# Patient Record
Sex: Male | Born: 1935
Health system: Southern US, Community
[De-identification: ages and names within clinical notes are randomized; demographics above are authoritative.]

## PROBLEM LIST (undated history)

## (undated) DIAGNOSIS — H348392 Tributary (branch) retinal vein occlusion, unspecified eye, stable: Secondary | ICD-10-CM

## (undated) DIAGNOSIS — I6930 Unspecified sequelae of cerebral infarction: Secondary | ICD-10-CM

## (undated) DIAGNOSIS — C61 Malignant neoplasm of prostate: Secondary | ICD-10-CM

## (undated) DIAGNOSIS — M51369 Other intervertebral disc degeneration, lumbar region without mention of lumbar back pain or lower extremity pain: Secondary | ICD-10-CM

## (undated) DIAGNOSIS — E041 Nontoxic single thyroid nodule: Secondary | ICD-10-CM

## (undated) DIAGNOSIS — E538 Deficiency of other specified B group vitamins: Secondary | ICD-10-CM

## (undated) DIAGNOSIS — G4733 Obstructive sleep apnea (adult) (pediatric): Secondary | ICD-10-CM

## (undated) DIAGNOSIS — E785 Hyperlipidemia, unspecified: Secondary | ICD-10-CM

## (undated) DIAGNOSIS — M19011 Primary osteoarthritis, right shoulder: Secondary | ICD-10-CM

## (undated) DIAGNOSIS — I6990 Unspecified sequelae of unspecified cerebrovascular disease: Secondary | ICD-10-CM

## (undated) DIAGNOSIS — R600 Localized edema: Secondary | ICD-10-CM

## (undated) DIAGNOSIS — H409 Unspecified glaucoma: Secondary | ICD-10-CM

## (undated) DIAGNOSIS — Z8546 Personal history of malignant neoplasm of prostate: Secondary | ICD-10-CM

## (undated) DIAGNOSIS — M5136 Other intervertebral disc degeneration, lumbar region: Secondary | ICD-10-CM

## (undated) HISTORY — DX: Primary osteoarthritis, right shoulder: M19.011

## (undated) HISTORY — DX: Malignant neoplasm of prostate: C61

## (undated) HISTORY — DX: Localized edema: R60.0

## (undated) HISTORY — DX: Other intervertebral disc degeneration, lumbar region without mention of lumbar back pain or lower extremity pain: M51.369

## (undated) HISTORY — DX: Unspecified sequelae of unspecified cerebrovascular disease: I69.90

## (undated) HISTORY — DX: Other intervertebral disc degeneration, lumbar region: M51.36

## (undated) HISTORY — DX: Nontoxic single thyroid nodule: E04.1

## (undated) HISTORY — DX: Deficiency of other specified B group vitamins: E53.8

## (undated) HISTORY — DX: Unspecified sequelae of cerebral infarction: I69.30

## (undated) HISTORY — PX: CATARACT EXTRACTION: SUR2

---
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
## 1999-01-11 HISTORY — PX: PROSTATECTOMY: SHX69

## 2005-08-12 DIAGNOSIS — I471 Supraventricular tachycardia, unspecified: Secondary | ICD-10-CM

## 2005-08-12 HISTORY — DX: Supraventricular tachycardia, unspecified: I47.10

## 2005-08-12 HISTORY — DX: Supraventricular tachycardia: I47.1

## 2010-04-01 HISTORY — PX: VIDEO ASSISTED THORACOSCOPY (VATS)/DECORTICATION: SHX6171

## 2010-07-17 ENCOUNTER — Inpatient Hospital Stay (HOSPITAL_COMMUNITY)
Admission: AD | Admit: 2010-07-17 | Payer: Medicare Other | Source: Other Acute Inpatient Hospital | Admitting: Cardiology

## 2012-07-16 DIAGNOSIS — H3581 Retinal edema: Secondary | ICD-10-CM

## 2012-07-16 DIAGNOSIS — Z961 Presence of intraocular lens: Secondary | ICD-10-CM

## 2012-07-16 DIAGNOSIS — H401132 Primary open-angle glaucoma, bilateral, moderate stage: Secondary | ICD-10-CM

## 2012-07-16 HISTORY — DX: Presence of intraocular lens: Z96.1

## 2012-07-16 HISTORY — DX: Retinal edema: H35.81

## 2012-07-16 HISTORY — DX: Primary open-angle glaucoma, bilateral, moderate stage: H40.1132

## 2013-12-09 DIAGNOSIS — H20023 Recurrent acute iridocyclitis, bilateral: Secondary | ICD-10-CM

## 2013-12-09 HISTORY — DX: Recurrent acute iridocyclitis, bilateral: H20.023

## 2014-04-05 DIAGNOSIS — Z6825 Body mass index (BMI) 25.0-25.9, adult: Secondary | ICD-10-CM | POA: Diagnosis not present

## 2014-04-05 DIAGNOSIS — I10 Essential (primary) hypertension: Secondary | ICD-10-CM | POA: Diagnosis not present

## 2014-04-07 DIAGNOSIS — H20023 Recurrent acute iridocyclitis, bilateral: Secondary | ICD-10-CM | POA: Diagnosis not present

## 2014-04-07 DIAGNOSIS — H3581 Retinal edema: Secondary | ICD-10-CM | POA: Diagnosis not present

## 2014-04-07 DIAGNOSIS — D3131 Benign neoplasm of right choroid: Secondary | ICD-10-CM | POA: Diagnosis not present

## 2014-04-07 DIAGNOSIS — Z961 Presence of intraocular lens: Secondary | ICD-10-CM | POA: Diagnosis not present

## 2014-04-07 DIAGNOSIS — H34832 Tributary (branch) retinal vein occlusion, left eye: Secondary | ICD-10-CM | POA: Diagnosis not present

## 2014-05-09 DIAGNOSIS — I10 Essential (primary) hypertension: Secondary | ICD-10-CM | POA: Diagnosis not present

## 2014-05-09 DIAGNOSIS — I471 Supraventricular tachycardia: Secondary | ICD-10-CM | POA: Diagnosis not present

## 2014-05-09 DIAGNOSIS — Z79899 Other long term (current) drug therapy: Secondary | ICD-10-CM | POA: Diagnosis not present

## 2014-05-09 DIAGNOSIS — I639 Cerebral infarction, unspecified: Secondary | ICD-10-CM | POA: Diagnosis not present

## 2014-05-09 DIAGNOSIS — E785 Hyperlipidemia, unspecified: Secondary | ICD-10-CM | POA: Diagnosis not present

## 2014-06-09 DIAGNOSIS — Z961 Presence of intraocular lens: Secondary | ICD-10-CM | POA: Diagnosis not present

## 2014-06-09 DIAGNOSIS — D3131 Benign neoplasm of right choroid: Secondary | ICD-10-CM | POA: Diagnosis not present

## 2014-06-09 DIAGNOSIS — H3581 Retinal edema: Secondary | ICD-10-CM | POA: Diagnosis not present

## 2014-06-09 DIAGNOSIS — H34832 Tributary (branch) retinal vein occlusion, left eye: Secondary | ICD-10-CM | POA: Diagnosis not present

## 2014-06-09 DIAGNOSIS — H20023 Recurrent acute iridocyclitis, bilateral: Secondary | ICD-10-CM | POA: Diagnosis not present

## 2014-06-22 DIAGNOSIS — Z961 Presence of intraocular lens: Secondary | ICD-10-CM | POA: Diagnosis not present

## 2014-06-22 DIAGNOSIS — H4011X0 Primary open-angle glaucoma, stage unspecified: Secondary | ICD-10-CM | POA: Diagnosis not present

## 2014-06-22 DIAGNOSIS — D3131 Benign neoplasm of right choroid: Secondary | ICD-10-CM | POA: Diagnosis not present

## 2014-08-10 DIAGNOSIS — I471 Supraventricular tachycardia: Secondary | ICD-10-CM | POA: Diagnosis not present

## 2014-08-10 DIAGNOSIS — E785 Hyperlipidemia, unspecified: Secondary | ICD-10-CM | POA: Diagnosis not present

## 2014-08-10 DIAGNOSIS — I639 Cerebral infarction, unspecified: Secondary | ICD-10-CM | POA: Diagnosis not present

## 2014-08-10 DIAGNOSIS — Z1389 Encounter for screening for other disorder: Secondary | ICD-10-CM | POA: Diagnosis not present

## 2014-08-10 DIAGNOSIS — I10 Essential (primary) hypertension: Secondary | ICD-10-CM | POA: Diagnosis not present

## 2014-08-10 DIAGNOSIS — Z9181 History of falling: Secondary | ICD-10-CM | POA: Diagnosis not present

## 2014-08-10 DIAGNOSIS — Z79899 Other long term (current) drug therapy: Secondary | ICD-10-CM | POA: Diagnosis not present

## 2014-08-29 DIAGNOSIS — N401 Enlarged prostate with lower urinary tract symptoms: Secondary | ICD-10-CM | POA: Diagnosis not present

## 2014-08-29 DIAGNOSIS — R972 Elevated prostate specific antigen [PSA]: Secondary | ICD-10-CM | POA: Diagnosis not present

## 2014-08-29 DIAGNOSIS — N3090 Cystitis, unspecified without hematuria: Secondary | ICD-10-CM | POA: Diagnosis not present

## 2014-09-09 DIAGNOSIS — R972 Elevated prostate specific antigen [PSA]: Secondary | ICD-10-CM | POA: Diagnosis not present

## 2014-09-19 DIAGNOSIS — Z6824 Body mass index (BMI) 24.0-24.9, adult: Secondary | ICD-10-CM | POA: Diagnosis not present

## 2014-09-19 DIAGNOSIS — J208 Acute bronchitis due to other specified organisms: Secondary | ICD-10-CM | POA: Diagnosis not present

## 2014-09-19 DIAGNOSIS — R0981 Nasal congestion: Secondary | ICD-10-CM | POA: Diagnosis not present

## 2014-09-29 DIAGNOSIS — Z961 Presence of intraocular lens: Secondary | ICD-10-CM | POA: Diagnosis not present

## 2014-09-29 DIAGNOSIS — H20023 Recurrent acute iridocyclitis, bilateral: Secondary | ICD-10-CM | POA: Diagnosis not present

## 2014-09-29 DIAGNOSIS — H3581 Retinal edema: Secondary | ICD-10-CM | POA: Diagnosis not present

## 2014-09-29 DIAGNOSIS — H34832 Tributary (branch) retinal vein occlusion, left eye: Secondary | ICD-10-CM | POA: Diagnosis not present

## 2014-09-29 DIAGNOSIS — D3131 Benign neoplasm of right choroid: Secondary | ICD-10-CM | POA: Diagnosis not present

## 2014-10-13 DIAGNOSIS — Z6823 Body mass index (BMI) 23.0-23.9, adult: Secondary | ICD-10-CM | POA: Diagnosis not present

## 2014-10-13 DIAGNOSIS — K645 Perianal venous thrombosis: Secondary | ICD-10-CM | POA: Diagnosis not present

## 2014-10-19 DIAGNOSIS — K625 Hemorrhage of anus and rectum: Secondary | ICD-10-CM | POA: Diagnosis not present

## 2014-10-19 DIAGNOSIS — K645 Perianal venous thrombosis: Secondary | ICD-10-CM | POA: Diagnosis not present

## 2014-10-19 DIAGNOSIS — K644 Residual hemorrhoidal skin tags: Secondary | ICD-10-CM | POA: Diagnosis not present

## 2014-10-19 DIAGNOSIS — K6289 Other specified diseases of anus and rectum: Secondary | ICD-10-CM | POA: Diagnosis not present

## 2014-10-26 DIAGNOSIS — D3131 Benign neoplasm of right choroid: Secondary | ICD-10-CM | POA: Diagnosis not present

## 2014-10-26 DIAGNOSIS — H4011X3 Primary open-angle glaucoma, severe stage: Secondary | ICD-10-CM | POA: Diagnosis not present

## 2014-10-26 DIAGNOSIS — Z961 Presence of intraocular lens: Secondary | ICD-10-CM | POA: Diagnosis not present

## 2014-10-26 DIAGNOSIS — H35372 Puckering of macula, left eye: Secondary | ICD-10-CM | POA: Diagnosis not present

## 2014-11-10 DIAGNOSIS — E785 Hyperlipidemia, unspecified: Secondary | ICD-10-CM | POA: Diagnosis not present

## 2014-11-10 DIAGNOSIS — I639 Cerebral infarction, unspecified: Secondary | ICD-10-CM | POA: Diagnosis not present

## 2014-11-10 DIAGNOSIS — I10 Essential (primary) hypertension: Secondary | ICD-10-CM | POA: Diagnosis not present

## 2014-11-10 DIAGNOSIS — I471 Supraventricular tachycardia: Secondary | ICD-10-CM | POA: Diagnosis not present

## 2014-11-10 DIAGNOSIS — Z1389 Encounter for screening for other disorder: Secondary | ICD-10-CM | POA: Diagnosis not present

## 2014-11-10 DIAGNOSIS — Z79899 Other long term (current) drug therapy: Secondary | ICD-10-CM | POA: Diagnosis not present

## 2014-11-16 DIAGNOSIS — K625 Hemorrhage of anus and rectum: Secondary | ICD-10-CM | POA: Diagnosis not present

## 2014-11-16 DIAGNOSIS — I10 Essential (primary) hypertension: Secondary | ICD-10-CM | POA: Diagnosis not present

## 2014-11-16 DIAGNOSIS — K644 Residual hemorrhoidal skin tags: Secondary | ICD-10-CM | POA: Diagnosis not present

## 2014-11-16 DIAGNOSIS — K6289 Other specified diseases of anus and rectum: Secondary | ICD-10-CM | POA: Diagnosis not present

## 2014-11-21 DIAGNOSIS — K649 Unspecified hemorrhoids: Secondary | ICD-10-CM | POA: Diagnosis not present

## 2014-11-21 DIAGNOSIS — Z79899 Other long term (current) drug therapy: Secondary | ICD-10-CM | POA: Diagnosis not present

## 2014-11-21 DIAGNOSIS — I517 Cardiomegaly: Secondary | ICD-10-CM | POA: Diagnosis not present

## 2014-11-21 DIAGNOSIS — K642 Third degree hemorrhoids: Secondary | ICD-10-CM | POA: Diagnosis not present

## 2014-11-21 DIAGNOSIS — I10 Essential (primary) hypertension: Secondary | ICD-10-CM | POA: Diagnosis not present

## 2014-11-21 DIAGNOSIS — Z7982 Long term (current) use of aspirin: Secondary | ICD-10-CM | POA: Diagnosis not present

## 2014-11-21 HISTORY — PX: HEMORROIDECTOMY: SUR656

## 2015-01-02 DIAGNOSIS — Z23 Encounter for immunization: Secondary | ICD-10-CM | POA: Diagnosis not present

## 2015-02-09 DIAGNOSIS — I471 Supraventricular tachycardia: Secondary | ICD-10-CM | POA: Diagnosis not present

## 2015-02-09 DIAGNOSIS — Z79899 Other long term (current) drug therapy: Secondary | ICD-10-CM | POA: Diagnosis not present

## 2015-02-09 DIAGNOSIS — E785 Hyperlipidemia, unspecified: Secondary | ICD-10-CM | POA: Diagnosis not present

## 2015-02-09 DIAGNOSIS — I639 Cerebral infarction, unspecified: Secondary | ICD-10-CM | POA: Diagnosis not present

## 2015-02-09 DIAGNOSIS — I10 Essential (primary) hypertension: Secondary | ICD-10-CM | POA: Diagnosis not present

## 2015-03-03 DIAGNOSIS — N3020 Other chronic cystitis without hematuria: Secondary | ICD-10-CM | POA: Diagnosis not present

## 2015-03-03 DIAGNOSIS — N402 Nodular prostate without lower urinary tract symptoms: Secondary | ICD-10-CM | POA: Diagnosis not present

## 2015-03-03 DIAGNOSIS — N401 Enlarged prostate with lower urinary tract symptoms: Secondary | ICD-10-CM | POA: Diagnosis not present

## 2015-03-03 DIAGNOSIS — R972 Elevated prostate specific antigen [PSA]: Secondary | ICD-10-CM | POA: Diagnosis not present

## 2015-04-06 DIAGNOSIS — H20023 Recurrent acute iridocyclitis, bilateral: Secondary | ICD-10-CM | POA: Diagnosis not present

## 2015-04-06 DIAGNOSIS — H3581 Retinal edema: Secondary | ICD-10-CM | POA: Diagnosis not present

## 2015-04-06 DIAGNOSIS — D3131 Benign neoplasm of right choroid: Secondary | ICD-10-CM | POA: Diagnosis not present

## 2015-04-06 DIAGNOSIS — H348392 Tributary (branch) retinal vein occlusion, unspecified eye, stable: Secondary | ICD-10-CM | POA: Diagnosis not present

## 2015-04-06 DIAGNOSIS — Z961 Presence of intraocular lens: Secondary | ICD-10-CM | POA: Diagnosis not present

## 2015-05-12 DIAGNOSIS — Z961 Presence of intraocular lens: Secondary | ICD-10-CM | POA: Diagnosis not present

## 2015-05-12 DIAGNOSIS — D3131 Benign neoplasm of right choroid: Secondary | ICD-10-CM | POA: Diagnosis not present

## 2015-05-12 DIAGNOSIS — H35372 Puckering of macula, left eye: Secondary | ICD-10-CM | POA: Diagnosis not present

## 2015-05-12 DIAGNOSIS — H401133 Primary open-angle glaucoma, bilateral, severe stage: Secondary | ICD-10-CM | POA: Diagnosis not present

## 2015-05-18 DIAGNOSIS — I10 Essential (primary) hypertension: Secondary | ICD-10-CM | POA: Diagnosis not present

## 2015-05-18 DIAGNOSIS — I471 Supraventricular tachycardia: Secondary | ICD-10-CM | POA: Diagnosis not present

## 2015-05-18 DIAGNOSIS — E785 Hyperlipidemia, unspecified: Secondary | ICD-10-CM | POA: Diagnosis not present

## 2015-05-18 DIAGNOSIS — M25511 Pain in right shoulder: Secondary | ICD-10-CM | POA: Diagnosis not present

## 2015-05-18 DIAGNOSIS — I639 Cerebral infarction, unspecified: Secondary | ICD-10-CM | POA: Diagnosis not present

## 2015-05-18 DIAGNOSIS — Z79899 Other long term (current) drug therapy: Secondary | ICD-10-CM | POA: Diagnosis not present

## 2015-05-24 DIAGNOSIS — M19011 Primary osteoarthritis, right shoulder: Secondary | ICD-10-CM | POA: Diagnosis not present

## 2015-06-15 DIAGNOSIS — Z6824 Body mass index (BMI) 24.0-24.9, adult: Secondary | ICD-10-CM | POA: Diagnosis not present

## 2015-06-15 DIAGNOSIS — I471 Supraventricular tachycardia: Secondary | ICD-10-CM | POA: Diagnosis not present

## 2015-06-15 DIAGNOSIS — Z0000 Encounter for general adult medical examination without abnormal findings: Secondary | ICD-10-CM | POA: Diagnosis not present

## 2015-06-15 DIAGNOSIS — E785 Hyperlipidemia, unspecified: Secondary | ICD-10-CM | POA: Diagnosis not present

## 2015-06-15 DIAGNOSIS — I10 Essential (primary) hypertension: Secondary | ICD-10-CM | POA: Diagnosis not present

## 2015-07-26 DIAGNOSIS — G4733 Obstructive sleep apnea (adult) (pediatric): Secondary | ICD-10-CM | POA: Diagnosis not present

## 2015-07-26 DIAGNOSIS — I471 Supraventricular tachycardia: Secondary | ICD-10-CM | POA: Diagnosis not present

## 2015-07-26 DIAGNOSIS — Z6824 Body mass index (BMI) 24.0-24.9, adult: Secondary | ICD-10-CM | POA: Diagnosis not present

## 2015-07-26 DIAGNOSIS — N401 Enlarged prostate with lower urinary tract symptoms: Secondary | ICD-10-CM | POA: Diagnosis not present

## 2015-08-07 DIAGNOSIS — G4733 Obstructive sleep apnea (adult) (pediatric): Secondary | ICD-10-CM | POA: Diagnosis not present

## 2015-08-15 DIAGNOSIS — J324 Chronic pansinusitis: Secondary | ICD-10-CM | POA: Diagnosis not present

## 2015-08-15 DIAGNOSIS — I639 Cerebral infarction, unspecified: Secondary | ICD-10-CM | POA: Diagnosis not present

## 2015-08-15 DIAGNOSIS — I10 Essential (primary) hypertension: Secondary | ICD-10-CM | POA: Diagnosis not present

## 2015-08-15 DIAGNOSIS — E785 Hyperlipidemia, unspecified: Secondary | ICD-10-CM | POA: Diagnosis not present

## 2015-08-15 DIAGNOSIS — Z79899 Other long term (current) drug therapy: Secondary | ICD-10-CM | POA: Diagnosis not present

## 2015-08-15 DIAGNOSIS — I471 Supraventricular tachycardia: Secondary | ICD-10-CM | POA: Diagnosis not present

## 2015-08-29 DIAGNOSIS — R972 Elevated prostate specific antigen [PSA]: Secondary | ICD-10-CM | POA: Diagnosis not present

## 2015-08-29 DIAGNOSIS — N3020 Other chronic cystitis without hematuria: Secondary | ICD-10-CM | POA: Diagnosis not present

## 2015-08-29 DIAGNOSIS — N402 Nodular prostate without lower urinary tract symptoms: Secondary | ICD-10-CM | POA: Diagnosis not present

## 2015-08-29 DIAGNOSIS — N318 Other neuromuscular dysfunction of bladder: Secondary | ICD-10-CM | POA: Diagnosis not present

## 2015-08-29 DIAGNOSIS — N401 Enlarged prostate with lower urinary tract symptoms: Secondary | ICD-10-CM | POA: Diagnosis not present

## 2015-08-31 DIAGNOSIS — G4733 Obstructive sleep apnea (adult) (pediatric): Secondary | ICD-10-CM | POA: Diagnosis not present

## 2015-09-04 DIAGNOSIS — G4733 Obstructive sleep apnea (adult) (pediatric): Secondary | ICD-10-CM | POA: Diagnosis not present

## 2015-09-04 DIAGNOSIS — R0683 Snoring: Secondary | ICD-10-CM | POA: Diagnosis not present

## 2015-09-18 DIAGNOSIS — G4733 Obstructive sleep apnea (adult) (pediatric): Secondary | ICD-10-CM | POA: Diagnosis not present

## 2015-10-02 DIAGNOSIS — G4733 Obstructive sleep apnea (adult) (pediatric): Secondary | ICD-10-CM | POA: Diagnosis not present

## 2015-10-12 DIAGNOSIS — H348392 Tributary (branch) retinal vein occlusion, unspecified eye, stable: Secondary | ICD-10-CM | POA: Diagnosis not present

## 2015-10-12 DIAGNOSIS — H3581 Retinal edema: Secondary | ICD-10-CM | POA: Diagnosis not present

## 2015-10-12 DIAGNOSIS — Z961 Presence of intraocular lens: Secondary | ICD-10-CM | POA: Diagnosis not present

## 2015-10-12 DIAGNOSIS — D3131 Benign neoplasm of right choroid: Secondary | ICD-10-CM | POA: Diagnosis not present

## 2015-10-30 DIAGNOSIS — G4733 Obstructive sleep apnea (adult) (pediatric): Secondary | ICD-10-CM | POA: Diagnosis not present

## 2015-10-30 DIAGNOSIS — R0683 Snoring: Secondary | ICD-10-CM | POA: Diagnosis not present

## 2015-11-02 DIAGNOSIS — G4733 Obstructive sleep apnea (adult) (pediatric): Secondary | ICD-10-CM | POA: Diagnosis not present

## 2015-11-10 DIAGNOSIS — H401133 Primary open-angle glaucoma, bilateral, severe stage: Secondary | ICD-10-CM | POA: Diagnosis not present

## 2015-11-21 DIAGNOSIS — I639 Cerebral infarction, unspecified: Secondary | ICD-10-CM | POA: Diagnosis not present

## 2015-11-21 DIAGNOSIS — I471 Supraventricular tachycardia: Secondary | ICD-10-CM | POA: Diagnosis not present

## 2015-11-21 DIAGNOSIS — I10 Essential (primary) hypertension: Secondary | ICD-10-CM | POA: Diagnosis not present

## 2015-11-21 DIAGNOSIS — Z1389 Encounter for screening for other disorder: Secondary | ICD-10-CM | POA: Diagnosis not present

## 2015-11-21 DIAGNOSIS — Z79899 Other long term (current) drug therapy: Secondary | ICD-10-CM | POA: Diagnosis not present

## 2015-11-21 DIAGNOSIS — E785 Hyperlipidemia, unspecified: Secondary | ICD-10-CM | POA: Diagnosis not present

## 2015-11-21 DIAGNOSIS — Z9181 History of falling: Secondary | ICD-10-CM | POA: Diagnosis not present

## 2015-11-27 DIAGNOSIS — N3020 Other chronic cystitis without hematuria: Secondary | ICD-10-CM | POA: Diagnosis not present

## 2015-11-27 DIAGNOSIS — N402 Nodular prostate without lower urinary tract symptoms: Secondary | ICD-10-CM | POA: Diagnosis not present

## 2015-11-27 DIAGNOSIS — N318 Other neuromuscular dysfunction of bladder: Secondary | ICD-10-CM | POA: Diagnosis not present

## 2015-11-27 DIAGNOSIS — R972 Elevated prostate specific antigen [PSA]: Secondary | ICD-10-CM | POA: Diagnosis not present

## 2015-12-03 DIAGNOSIS — G4733 Obstructive sleep apnea (adult) (pediatric): Secondary | ICD-10-CM | POA: Diagnosis not present

## 2015-12-05 DIAGNOSIS — N402 Nodular prostate without lower urinary tract symptoms: Secondary | ICD-10-CM | POA: Diagnosis not present

## 2015-12-05 DIAGNOSIS — N401 Enlarged prostate with lower urinary tract symptoms: Secondary | ICD-10-CM | POA: Diagnosis not present

## 2015-12-05 DIAGNOSIS — R972 Elevated prostate specific antigen [PSA]: Secondary | ICD-10-CM | POA: Diagnosis not present

## 2015-12-05 DIAGNOSIS — C61 Malignant neoplasm of prostate: Secondary | ICD-10-CM | POA: Diagnosis not present

## 2015-12-06 DIAGNOSIS — G4733 Obstructive sleep apnea (adult) (pediatric): Secondary | ICD-10-CM | POA: Diagnosis not present

## 2015-12-12 DIAGNOSIS — C61 Malignant neoplasm of prostate: Secondary | ICD-10-CM | POA: Diagnosis not present

## 2015-12-15 DIAGNOSIS — C61 Malignant neoplasm of prostate: Secondary | ICD-10-CM | POA: Diagnosis not present

## 2015-12-15 DIAGNOSIS — I700 Atherosclerosis of aorta: Secondary | ICD-10-CM | POA: Diagnosis not present

## 2015-12-15 DIAGNOSIS — N281 Cyst of kidney, acquired: Secondary | ICD-10-CM | POA: Diagnosis not present

## 2015-12-15 DIAGNOSIS — K7689 Other specified diseases of liver: Secondary | ICD-10-CM | POA: Diagnosis not present

## 2015-12-18 DIAGNOSIS — C61 Malignant neoplasm of prostate: Secondary | ICD-10-CM | POA: Diagnosis not present

## 2015-12-25 DIAGNOSIS — G4733 Obstructive sleep apnea (adult) (pediatric): Secondary | ICD-10-CM | POA: Diagnosis not present

## 2015-12-25 DIAGNOSIS — R5383 Other fatigue: Secondary | ICD-10-CM | POA: Diagnosis not present

## 2015-12-26 DIAGNOSIS — G4733 Obstructive sleep apnea (adult) (pediatric): Secondary | ICD-10-CM | POA: Diagnosis not present

## 2015-12-27 DIAGNOSIS — C61 Malignant neoplasm of prostate: Secondary | ICD-10-CM | POA: Diagnosis not present

## 2016-01-01 DIAGNOSIS — C61 Malignant neoplasm of prostate: Secondary | ICD-10-CM | POA: Diagnosis not present

## 2016-01-02 DIAGNOSIS — G4733 Obstructive sleep apnea (adult) (pediatric): Secondary | ICD-10-CM | POA: Diagnosis not present

## 2016-02-02 DIAGNOSIS — G4733 Obstructive sleep apnea (adult) (pediatric): Secondary | ICD-10-CM | POA: Diagnosis not present

## 2016-02-12 DIAGNOSIS — C61 Malignant neoplasm of prostate: Secondary | ICD-10-CM | POA: Diagnosis not present

## 2016-02-12 DIAGNOSIS — Z510 Encounter for antineoplastic radiation therapy: Secondary | ICD-10-CM | POA: Diagnosis not present

## 2016-02-14 DIAGNOSIS — C61 Malignant neoplasm of prostate: Secondary | ICD-10-CM | POA: Diagnosis not present

## 2016-02-14 DIAGNOSIS — Z510 Encounter for antineoplastic radiation therapy: Secondary | ICD-10-CM | POA: Diagnosis not present

## 2016-02-15 DIAGNOSIS — C61 Malignant neoplasm of prostate: Secondary | ICD-10-CM | POA: Diagnosis not present

## 2016-02-19 DIAGNOSIS — C61 Malignant neoplasm of prostate: Secondary | ICD-10-CM | POA: Diagnosis not present

## 2016-02-19 DIAGNOSIS — Z510 Encounter for antineoplastic radiation therapy: Secondary | ICD-10-CM | POA: Diagnosis not present

## 2016-02-26 DIAGNOSIS — Z510 Encounter for antineoplastic radiation therapy: Secondary | ICD-10-CM | POA: Diagnosis not present

## 2016-02-26 DIAGNOSIS — C61 Malignant neoplasm of prostate: Secondary | ICD-10-CM | POA: Diagnosis not present

## 2016-02-27 DIAGNOSIS — Z510 Encounter for antineoplastic radiation therapy: Secondary | ICD-10-CM | POA: Diagnosis not present

## 2016-02-27 DIAGNOSIS — C61 Malignant neoplasm of prostate: Secondary | ICD-10-CM | POA: Diagnosis not present

## 2016-02-28 DIAGNOSIS — Z23 Encounter for immunization: Secondary | ICD-10-CM | POA: Diagnosis not present

## 2016-02-28 DIAGNOSIS — I10 Essential (primary) hypertension: Secondary | ICD-10-CM | POA: Diagnosis not present

## 2016-02-28 DIAGNOSIS — C61 Malignant neoplasm of prostate: Secondary | ICD-10-CM | POA: Diagnosis not present

## 2016-02-28 DIAGNOSIS — Z510 Encounter for antineoplastic radiation therapy: Secondary | ICD-10-CM | POA: Diagnosis not present

## 2016-02-28 DIAGNOSIS — E785 Hyperlipidemia, unspecified: Secondary | ICD-10-CM | POA: Diagnosis not present

## 2016-02-28 DIAGNOSIS — I471 Supraventricular tachycardia: Secondary | ICD-10-CM | POA: Diagnosis not present

## 2016-02-28 DIAGNOSIS — I639 Cerebral infarction, unspecified: Secondary | ICD-10-CM | POA: Diagnosis not present

## 2016-02-28 DIAGNOSIS — Z79899 Other long term (current) drug therapy: Secondary | ICD-10-CM | POA: Diagnosis not present

## 2016-02-29 DIAGNOSIS — Z510 Encounter for antineoplastic radiation therapy: Secondary | ICD-10-CM | POA: Diagnosis not present

## 2016-02-29 DIAGNOSIS — C61 Malignant neoplasm of prostate: Secondary | ICD-10-CM | POA: Diagnosis not present

## 2016-03-01 DIAGNOSIS — Z510 Encounter for antineoplastic radiation therapy: Secondary | ICD-10-CM | POA: Diagnosis not present

## 2016-03-01 DIAGNOSIS — C61 Malignant neoplasm of prostate: Secondary | ICD-10-CM | POA: Diagnosis not present

## 2016-03-03 DIAGNOSIS — G4733 Obstructive sleep apnea (adult) (pediatric): Secondary | ICD-10-CM | POA: Diagnosis not present

## 2016-03-04 DIAGNOSIS — C61 Malignant neoplasm of prostate: Secondary | ICD-10-CM | POA: Diagnosis not present

## 2016-03-04 DIAGNOSIS — Z510 Encounter for antineoplastic radiation therapy: Secondary | ICD-10-CM | POA: Diagnosis not present

## 2016-03-05 DIAGNOSIS — C61 Malignant neoplasm of prostate: Secondary | ICD-10-CM | POA: Diagnosis not present

## 2016-03-05 DIAGNOSIS — Z510 Encounter for antineoplastic radiation therapy: Secondary | ICD-10-CM | POA: Diagnosis not present

## 2016-03-06 DIAGNOSIS — Z510 Encounter for antineoplastic radiation therapy: Secondary | ICD-10-CM | POA: Diagnosis not present

## 2016-03-06 DIAGNOSIS — C61 Malignant neoplasm of prostate: Secondary | ICD-10-CM | POA: Diagnosis not present

## 2016-03-07 DIAGNOSIS — C61 Malignant neoplasm of prostate: Secondary | ICD-10-CM | POA: Diagnosis not present

## 2016-03-07 DIAGNOSIS — Z510 Encounter for antineoplastic radiation therapy: Secondary | ICD-10-CM | POA: Diagnosis not present

## 2016-03-08 DIAGNOSIS — Z510 Encounter for antineoplastic radiation therapy: Secondary | ICD-10-CM | POA: Diagnosis not present

## 2016-03-08 DIAGNOSIS — C61 Malignant neoplasm of prostate: Secondary | ICD-10-CM | POA: Diagnosis not present

## 2016-03-11 DIAGNOSIS — Z510 Encounter for antineoplastic radiation therapy: Secondary | ICD-10-CM | POA: Diagnosis not present

## 2016-03-11 DIAGNOSIS — C61 Malignant neoplasm of prostate: Secondary | ICD-10-CM | POA: Diagnosis not present

## 2016-03-12 DIAGNOSIS — C61 Malignant neoplasm of prostate: Secondary | ICD-10-CM | POA: Diagnosis not present

## 2016-03-12 DIAGNOSIS — Z510 Encounter for antineoplastic radiation therapy: Secondary | ICD-10-CM | POA: Diagnosis not present

## 2016-03-13 DIAGNOSIS — Z510 Encounter for antineoplastic radiation therapy: Secondary | ICD-10-CM | POA: Diagnosis not present

## 2016-03-13 DIAGNOSIS — C61 Malignant neoplasm of prostate: Secondary | ICD-10-CM | POA: Diagnosis not present

## 2016-03-13 DIAGNOSIS — I10 Essential (primary) hypertension: Secondary | ICD-10-CM | POA: Diagnosis not present

## 2016-03-14 DIAGNOSIS — C61 Malignant neoplasm of prostate: Secondary | ICD-10-CM | POA: Diagnosis not present

## 2016-03-14 DIAGNOSIS — Z510 Encounter for antineoplastic radiation therapy: Secondary | ICD-10-CM | POA: Diagnosis not present

## 2016-03-15 DIAGNOSIS — Z510 Encounter for antineoplastic radiation therapy: Secondary | ICD-10-CM | POA: Diagnosis not present

## 2016-03-15 DIAGNOSIS — C61 Malignant neoplasm of prostate: Secondary | ICD-10-CM | POA: Diagnosis not present

## 2016-03-16 DIAGNOSIS — G4733 Obstructive sleep apnea (adult) (pediatric): Secondary | ICD-10-CM | POA: Diagnosis not present

## 2016-03-18 DIAGNOSIS — C61 Malignant neoplasm of prostate: Secondary | ICD-10-CM | POA: Diagnosis not present

## 2016-03-18 DIAGNOSIS — Z510 Encounter for antineoplastic radiation therapy: Secondary | ICD-10-CM | POA: Diagnosis not present

## 2016-03-19 DIAGNOSIS — C61 Malignant neoplasm of prostate: Secondary | ICD-10-CM | POA: Diagnosis not present

## 2016-03-19 DIAGNOSIS — Z510 Encounter for antineoplastic radiation therapy: Secondary | ICD-10-CM | POA: Diagnosis not present

## 2016-03-20 DIAGNOSIS — C61 Malignant neoplasm of prostate: Secondary | ICD-10-CM | POA: Diagnosis not present

## 2016-03-20 DIAGNOSIS — Z510 Encounter for antineoplastic radiation therapy: Secondary | ICD-10-CM | POA: Diagnosis not present

## 2016-03-21 DIAGNOSIS — C61 Malignant neoplasm of prostate: Secondary | ICD-10-CM | POA: Diagnosis not present

## 2016-03-21 DIAGNOSIS — Z510 Encounter for antineoplastic radiation therapy: Secondary | ICD-10-CM | POA: Diagnosis not present

## 2016-03-22 DIAGNOSIS — C61 Malignant neoplasm of prostate: Secondary | ICD-10-CM | POA: Diagnosis not present

## 2016-03-22 DIAGNOSIS — Z510 Encounter for antineoplastic radiation therapy: Secondary | ICD-10-CM | POA: Diagnosis not present

## 2016-03-26 DIAGNOSIS — C61 Malignant neoplasm of prostate: Secondary | ICD-10-CM | POA: Diagnosis not present

## 2016-03-26 DIAGNOSIS — Z510 Encounter for antineoplastic radiation therapy: Secondary | ICD-10-CM | POA: Diagnosis not present

## 2016-03-27 DIAGNOSIS — C61 Malignant neoplasm of prostate: Secondary | ICD-10-CM | POA: Diagnosis not present

## 2016-03-27 DIAGNOSIS — Z510 Encounter for antineoplastic radiation therapy: Secondary | ICD-10-CM | POA: Diagnosis not present

## 2016-03-28 DIAGNOSIS — Z510 Encounter for antineoplastic radiation therapy: Secondary | ICD-10-CM | POA: Diagnosis not present

## 2016-03-28 DIAGNOSIS — C61 Malignant neoplasm of prostate: Secondary | ICD-10-CM | POA: Diagnosis not present

## 2016-03-29 DIAGNOSIS — C61 Malignant neoplasm of prostate: Secondary | ICD-10-CM | POA: Diagnosis not present

## 2016-03-29 DIAGNOSIS — Z510 Encounter for antineoplastic radiation therapy: Secondary | ICD-10-CM | POA: Diagnosis not present

## 2016-04-02 DIAGNOSIS — C61 Malignant neoplasm of prostate: Secondary | ICD-10-CM | POA: Diagnosis not present

## 2016-04-02 DIAGNOSIS — Z510 Encounter for antineoplastic radiation therapy: Secondary | ICD-10-CM | POA: Diagnosis not present

## 2016-04-03 DIAGNOSIS — G4733 Obstructive sleep apnea (adult) (pediatric): Secondary | ICD-10-CM | POA: Diagnosis not present

## 2016-04-03 DIAGNOSIS — C61 Malignant neoplasm of prostate: Secondary | ICD-10-CM | POA: Diagnosis not present

## 2016-04-03 DIAGNOSIS — Z510 Encounter for antineoplastic radiation therapy: Secondary | ICD-10-CM | POA: Diagnosis not present

## 2016-04-04 DIAGNOSIS — Z510 Encounter for antineoplastic radiation therapy: Secondary | ICD-10-CM | POA: Diagnosis not present

## 2016-04-04 DIAGNOSIS — C61 Malignant neoplasm of prostate: Secondary | ICD-10-CM | POA: Diagnosis not present

## 2016-04-05 DIAGNOSIS — Z510 Encounter for antineoplastic radiation therapy: Secondary | ICD-10-CM | POA: Diagnosis not present

## 2016-04-05 DIAGNOSIS — C61 Malignant neoplasm of prostate: Secondary | ICD-10-CM | POA: Diagnosis not present

## 2016-04-08 DIAGNOSIS — Z510 Encounter for antineoplastic radiation therapy: Secondary | ICD-10-CM | POA: Diagnosis not present

## 2016-04-08 DIAGNOSIS — C61 Malignant neoplasm of prostate: Secondary | ICD-10-CM | POA: Diagnosis not present

## 2016-04-09 DIAGNOSIS — C61 Malignant neoplasm of prostate: Secondary | ICD-10-CM | POA: Diagnosis not present

## 2016-04-09 DIAGNOSIS — Z510 Encounter for antineoplastic radiation therapy: Secondary | ICD-10-CM | POA: Diagnosis not present

## 2016-04-10 DIAGNOSIS — C61 Malignant neoplasm of prostate: Secondary | ICD-10-CM | POA: Diagnosis not present

## 2016-04-10 DIAGNOSIS — Z510 Encounter for antineoplastic radiation therapy: Secondary | ICD-10-CM | POA: Diagnosis not present

## 2016-04-11 DIAGNOSIS — C61 Malignant neoplasm of prostate: Secondary | ICD-10-CM | POA: Diagnosis not present

## 2016-04-11 DIAGNOSIS — Z510 Encounter for antineoplastic radiation therapy: Secondary | ICD-10-CM | POA: Diagnosis not present

## 2016-04-12 DIAGNOSIS — Z510 Encounter for antineoplastic radiation therapy: Secondary | ICD-10-CM | POA: Diagnosis not present

## 2016-04-12 DIAGNOSIS — C61 Malignant neoplasm of prostate: Secondary | ICD-10-CM | POA: Diagnosis not present

## 2016-04-15 DIAGNOSIS — Z510 Encounter for antineoplastic radiation therapy: Secondary | ICD-10-CM | POA: Diagnosis not present

## 2016-04-15 DIAGNOSIS — C61 Malignant neoplasm of prostate: Secondary | ICD-10-CM | POA: Diagnosis not present

## 2016-04-16 DIAGNOSIS — Z510 Encounter for antineoplastic radiation therapy: Secondary | ICD-10-CM | POA: Diagnosis not present

## 2016-04-16 DIAGNOSIS — C61 Malignant neoplasm of prostate: Secondary | ICD-10-CM | POA: Diagnosis not present

## 2016-04-18 DIAGNOSIS — Z510 Encounter for antineoplastic radiation therapy: Secondary | ICD-10-CM | POA: Diagnosis not present

## 2016-04-18 DIAGNOSIS — C61 Malignant neoplasm of prostate: Secondary | ICD-10-CM | POA: Diagnosis not present

## 2016-04-19 DIAGNOSIS — Z510 Encounter for antineoplastic radiation therapy: Secondary | ICD-10-CM | POA: Diagnosis not present

## 2016-04-19 DIAGNOSIS — C61 Malignant neoplasm of prostate: Secondary | ICD-10-CM | POA: Diagnosis not present

## 2016-04-22 DIAGNOSIS — C61 Malignant neoplasm of prostate: Secondary | ICD-10-CM | POA: Diagnosis not present

## 2016-04-22 DIAGNOSIS — Z510 Encounter for antineoplastic radiation therapy: Secondary | ICD-10-CM | POA: Diagnosis not present

## 2016-04-24 DIAGNOSIS — Z510 Encounter for antineoplastic radiation therapy: Secondary | ICD-10-CM | POA: Diagnosis not present

## 2016-04-24 DIAGNOSIS — C61 Malignant neoplasm of prostate: Secondary | ICD-10-CM | POA: Diagnosis not present

## 2016-04-25 DIAGNOSIS — Z510 Encounter for antineoplastic radiation therapy: Secondary | ICD-10-CM | POA: Diagnosis not present

## 2016-04-25 DIAGNOSIS — C61 Malignant neoplasm of prostate: Secondary | ICD-10-CM | POA: Diagnosis not present

## 2016-04-25 DIAGNOSIS — G4733 Obstructive sleep apnea (adult) (pediatric): Secondary | ICD-10-CM | POA: Diagnosis not present

## 2016-04-27 DIAGNOSIS — Z23 Encounter for immunization: Secondary | ICD-10-CM | POA: Diagnosis not present

## 2016-05-04 DIAGNOSIS — G4733 Obstructive sleep apnea (adult) (pediatric): Secondary | ICD-10-CM | POA: Diagnosis not present

## 2016-05-06 DIAGNOSIS — N3020 Other chronic cystitis without hematuria: Secondary | ICD-10-CM | POA: Diagnosis not present

## 2016-05-06 DIAGNOSIS — C61 Malignant neoplasm of prostate: Secondary | ICD-10-CM | POA: Diagnosis not present

## 2016-05-06 DIAGNOSIS — R600 Localized edema: Secondary | ICD-10-CM | POA: Diagnosis not present

## 2016-05-06 DIAGNOSIS — I10 Essential (primary) hypertension: Secondary | ICD-10-CM | POA: Diagnosis not present

## 2016-05-17 DIAGNOSIS — H401133 Primary open-angle glaucoma, bilateral, severe stage: Secondary | ICD-10-CM | POA: Diagnosis not present

## 2016-05-17 DIAGNOSIS — D3131 Benign neoplasm of right choroid: Secondary | ICD-10-CM | POA: Diagnosis not present

## 2016-05-17 DIAGNOSIS — Z961 Presence of intraocular lens: Secondary | ICD-10-CM | POA: Diagnosis not present

## 2016-06-01 DIAGNOSIS — G4733 Obstructive sleep apnea (adult) (pediatric): Secondary | ICD-10-CM | POA: Diagnosis not present

## 2016-06-06 DIAGNOSIS — C61 Malignant neoplasm of prostate: Secondary | ICD-10-CM | POA: Diagnosis not present

## 2016-06-17 DIAGNOSIS — Z79899 Other long term (current) drug therapy: Secondary | ICD-10-CM | POA: Diagnosis not present

## 2016-06-17 DIAGNOSIS — I10 Essential (primary) hypertension: Secondary | ICD-10-CM | POA: Diagnosis not present

## 2016-06-17 DIAGNOSIS — I639 Cerebral infarction, unspecified: Secondary | ICD-10-CM | POA: Diagnosis not present

## 2016-06-17 DIAGNOSIS — E785 Hyperlipidemia, unspecified: Secondary | ICD-10-CM | POA: Diagnosis not present

## 2016-06-17 DIAGNOSIS — R600 Localized edema: Secondary | ICD-10-CM | POA: Diagnosis not present

## 2016-06-17 DIAGNOSIS — I471 Supraventricular tachycardia: Secondary | ICD-10-CM | POA: Diagnosis not present

## 2016-07-02 DIAGNOSIS — G4733 Obstructive sleep apnea (adult) (pediatric): Secondary | ICD-10-CM | POA: Diagnosis not present

## 2016-07-22 DIAGNOSIS — D638 Anemia in other chronic diseases classified elsewhere: Secondary | ICD-10-CM | POA: Diagnosis not present

## 2016-07-23 DIAGNOSIS — I10 Essential (primary) hypertension: Secondary | ICD-10-CM

## 2016-07-23 DIAGNOSIS — G4733 Obstructive sleep apnea (adult) (pediatric): Secondary | ICD-10-CM | POA: Diagnosis not present

## 2016-07-23 DIAGNOSIS — I499 Cardiac arrhythmia, unspecified: Secondary | ICD-10-CM

## 2016-07-23 HISTORY — DX: Cardiac arrhythmia, unspecified: I49.9

## 2016-07-23 HISTORY — DX: Essential (primary) hypertension: I10

## 2016-07-30 DIAGNOSIS — G4733 Obstructive sleep apnea (adult) (pediatric): Secondary | ICD-10-CM | POA: Diagnosis not present

## 2016-08-20 DIAGNOSIS — I10 Essential (primary) hypertension: Secondary | ICD-10-CM | POA: Diagnosis not present

## 2016-08-20 DIAGNOSIS — G4733 Obstructive sleep apnea (adult) (pediatric): Secondary | ICD-10-CM | POA: Diagnosis not present

## 2016-08-20 DIAGNOSIS — I499 Cardiac arrhythmia, unspecified: Secondary | ICD-10-CM | POA: Diagnosis not present

## 2016-09-06 DIAGNOSIS — N3020 Other chronic cystitis without hematuria: Secondary | ICD-10-CM | POA: Diagnosis not present

## 2016-09-06 DIAGNOSIS — C61 Malignant neoplasm of prostate: Secondary | ICD-10-CM | POA: Diagnosis not present

## 2016-09-23 DIAGNOSIS — I471 Supraventricular tachycardia: Secondary | ICD-10-CM | POA: Diagnosis not present

## 2016-09-23 DIAGNOSIS — Z23 Encounter for immunization: Secondary | ICD-10-CM | POA: Diagnosis not present

## 2016-09-23 DIAGNOSIS — I10 Essential (primary) hypertension: Secondary | ICD-10-CM | POA: Diagnosis not present

## 2016-09-23 DIAGNOSIS — D638 Anemia in other chronic diseases classified elsewhere: Secondary | ICD-10-CM | POA: Diagnosis not present

## 2016-09-23 DIAGNOSIS — E785 Hyperlipidemia, unspecified: Secondary | ICD-10-CM | POA: Diagnosis not present

## 2016-09-23 DIAGNOSIS — I639 Cerebral infarction, unspecified: Secondary | ICD-10-CM | POA: Diagnosis not present

## 2016-10-17 DIAGNOSIS — D3131 Benign neoplasm of right choroid: Secondary | ICD-10-CM | POA: Diagnosis not present

## 2016-10-17 DIAGNOSIS — H34832 Tributary (branch) retinal vein occlusion, left eye, with macular edema: Secondary | ICD-10-CM | POA: Diagnosis not present

## 2016-10-17 DIAGNOSIS — H3581 Retinal edema: Secondary | ICD-10-CM | POA: Diagnosis not present

## 2016-10-17 DIAGNOSIS — H35361 Drusen (degenerative) of macula, right eye: Secondary | ICD-10-CM | POA: Diagnosis not present

## 2016-10-17 DIAGNOSIS — Z961 Presence of intraocular lens: Secondary | ICD-10-CM | POA: Diagnosis not present

## 2016-10-28 DIAGNOSIS — G4733 Obstructive sleep apnea (adult) (pediatric): Secondary | ICD-10-CM | POA: Diagnosis not present

## 2016-11-15 DIAGNOSIS — Z961 Presence of intraocular lens: Secondary | ICD-10-CM | POA: Diagnosis not present

## 2016-11-15 DIAGNOSIS — H401133 Primary open-angle glaucoma, bilateral, severe stage: Secondary | ICD-10-CM | POA: Diagnosis not present

## 2016-11-25 DIAGNOSIS — I739 Peripheral vascular disease, unspecified: Secondary | ICD-10-CM | POA: Diagnosis not present

## 2016-11-25 DIAGNOSIS — M48062 Spinal stenosis, lumbar region with neurogenic claudication: Secondary | ICD-10-CM | POA: Diagnosis not present

## 2016-12-05 DIAGNOSIS — M48062 Spinal stenosis, lumbar region with neurogenic claudication: Secondary | ICD-10-CM | POA: Diagnosis not present

## 2016-12-05 DIAGNOSIS — M48061 Spinal stenosis, lumbar region without neurogenic claudication: Secondary | ICD-10-CM | POA: Diagnosis not present

## 2016-12-10 DIAGNOSIS — M48061 Spinal stenosis, lumbar region without neurogenic claudication: Secondary | ICD-10-CM | POA: Diagnosis not present

## 2016-12-10 DIAGNOSIS — M5440 Lumbago with sciatica, unspecified side: Secondary | ICD-10-CM | POA: Diagnosis not present

## 2016-12-12 ENCOUNTER — Other Ambulatory Visit: Payer: Self-pay | Admitting: Neurosurgery

## 2016-12-12 DIAGNOSIS — M48061 Spinal stenosis, lumbar region without neurogenic claudication: Secondary | ICD-10-CM

## 2016-12-23 ENCOUNTER — Ambulatory Visit
Admission: RE | Admit: 2016-12-23 | Discharge: 2016-12-23 | Disposition: A | Discharge status: Home / Self Care | Payer: Medicare Other | Source: Ambulatory Visit | Attending: Neurosurgery | Admitting: Neurosurgery

## 2016-12-23 DIAGNOSIS — M545 Low back pain: Secondary | ICD-10-CM | POA: Diagnosis not present

## 2016-12-23 DIAGNOSIS — M48061 Spinal stenosis, lumbar region without neurogenic claudication: Secondary | ICD-10-CM

## 2016-12-23 MED ORDER — METHYLPREDNISOLONE ACETATE 40 MG/ML INJ SUSP (RADIOLOG
120.0000 mg | Freq: Once | INTRAMUSCULAR | Status: AC
  Administered 2016-12-23: 120 mg via EPIDURAL

## 2016-12-23 MED ORDER — IOPAMIDOL (ISOVUE-M 200) INJECTION 41%
1.0000 mL | Freq: Once | INTRAMUSCULAR | Status: AC
  Administered 2016-12-23: 1 mL via EPIDURAL

## 2016-12-23 NOTE — Discharge Instructions (Signed)

## 2016-12-25 DIAGNOSIS — E785 Hyperlipidemia, unspecified: Secondary | ICD-10-CM | POA: Diagnosis not present

## 2016-12-25 DIAGNOSIS — I639 Cerebral infarction, unspecified: Secondary | ICD-10-CM | POA: Diagnosis not present

## 2016-12-25 DIAGNOSIS — Z9181 History of falling: Secondary | ICD-10-CM | POA: Diagnosis not present

## 2016-12-25 DIAGNOSIS — Z1389 Encounter for screening for other disorder: Secondary | ICD-10-CM | POA: Diagnosis not present

## 2016-12-25 DIAGNOSIS — I10 Essential (primary) hypertension: Secondary | ICD-10-CM | POA: Diagnosis not present

## 2016-12-25 DIAGNOSIS — I471 Supraventricular tachycardia: Secondary | ICD-10-CM | POA: Diagnosis not present

## 2016-12-25 DIAGNOSIS — D638 Anemia in other chronic diseases classified elsewhere: Secondary | ICD-10-CM | POA: Diagnosis not present

## 2017-01-07 DIAGNOSIS — C61 Malignant neoplasm of prostate: Secondary | ICD-10-CM | POA: Diagnosis not present

## 2017-01-07 DIAGNOSIS — N3020 Other chronic cystitis without hematuria: Secondary | ICD-10-CM | POA: Diagnosis not present

## 2017-01-13 DIAGNOSIS — M5440 Lumbago with sciatica, unspecified side: Secondary | ICD-10-CM | POA: Diagnosis not present

## 2017-01-25 DIAGNOSIS — Z23 Encounter for immunization: Secondary | ICD-10-CM | POA: Diagnosis not present

## 2017-02-03 DIAGNOSIS — Z0000 Encounter for general adult medical examination without abnormal findings: Secondary | ICD-10-CM | POA: Diagnosis not present

## 2017-02-03 DIAGNOSIS — Z125 Encounter for screening for malignant neoplasm of prostate: Secondary | ICD-10-CM | POA: Diagnosis not present

## 2017-02-03 DIAGNOSIS — E785 Hyperlipidemia, unspecified: Secondary | ICD-10-CM | POA: Diagnosis not present

## 2017-02-03 DIAGNOSIS — Z1331 Encounter for screening for depression: Secondary | ICD-10-CM | POA: Diagnosis not present

## 2017-02-03 DIAGNOSIS — Z9181 History of falling: Secondary | ICD-10-CM | POA: Diagnosis not present

## 2017-02-03 DIAGNOSIS — Z136 Encounter for screening for cardiovascular disorders: Secondary | ICD-10-CM | POA: Diagnosis not present

## 2017-02-03 DIAGNOSIS — Z139 Encounter for screening, unspecified: Secondary | ICD-10-CM | POA: Diagnosis not present

## 2017-02-25 DIAGNOSIS — G4733 Obstructive sleep apnea (adult) (pediatric): Secondary | ICD-10-CM | POA: Diagnosis not present

## 2017-03-27 DIAGNOSIS — Z139 Encounter for screening, unspecified: Secondary | ICD-10-CM | POA: Diagnosis not present

## 2017-03-27 DIAGNOSIS — I639 Cerebral infarction, unspecified: Secondary | ICD-10-CM | POA: Diagnosis not present

## 2017-03-27 DIAGNOSIS — D638 Anemia in other chronic diseases classified elsewhere: Secondary | ICD-10-CM | POA: Diagnosis not present

## 2017-03-27 DIAGNOSIS — E785 Hyperlipidemia, unspecified: Secondary | ICD-10-CM | POA: Diagnosis not present

## 2017-03-27 DIAGNOSIS — I471 Supraventricular tachycardia: Secondary | ICD-10-CM | POA: Diagnosis not present

## 2017-03-27 DIAGNOSIS — I10 Essential (primary) hypertension: Secondary | ICD-10-CM | POA: Diagnosis not present

## 2017-05-12 DIAGNOSIS — C61 Malignant neoplasm of prostate: Secondary | ICD-10-CM | POA: Diagnosis not present

## 2017-05-12 DIAGNOSIS — N3020 Other chronic cystitis without hematuria: Secondary | ICD-10-CM | POA: Diagnosis not present

## 2017-05-22 DIAGNOSIS — D3131 Benign neoplasm of right choroid: Secondary | ICD-10-CM | POA: Diagnosis not present

## 2017-05-22 DIAGNOSIS — H401133 Primary open-angle glaucoma, bilateral, severe stage: Secondary | ICD-10-CM | POA: Diagnosis not present

## 2017-05-22 DIAGNOSIS — Z961 Presence of intraocular lens: Secondary | ICD-10-CM | POA: Diagnosis not present

## 2017-05-22 DIAGNOSIS — H04411 Chronic dacryocystitis of right lacrimal passage: Secondary | ICD-10-CM | POA: Diagnosis not present

## 2017-05-29 DIAGNOSIS — G4733 Obstructive sleep apnea (adult) (pediatric): Secondary | ICD-10-CM | POA: Diagnosis not present

## 2017-06-26 DIAGNOSIS — L03211 Cellulitis of face: Secondary | ICD-10-CM | POA: Diagnosis not present

## 2017-06-26 DIAGNOSIS — H00012 Hordeolum externum right lower eyelid: Secondary | ICD-10-CM | POA: Diagnosis not present

## 2017-07-01 DIAGNOSIS — E785 Hyperlipidemia, unspecified: Secondary | ICD-10-CM | POA: Diagnosis not present

## 2017-07-01 DIAGNOSIS — I10 Essential (primary) hypertension: Secondary | ICD-10-CM | POA: Diagnosis not present

## 2017-07-01 DIAGNOSIS — C61 Malignant neoplasm of prostate: Secondary | ICD-10-CM | POA: Diagnosis not present

## 2017-07-01 DIAGNOSIS — I639 Cerebral infarction, unspecified: Secondary | ICD-10-CM | POA: Diagnosis not present

## 2017-07-01 DIAGNOSIS — I481 Persistent atrial fibrillation: Secondary | ICD-10-CM | POA: Diagnosis not present

## 2017-07-01 DIAGNOSIS — R600 Localized edema: Secondary | ICD-10-CM | POA: Diagnosis not present

## 2017-07-01 DIAGNOSIS — D638 Anemia in other chronic diseases classified elsewhere: Secondary | ICD-10-CM | POA: Diagnosis not present

## 2017-07-22 DIAGNOSIS — L03211 Cellulitis of face: Secondary | ICD-10-CM | POA: Diagnosis not present

## 2017-07-22 DIAGNOSIS — H00012 Hordeolum externum right lower eyelid: Secondary | ICD-10-CM | POA: Diagnosis not present

## 2017-09-12 DIAGNOSIS — C61 Malignant neoplasm of prostate: Secondary | ICD-10-CM | POA: Diagnosis not present

## 2017-09-12 DIAGNOSIS — N3020 Other chronic cystitis without hematuria: Secondary | ICD-10-CM | POA: Diagnosis not present

## 2017-10-09 DIAGNOSIS — I10 Essential (primary) hypertension: Secondary | ICD-10-CM | POA: Diagnosis not present

## 2017-10-09 DIAGNOSIS — E785 Hyperlipidemia, unspecified: Secondary | ICD-10-CM | POA: Diagnosis not present

## 2017-10-09 DIAGNOSIS — I639 Cerebral infarction, unspecified: Secondary | ICD-10-CM | POA: Diagnosis not present

## 2017-10-09 DIAGNOSIS — D638 Anemia in other chronic diseases classified elsewhere: Secondary | ICD-10-CM | POA: Diagnosis not present

## 2017-10-09 DIAGNOSIS — Z1339 Encounter for screening examination for other mental health and behavioral disorders: Secondary | ICD-10-CM | POA: Diagnosis not present

## 2017-10-09 DIAGNOSIS — I471 Supraventricular tachycardia: Secondary | ICD-10-CM | POA: Diagnosis not present

## 2017-10-17 DIAGNOSIS — D72819 Decreased white blood cell count, unspecified: Secondary | ICD-10-CM | POA: Diagnosis not present

## 2017-10-17 DIAGNOSIS — E291 Testicular hypofunction: Secondary | ICD-10-CM

## 2017-10-17 DIAGNOSIS — Z923 Personal history of irradiation: Secondary | ICD-10-CM

## 2017-10-17 DIAGNOSIS — Z79899 Other long term (current) drug therapy: Secondary | ICD-10-CM | POA: Diagnosis not present

## 2017-10-17 DIAGNOSIS — D649 Anemia, unspecified: Secondary | ICD-10-CM | POA: Insufficient documentation

## 2017-10-17 DIAGNOSIS — C61 Malignant neoplasm of prostate: Secondary | ICD-10-CM | POA: Diagnosis not present

## 2017-10-17 HISTORY — DX: Anemia, unspecified: D64.9

## 2017-10-17 HISTORY — DX: Decreased white blood cell count, unspecified: D72.819

## 2017-11-13 DIAGNOSIS — Z961 Presence of intraocular lens: Secondary | ICD-10-CM | POA: Diagnosis not present

## 2017-11-13 DIAGNOSIS — H34832 Tributary (branch) retinal vein occlusion, left eye, with macular edema: Secondary | ICD-10-CM | POA: Diagnosis not present

## 2017-11-13 DIAGNOSIS — H3581 Retinal edema: Secondary | ICD-10-CM | POA: Diagnosis not present

## 2017-11-13 DIAGNOSIS — D3131 Benign neoplasm of right choroid: Secondary | ICD-10-CM

## 2017-11-13 HISTORY — DX: Benign neoplasm of right choroid: D31.31

## 2017-11-18 DIAGNOSIS — D72819 Decreased white blood cell count, unspecified: Secondary | ICD-10-CM | POA: Diagnosis not present

## 2017-11-18 DIAGNOSIS — D649 Anemia, unspecified: Secondary | ICD-10-CM | POA: Diagnosis not present

## 2017-11-20 DIAGNOSIS — Z961 Presence of intraocular lens: Secondary | ICD-10-CM | POA: Diagnosis not present

## 2017-11-20 DIAGNOSIS — D3131 Benign neoplasm of right choroid: Secondary | ICD-10-CM | POA: Diagnosis not present

## 2017-11-20 DIAGNOSIS — H401133 Primary open-angle glaucoma, bilateral, severe stage: Secondary | ICD-10-CM | POA: Diagnosis not present

## 2017-12-17 ENCOUNTER — Other Ambulatory Visit: Payer: Self-pay

## 2017-12-17 NOTE — Patient Outreach (Signed)
[facility]) Care Management  12/17/2017  Jimmy Grant. 30-Dec-1924 [id number]   Medication Adherence call to Mr. [name] left a message for patient to call back patient is due on Diovan 160 mg and Simvastatin 80 mg. [name] is showing past due under [place].   [facility] Management Direct Dial [phone]  Fax [phone] [email or web address]

## 2018-01-13 DIAGNOSIS — N3020 Other chronic cystitis without hematuria: Secondary | ICD-10-CM | POA: Diagnosis not present

## 2018-01-21 ENCOUNTER — Other Ambulatory Visit: Payer: Self-pay

## 2018-01-21 NOTE — Patient Outreach (Signed)
[facility]) Care Management  01/21/2018  Eugenia Pancoast. 11-Nov-1924 [id number]    Medication Adherence call to Mr. Jimmy Grant spoke with patient he is cutting the pill and taking it thru out the day but does not take the hole pill on a regular basis patient said he prefer to do it this way  because it works for him, patient is showing past  due on Simvastatin 80 mg under [place].   [facility] Management Direct Dial [phone]  Fax [phone] [email or web address]

## 2018-01-29 DIAGNOSIS — Z23 Encounter for immunization: Secondary | ICD-10-CM | POA: Diagnosis not present

## 2018-01-29 DIAGNOSIS — E785 Hyperlipidemia, unspecified: Secondary | ICD-10-CM | POA: Diagnosis not present

## 2018-01-29 DIAGNOSIS — I10 Essential (primary) hypertension: Secondary | ICD-10-CM | POA: Diagnosis not present

## 2018-01-29 DIAGNOSIS — I471 Supraventricular tachycardia: Secondary | ICD-10-CM | POA: Diagnosis not present

## 2018-01-29 DIAGNOSIS — I639 Cerebral infarction, unspecified: Secondary | ICD-10-CM | POA: Diagnosis not present

## 2018-01-29 DIAGNOSIS — D638 Anemia in other chronic diseases classified elsewhere: Secondary | ICD-10-CM | POA: Diagnosis not present

## 2018-02-12 DIAGNOSIS — Z9181 History of falling: Secondary | ICD-10-CM | POA: Diagnosis not present

## 2018-02-12 DIAGNOSIS — J029 Acute pharyngitis, unspecified: Secondary | ICD-10-CM | POA: Diagnosis not present

## 2018-02-12 DIAGNOSIS — J019 Acute sinusitis, unspecified: Secondary | ICD-10-CM | POA: Diagnosis not present

## 2018-02-12 DIAGNOSIS — Z0000 Encounter for general adult medical examination without abnormal findings: Secondary | ICD-10-CM | POA: Diagnosis not present

## 2018-02-12 DIAGNOSIS — E785 Hyperlipidemia, unspecified: Secondary | ICD-10-CM | POA: Diagnosis not present

## 2018-02-12 DIAGNOSIS — J208 Acute bronchitis due to other specified organisms: Secondary | ICD-10-CM | POA: Diagnosis not present

## 2018-02-12 DIAGNOSIS — Z139 Encounter for screening, unspecified: Secondary | ICD-10-CM | POA: Diagnosis not present

## 2018-03-06 DIAGNOSIS — G4733 Obstructive sleep apnea (adult) (pediatric): Secondary | ICD-10-CM | POA: Diagnosis not present

## 2018-03-17 DIAGNOSIS — D72819 Decreased white blood cell count, unspecified: Secondary | ICD-10-CM | POA: Diagnosis not present

## 2018-03-17 DIAGNOSIS — D649 Anemia, unspecified: Secondary | ICD-10-CM | POA: Diagnosis not present

## 2018-03-17 DIAGNOSIS — Z862 Personal history of diseases of the blood and blood-forming organs and certain disorders involving the immune mechanism: Secondary | ICD-10-CM | POA: Diagnosis not present

## 2018-05-04 DIAGNOSIS — I471 Supraventricular tachycardia: Secondary | ICD-10-CM | POA: Diagnosis not present

## 2018-05-04 DIAGNOSIS — R600 Localized edema: Secondary | ICD-10-CM | POA: Diagnosis not present

## 2018-05-04 DIAGNOSIS — I10 Essential (primary) hypertension: Secondary | ICD-10-CM | POA: Diagnosis not present

## 2018-05-04 DIAGNOSIS — D638 Anemia in other chronic diseases classified elsewhere: Secondary | ICD-10-CM | POA: Diagnosis not present

## 2018-05-04 DIAGNOSIS — I639 Cerebral infarction, unspecified: Secondary | ICD-10-CM | POA: Diagnosis not present

## 2018-05-04 DIAGNOSIS — E785 Hyperlipidemia, unspecified: Secondary | ICD-10-CM | POA: Diagnosis not present

## 2018-05-28 DIAGNOSIS — H401133 Primary open-angle glaucoma, bilateral, severe stage: Secondary | ICD-10-CM | POA: Diagnosis not present

## 2018-05-28 DIAGNOSIS — H04411 Chronic dacryocystitis of right lacrimal passage: Secondary | ICD-10-CM | POA: Diagnosis not present

## 2018-06-02 DIAGNOSIS — G4733 Obstructive sleep apnea (adult) (pediatric): Secondary | ICD-10-CM | POA: Diagnosis not present

## 2018-06-08 DIAGNOSIS — H0234 Blepharochalasis left upper eyelid: Secondary | ICD-10-CM | POA: Diagnosis not present

## 2018-06-08 DIAGNOSIS — E7800 Pure hypercholesterolemia, unspecified: Secondary | ICD-10-CM

## 2018-06-08 DIAGNOSIS — H0489 Other disorders of lacrimal system: Secondary | ICD-10-CM | POA: Diagnosis not present

## 2018-06-08 DIAGNOSIS — H04411 Chronic dacryocystitis of right lacrimal passage: Secondary | ICD-10-CM | POA: Diagnosis not present

## 2018-06-08 DIAGNOSIS — H04551 Acquired stenosis of right nasolacrimal duct: Secondary | ICD-10-CM | POA: Diagnosis not present

## 2018-06-08 DIAGNOSIS — H0231 Blepharochalasis right upper eyelid: Secondary | ICD-10-CM | POA: Diagnosis not present

## 2018-06-08 DIAGNOSIS — H0232 Blepharochalasis right lower eyelid: Secondary | ICD-10-CM | POA: Diagnosis not present

## 2018-06-08 DIAGNOSIS — H0235 Blepharochalasis left lower eyelid: Secondary | ICD-10-CM | POA: Diagnosis not present

## 2018-06-08 HISTORY — DX: Pure hypercholesterolemia, unspecified: E78.00

## 2018-07-01 HISTORY — PX: TEAR DUCT PROBING: SHX793

## 2018-07-20 DIAGNOSIS — N3020 Other chronic cystitis without hematuria: Secondary | ICD-10-CM | POA: Diagnosis not present

## 2018-08-07 DIAGNOSIS — E785 Hyperlipidemia, unspecified: Secondary | ICD-10-CM | POA: Diagnosis not present

## 2018-08-07 DIAGNOSIS — I471 Supraventricular tachycardia: Secondary | ICD-10-CM | POA: Diagnosis not present

## 2018-08-07 DIAGNOSIS — D638 Anemia in other chronic diseases classified elsewhere: Secondary | ICD-10-CM | POA: Diagnosis not present

## 2018-08-07 DIAGNOSIS — I6990 Unspecified sequelae of unspecified cerebrovascular disease: Secondary | ICD-10-CM | POA: Diagnosis not present

## 2018-08-07 DIAGNOSIS — I10 Essential (primary) hypertension: Secondary | ICD-10-CM | POA: Diagnosis not present

## 2018-08-11 DIAGNOSIS — H0231 Blepharochalasis right upper eyelid: Secondary | ICD-10-CM | POA: Diagnosis not present

## 2018-08-11 DIAGNOSIS — H04551 Acquired stenosis of right nasolacrimal duct: Secondary | ICD-10-CM | POA: Diagnosis not present

## 2018-08-11 DIAGNOSIS — H04411 Chronic dacryocystitis of right lacrimal passage: Secondary | ICD-10-CM | POA: Diagnosis not present

## 2018-08-11 DIAGNOSIS — H04221 Epiphora due to insufficient drainage, right lacrimal gland: Secondary | ICD-10-CM | POA: Diagnosis not present

## 2018-08-11 DIAGNOSIS — H04561 Stenosis of right lacrimal punctum: Secondary | ICD-10-CM | POA: Diagnosis not present

## 2018-08-25 DIAGNOSIS — H04551 Acquired stenosis of right nasolacrimal duct: Secondary | ICD-10-CM | POA: Diagnosis not present

## 2018-08-25 DIAGNOSIS — H04411 Chronic dacryocystitis of right lacrimal passage: Secondary | ICD-10-CM | POA: Diagnosis not present

## 2018-09-01 DIAGNOSIS — H04551 Acquired stenosis of right nasolacrimal duct: Secondary | ICD-10-CM | POA: Diagnosis not present

## 2018-09-01 DIAGNOSIS — H04411 Chronic dacryocystitis of right lacrimal passage: Secondary | ICD-10-CM | POA: Diagnosis not present

## 2018-09-01 DIAGNOSIS — H0489 Other disorders of lacrimal system: Secondary | ICD-10-CM | POA: Diagnosis not present

## 2018-09-16 DIAGNOSIS — D649 Anemia, unspecified: Secondary | ICD-10-CM | POA: Diagnosis not present

## 2018-09-16 DIAGNOSIS — D72819 Decreased white blood cell count, unspecified: Secondary | ICD-10-CM | POA: Diagnosis not present

## 2018-10-06 DIAGNOSIS — H04551 Acquired stenosis of right nasolacrimal duct: Secondary | ICD-10-CM | POA: Diagnosis not present

## 2018-10-19 DIAGNOSIS — H04551 Acquired stenosis of right nasolacrimal duct: Secondary | ICD-10-CM | POA: Diagnosis not present

## 2018-10-19 DIAGNOSIS — H04411 Chronic dacryocystitis of right lacrimal passage: Secondary | ICD-10-CM | POA: Diagnosis not present

## 2018-10-19 DIAGNOSIS — H0489 Other disorders of lacrimal system: Secondary | ICD-10-CM | POA: Diagnosis not present

## 2018-11-18 DIAGNOSIS — I471 Supraventricular tachycardia: Secondary | ICD-10-CM | POA: Diagnosis not present

## 2018-11-18 DIAGNOSIS — E785 Hyperlipidemia, unspecified: Secondary | ICD-10-CM | POA: Diagnosis not present

## 2018-11-18 DIAGNOSIS — D638 Anemia in other chronic diseases classified elsewhere: Secondary | ICD-10-CM | POA: Diagnosis not present

## 2018-11-18 DIAGNOSIS — I6990 Unspecified sequelae of unspecified cerebrovascular disease: Secondary | ICD-10-CM | POA: Diagnosis not present

## 2018-11-18 DIAGNOSIS — I10 Essential (primary) hypertension: Secondary | ICD-10-CM | POA: Diagnosis not present

## 2018-11-18 DIAGNOSIS — R531 Weakness: Secondary | ICD-10-CM | POA: Diagnosis not present

## 2018-11-18 DIAGNOSIS — R600 Localized edema: Secondary | ICD-10-CM | POA: Diagnosis not present

## 2018-11-25 DIAGNOSIS — D3131 Benign neoplasm of right choroid: Secondary | ICD-10-CM | POA: Diagnosis not present

## 2018-11-25 DIAGNOSIS — H401133 Primary open-angle glaucoma, bilateral, severe stage: Secondary | ICD-10-CM | POA: Diagnosis not present

## 2018-11-25 DIAGNOSIS — Z961 Presence of intraocular lens: Secondary | ICD-10-CM | POA: Diagnosis not present

## 2018-11-30 DIAGNOSIS — G4733 Obstructive sleep apnea (adult) (pediatric): Secondary | ICD-10-CM | POA: Diagnosis not present

## 2018-12-16 DIAGNOSIS — Z23 Encounter for immunization: Secondary | ICD-10-CM | POA: Diagnosis not present

## 2018-12-31 DIAGNOSIS — D3131 Benign neoplasm of right choroid: Secondary | ICD-10-CM | POA: Diagnosis not present

## 2018-12-31 DIAGNOSIS — Z961 Presence of intraocular lens: Secondary | ICD-10-CM | POA: Diagnosis not present

## 2018-12-31 DIAGNOSIS — H401132 Primary open-angle glaucoma, bilateral, moderate stage: Secondary | ICD-10-CM | POA: Diagnosis not present

## 2018-12-31 DIAGNOSIS — H34832 Tributary (branch) retinal vein occlusion, left eye, with macular edema: Secondary | ICD-10-CM | POA: Diagnosis not present

## 2019-01-18 DIAGNOSIS — N3020 Other chronic cystitis without hematuria: Secondary | ICD-10-CM | POA: Diagnosis not present

## 2019-02-16 DIAGNOSIS — E785 Hyperlipidemia, unspecified: Secondary | ICD-10-CM | POA: Diagnosis not present

## 2019-02-16 DIAGNOSIS — Z0000 Encounter for general adult medical examination without abnormal findings: Secondary | ICD-10-CM | POA: Diagnosis not present

## 2019-02-16 DIAGNOSIS — Z139 Encounter for screening, unspecified: Secondary | ICD-10-CM | POA: Diagnosis not present

## 2019-02-16 DIAGNOSIS — Z9181 History of falling: Secondary | ICD-10-CM | POA: Diagnosis not present

## 2019-02-26 DIAGNOSIS — G4733 Obstructive sleep apnea (adult) (pediatric): Secondary | ICD-10-CM | POA: Diagnosis not present

## 2019-03-01 DIAGNOSIS — I6990 Unspecified sequelae of unspecified cerebrovascular disease: Secondary | ICD-10-CM | POA: Diagnosis not present

## 2019-03-01 DIAGNOSIS — D638 Anemia in other chronic diseases classified elsewhere: Secondary | ICD-10-CM | POA: Diagnosis not present

## 2019-03-01 DIAGNOSIS — I471 Supraventricular tachycardia: Secondary | ICD-10-CM | POA: Diagnosis not present

## 2019-03-01 DIAGNOSIS — E538 Deficiency of other specified B group vitamins: Secondary | ICD-10-CM | POA: Diagnosis not present

## 2019-03-01 DIAGNOSIS — R600 Localized edema: Secondary | ICD-10-CM | POA: Diagnosis not present

## 2019-03-01 DIAGNOSIS — I10 Essential (primary) hypertension: Secondary | ICD-10-CM | POA: Diagnosis not present

## 2019-03-01 DIAGNOSIS — E785 Hyperlipidemia, unspecified: Secondary | ICD-10-CM | POA: Diagnosis not present

## 2019-03-18 DIAGNOSIS — D649 Anemia, unspecified: Secondary | ICD-10-CM | POA: Diagnosis not present

## 2019-03-18 DIAGNOSIS — D72819 Decreased white blood cell count, unspecified: Secondary | ICD-10-CM | POA: Diagnosis not present

## 2019-03-29 DIAGNOSIS — B9689 Other specified bacterial agents as the cause of diseases classified elsewhere: Secondary | ICD-10-CM | POA: Diagnosis not present

## 2019-03-29 DIAGNOSIS — J208 Acute bronchitis due to other specified organisms: Secondary | ICD-10-CM | POA: Diagnosis not present

## 2019-04-03 DIAGNOSIS — Z743 Need for continuous supervision: Secondary | ICD-10-CM | POA: Diagnosis not present

## 2019-04-03 DIAGNOSIS — U071 COVID-19: Secondary | ICD-10-CM | POA: Diagnosis not present

## 2019-04-03 DIAGNOSIS — R9431 Abnormal electrocardiogram [ECG] [EKG]: Secondary | ICD-10-CM | POA: Diagnosis not present

## 2019-04-03 DIAGNOSIS — I959 Hypotension, unspecified: Secondary | ICD-10-CM | POA: Diagnosis not present

## 2019-04-03 DIAGNOSIS — N289 Disorder of kidney and ureter, unspecified: Secondary | ICD-10-CM | POA: Diagnosis not present

## 2019-04-03 DIAGNOSIS — R55 Syncope and collapse: Secondary | ICD-10-CM | POA: Diagnosis not present

## 2019-04-03 DIAGNOSIS — R0902 Hypoxemia: Secondary | ICD-10-CM | POA: Diagnosis not present

## 2019-04-03 DIAGNOSIS — R531 Weakness: Secondary | ICD-10-CM | POA: Diagnosis not present

## 2019-04-03 DIAGNOSIS — R0602 Shortness of breath: Secondary | ICD-10-CM | POA: Diagnosis not present

## 2019-04-06 DIAGNOSIS — I10 Essential (primary) hypertension: Secondary | ICD-10-CM | POA: Diagnosis not present

## 2019-04-06 DIAGNOSIS — Z03818 Encounter for observation for suspected exposure to other biological agents ruled out: Secondary | ICD-10-CM | POA: Diagnosis not present

## 2019-04-06 DIAGNOSIS — S162XXA Laceration of muscle, fascia and tendon at neck level, initial encounter: Secondary | ICD-10-CM | POA: Diagnosis not present

## 2019-04-07 ENCOUNTER — Inpatient Hospital Stay (HOSPITAL_COMMUNITY)
Admission: EM | Admit: 2019-04-07 | Discharge: 2019-04-11 | DRG: 177 | Disposition: A | Discharge status: Home Health | Payer: Medicare Other | Attending: Family Medicine | Admitting: Family Medicine

## 2019-04-07 ENCOUNTER — Emergency Department (HOSPITAL_COMMUNITY): Payer: Medicare Other

## 2019-04-07 ENCOUNTER — Encounter (HOSPITAL_COMMUNITY): Payer: Self-pay | Admitting: Internal Medicine

## 2019-04-07 ENCOUNTER — Other Ambulatory Visit: Payer: Self-pay

## 2019-04-07 DIAGNOSIS — H348392 Tributary (branch) retinal vein occlusion, unspecified eye, stable: Secondary | ICD-10-CM | POA: Diagnosis not present

## 2019-04-07 DIAGNOSIS — H4010X0 Unspecified open-angle glaucoma, stage unspecified: Secondary | ICD-10-CM | POA: Diagnosis not present

## 2019-04-07 DIAGNOSIS — G4733 Obstructive sleep apnea (adult) (pediatric): Secondary | ICD-10-CM | POA: Diagnosis not present

## 2019-04-07 DIAGNOSIS — Z66 Do not resuscitate: Secondary | ICD-10-CM | POA: Diagnosis not present

## 2019-04-07 DIAGNOSIS — R0902 Hypoxemia: Secondary | ICD-10-CM | POA: Diagnosis not present

## 2019-04-07 DIAGNOSIS — J9601 Acute respiratory failure with hypoxia: Secondary | ICD-10-CM | POA: Diagnosis present

## 2019-04-07 DIAGNOSIS — R069 Unspecified abnormalities of breathing: Secondary | ICD-10-CM | POA: Diagnosis not present

## 2019-04-07 DIAGNOSIS — E785 Hyperlipidemia, unspecified: Secondary | ICD-10-CM | POA: Diagnosis not present

## 2019-04-07 DIAGNOSIS — Z87891 Personal history of nicotine dependence: Secondary | ICD-10-CM | POA: Diagnosis not present

## 2019-04-07 DIAGNOSIS — Z03818 Encounter for observation for suspected exposure to other biological agents ruled out: Secondary | ICD-10-CM | POA: Diagnosis not present

## 2019-04-07 DIAGNOSIS — H34832 Tributary (branch) retinal vein occlusion, left eye, with macular edema: Secondary | ICD-10-CM | POA: Diagnosis not present

## 2019-04-07 DIAGNOSIS — Z9989 Dependence on other enabling machines and devices: Secondary | ICD-10-CM | POA: Diagnosis not present

## 2019-04-07 DIAGNOSIS — S162XXA Laceration of muscle, fascia and tendon at neck level, initial encounter: Secondary | ICD-10-CM | POA: Diagnosis not present

## 2019-04-07 DIAGNOSIS — J069 Acute upper respiratory infection, unspecified: Secondary | ICD-10-CM

## 2019-04-07 DIAGNOSIS — Z7982 Long term (current) use of aspirin: Secondary | ICD-10-CM

## 2019-04-07 DIAGNOSIS — R0602 Shortness of breath: Secondary | ICD-10-CM | POA: Diagnosis not present

## 2019-04-07 DIAGNOSIS — I447 Left bundle-branch block, unspecified: Secondary | ICD-10-CM | POA: Diagnosis not present

## 2019-04-07 DIAGNOSIS — Z923 Personal history of irradiation: Secondary | ICD-10-CM | POA: Diagnosis not present

## 2019-04-07 DIAGNOSIS — I10 Essential (primary) hypertension: Secondary | ICD-10-CM | POA: Diagnosis not present

## 2019-04-07 DIAGNOSIS — Z8546 Personal history of malignant neoplasm of prostate: Secondary | ICD-10-CM

## 2019-04-07 DIAGNOSIS — J1282 Pneumonia due to coronavirus disease 2019: Secondary | ICD-10-CM | POA: Diagnosis present

## 2019-04-07 DIAGNOSIS — Z743 Need for continuous supervision: Secondary | ICD-10-CM | POA: Diagnosis not present

## 2019-04-07 DIAGNOSIS — H409 Unspecified glaucoma: Secondary | ICD-10-CM | POA: Diagnosis present

## 2019-04-07 DIAGNOSIS — U071 COVID-19: Secondary | ICD-10-CM | POA: Diagnosis present

## 2019-04-07 DIAGNOSIS — I959 Hypotension, unspecified: Secondary | ICD-10-CM | POA: Diagnosis not present

## 2019-04-07 DIAGNOSIS — H401133 Primary open-angle glaucoma, bilateral, severe stage: Secondary | ICD-10-CM | POA: Diagnosis not present

## 2019-04-07 DIAGNOSIS — Z888 Allergy status to other drugs, medicaments and biological substances status: Secondary | ICD-10-CM | POA: Diagnosis not present

## 2019-04-07 DIAGNOSIS — Z79899 Other long term (current) drug therapy: Secondary | ICD-10-CM

## 2019-04-07 DIAGNOSIS — R279 Unspecified lack of coordination: Secondary | ICD-10-CM | POA: Diagnosis not present

## 2019-04-07 HISTORY — DX: COVID-19: U07.1

## 2019-04-07 HISTORY — DX: Unspecified glaucoma: H40.9

## 2019-04-07 HISTORY — DX: Hyperlipidemia, unspecified: E78.5

## 2019-04-07 HISTORY — DX: Acute upper respiratory infection, unspecified: J06.9

## 2019-04-07 HISTORY — DX: Tributary (branch) retinal vein occlusion, unspecified eye, stable: H34.8392

## 2019-04-07 HISTORY — DX: Personal history of malignant neoplasm of prostate: Z85.46

## 2019-04-07 HISTORY — DX: Obstructive sleep apnea (adult) (pediatric): G47.33

## 2019-04-07 LAB — TRIGLYCERIDES: Triglycerides: 124 mg/dL (ref <150)

## 2019-04-07 LAB — COMPREHENSIVE METABOLIC PANEL
ALT: 20 U/L (ref 0–44)
AST: 25 U/L (ref 15–41)
Albumin: 3.4 g/dL — ABNORMAL LOW (ref 3.5–5.0)
Alkaline Phosphatase: 53 U/L (ref 38–126)
Anion gap: 9 (ref 5–15)
BUN: 27 mg/dL — ABNORMAL HIGH (ref 8–23)
CO2: 22 mmol/L (ref 22–32)
Calcium: 8.7 mg/dL — ABNORMAL LOW (ref 8.9–10.3)
Chloride: 96 mmol/L — ABNORMAL LOW (ref 98–111)
Creatinine, Ser: 1.3 mg/dL — ABNORMAL HIGH (ref 0.61–1.24)
GFR calc Af Amer: 55 mL/min — ABNORMAL LOW (ref >60)
GFR calc non Af Amer: 47 mL/min — ABNORMAL LOW (ref >60)
Glucose, Bld: 102 mg/dL — ABNORMAL HIGH (ref 70–99)
Potassium: 4 mmol/L (ref 3.5–5.1)
Sodium: 127 mmol/L — ABNORMAL LOW (ref 135–145)
Total Bilirubin: 0.7 mg/dL (ref 0.3–1.2)
Total Protein: 6.3 g/dL — ABNORMAL LOW (ref 6.5–8.1)

## 2019-04-07 LAB — CBC WITH DIFFERENTIAL/PLATELET
Abs Immature Granulocytes: 0.24 10*3/uL — ABNORMAL HIGH (ref 0.00–0.07)
Basophils Absolute: 0 10*3/uL (ref 0.0–0.1)
Basophils Relative: 0 %
Eosinophils Absolute: 0 10*3/uL (ref 0.0–0.5)
Eosinophils Relative: 0 %
HCT: 34.3 % — ABNORMAL LOW (ref 39.0–52.0)
Hemoglobin: 11.7 g/dL — ABNORMAL LOW (ref 13.0–17.0)
Immature Granulocytes: 4 %
Lymphocytes Relative: 5 %
Lymphs Abs: 0.3 10*3/uL — ABNORMAL LOW (ref 0.7–4.0)
MCH: 30.9 pg (ref 26.0–34.0)
MCHC: 34.1 g/dL (ref 30.0–36.0)
MCV: 90.5 fL (ref 80.0–100.0)
Monocytes Absolute: 1.1 10*3/uL — ABNORMAL HIGH (ref 0.1–1.0)
Monocytes Relative: 18 %
Neutro Abs: 4.5 10*3/uL (ref 1.7–7.7)
Neutrophils Relative %: 73 %
Platelets: 80 10*3/uL — ABNORMAL LOW (ref 150–400)
RBC: 3.79 MIL/uL — ABNORMAL LOW (ref 4.22–5.81)
RDW: 12.7 % (ref 11.5–15.5)
WBC: 6.1 10*3/uL (ref 4.0–10.5)
nRBC: 0 % (ref 0.0–0.2)

## 2019-04-07 LAB — PROCALCITONIN: Procalcitonin: 0.62 ng/mL

## 2019-04-07 LAB — POC SARS CORONAVIRUS 2 AG -  ED: SARS Coronavirus 2 Ag: POSITIVE — AB

## 2019-04-07 LAB — FERRITIN: Ferritin: 455 ng/mL — ABNORMAL HIGH (ref 24–336)

## 2019-04-07 LAB — C-REACTIVE PROTEIN: CRP: 16.1 mg/dL — ABNORMAL HIGH (ref <1.0)

## 2019-04-07 LAB — FIBRINOGEN: Fibrinogen: 765 mg/dL — ABNORMAL HIGH (ref 210–475)

## 2019-04-07 LAB — LACTIC ACID, PLASMA: Lactic Acid, Venous: 1.9 mmol/L (ref 0.5–1.9)

## 2019-04-07 LAB — D-DIMER, QUANTITATIVE: D-Dimer, Quant: 0.98 ug/mL-FEU — ABNORMAL HIGH (ref 0.00–0.50)

## 2019-04-07 LAB — LACTATE DEHYDROGENASE: LDH: 204 U/L — ABNORMAL HIGH (ref 98–192)

## 2019-04-07 MED ORDER — GUAIFENESIN-DM 100-10 MG/5ML PO SYRP: 10.0000 mL | ORAL_SOLUTION | ORAL | Status: DC | PRN

## 2019-04-07 MED ORDER — PROSIGHT PO TABS
1.0000 | ORAL_TABLET | Freq: Every day | ORAL | Status: DC
  Administered 2019-04-08 – 2019-04-11 (×4): 1 via ORAL
  Filled 2019-04-07 (×6): qty 1

## 2019-04-07 MED ORDER — SODIUM CHLORIDE 0.9 % IV SOLN
2.0000 g | INTRAVENOUS | Status: DC
  Administered 2019-04-07 – 2019-04-09 (×3): 2 g via INTRAVENOUS
  Filled 2019-04-07 (×3): qty 20

## 2019-04-07 MED ORDER — ACETAMINOPHEN 325 MG PO TABS: 650.0000 mg | ORAL_TABLET | Freq: Four times a day (QID) | ORAL | Status: DC | PRN

## 2019-04-07 MED ORDER — IRBESARTAN 150 MG PO TABS
150.0000 mg | ORAL_TABLET | Freq: Every day | ORAL | Status: DC
  Filled 2019-04-07: qty 1

## 2019-04-07 MED ORDER — DORZOLAMIDE HCL-TIMOLOL MAL 2-0.5 % OP SOLN
1.0000 [drp] | Freq: Two times a day (BID) | OPHTHALMIC | Status: DC
  Administered 2019-04-08 – 2019-04-11 (×8): 1 [drp] via OPHTHALMIC
  Filled 2019-04-07 (×2): qty 10

## 2019-04-07 MED ORDER — AZELASTINE HCL 0.1 % NA SOLN
2.0000 | Freq: Every day | NASAL | Status: DC
  Administered 2019-04-08 – 2019-04-11 (×4): 2 via NASAL
  Filled 2019-04-07 (×2): qty 30

## 2019-04-07 MED ORDER — POLYETHYLENE GLYCOL 3350 17 G PO PACK: 17.0000 g | PACK | Freq: Every day | ORAL | Status: DC | PRN

## 2019-04-07 MED ORDER — SODIUM CHLORIDE 0.9% FLUSH
3.0000 mL | Freq: Two times a day (BID) | INTRAVENOUS | Status: DC
  Administered 2019-04-07 – 2019-04-11 (×8): 3 mL via INTRAVENOUS

## 2019-04-07 MED ORDER — ONDANSETRON HCL 4 MG/2ML IJ SOLN: 4.0000 mg | Freq: Four times a day (QID) | INTRAMUSCULAR | Status: DC | PRN

## 2019-04-07 MED ORDER — ALBUTEROL SULFATE HFA 108 (90 BASE) MCG/ACT IN AERS
2.0000 | INHALATION_SPRAY | RESPIRATORY_TRACT | Status: DC | PRN
  Administered 2019-04-11: 2 via RESPIRATORY_TRACT
  Filled 2019-04-07: qty 6.7

## 2019-04-07 MED ORDER — ACEBUTOLOL HCL 400 MG PO CAPS: 400.0000 mg | ORAL_CAPSULE | Freq: Every day | ORAL | Status: DC

## 2019-04-07 MED ORDER — PRESERVISION AREDS 2+MULTI VIT PO CAPS: 1.0000 | ORAL_CAPSULE | Freq: Two times a day (BID) | ORAL | Status: DC

## 2019-04-07 MED ORDER — VITAMIN D3 25 MCG (1000 UNIT) PO TABS
1000.0000 [IU] | ORAL_TABLET | Freq: Every day | ORAL | Status: DC
  Administered 2019-04-08 – 2019-04-11 (×4): 1000 [IU] via ORAL
  Filled 2019-04-07 (×8): qty 1

## 2019-04-07 MED ORDER — BISACODYL 5 MG PO TBEC: 5.0000 mg | DELAYED_RELEASE_TABLET | Freq: Every day | ORAL | Status: DC | PRN

## 2019-04-07 MED ORDER — SODIUM CHLORIDE 0.9 % IV SOLN: 250.0000 mL | INTRAVENOUS | Status: DC | PRN

## 2019-04-07 MED ORDER — ONDANSETRON HCL 4 MG PO TABS: 4.0000 mg | ORAL_TABLET | Freq: Four times a day (QID) | ORAL | Status: DC | PRN

## 2019-04-07 MED ORDER — SODIUM CHLORIDE 0.9 % IV BOLUS
500.0000 mL | Freq: Once | INTRAVENOUS | Status: AC
  Administered 2019-04-07: 500 mL via INTRAVENOUS

## 2019-04-07 MED ORDER — DEXAMETHASONE SODIUM PHOSPHATE 10 MG/ML IJ SOLN
6.0000 mg | Freq: Once | INTRAMUSCULAR | Status: AC
  Administered 2019-04-07: 6 mg via INTRAVENOUS
  Filled 2019-04-07: qty 1

## 2019-04-07 MED ORDER — SODIUM CHLORIDE 0.9 % IV SOLN
100.0000 mg | Freq: Every day | INTRAVENOUS | Status: AC
  Administered 2019-04-08 – 2019-04-11 (×4): 100 mg via INTRAVENOUS
  Filled 2019-04-07 (×4): qty 20

## 2019-04-07 MED ORDER — FLEET ENEMA 7-19 GM/118ML RE ENEM
1.0000 | ENEMA | Freq: Once | RECTAL | Status: DC | PRN
  Filled 2019-04-07: qty 1

## 2019-04-07 MED ORDER — HYDROCOD POLST-CPM POLST ER 10-8 MG/5ML PO SUER: 5.0000 mL | Freq: Two times a day (BID) | ORAL | Status: DC | PRN

## 2019-04-07 MED ORDER — ASPIRIN EC 81 MG PO TBEC
81.0000 mg | DELAYED_RELEASE_TABLET | Freq: Every day | ORAL | Status: DC
  Administered 2019-04-08 – 2019-04-11 (×4): 81 mg via ORAL
  Filled 2019-04-07 (×4): qty 1

## 2019-04-07 MED ORDER — SODIUM CHLORIDE 0.9 % IV SOLN
500.0000 mg | INTRAVENOUS | Status: DC
  Administered 2019-04-07 – 2019-04-09 (×3): 500 mg via INTRAVENOUS
  Filled 2019-04-07 (×4): qty 500

## 2019-04-07 MED ORDER — SODIUM CHLORIDE 0.9 % IV SOLN
200.0000 mg | Freq: Once | INTRAVENOUS | Status: AC
  Administered 2019-04-07: 200 mg via INTRAVENOUS
  Filled 2019-04-07: qty 40

## 2019-04-07 MED ORDER — SODIUM CHLORIDE 0.9% FLUSH: 3.0000 mL | INTRAVENOUS | Status: DC | PRN

## 2019-04-07 MED ORDER — OXYCODONE HCL 5 MG PO TABS: 5.0000 mg | ORAL_TABLET | ORAL | Status: DC | PRN

## 2019-04-07 MED ORDER — ACEBUTOLOL HCL 200 MG PO CAPS
400.0000 mg | ORAL_CAPSULE | Freq: Every day | ORAL | Status: DC
  Administered 2019-04-08 – 2019-04-11 (×5): 400 mg via ORAL
  Filled 2019-04-07 (×2): qty 2
  Filled 2019-04-07: qty 1
  Filled 2019-04-07 (×2): qty 2
  Filled 2019-04-07: qty 1

## 2019-04-07 MED ORDER — SIMVASTATIN 20 MG PO TABS
20.0000 mg | ORAL_TABLET | Freq: Every evening | ORAL | Status: DC
  Administered 2019-04-08 – 2019-04-10 (×4): 20 mg via ORAL
  Filled 2019-04-07 (×4): qty 1

## 2019-04-07 MED ORDER — ENOXAPARIN SODIUM 40 MG/0.4ML ~~LOC~~ SOLN
40.0000 mg | SUBCUTANEOUS | Status: DC
  Administered 2019-04-08 – 2019-04-10 (×4): 40 mg via SUBCUTANEOUS
  Filled 2019-04-07 (×4): qty 0.4

## 2019-04-07 MED ORDER — DEXAMETHASONE 6 MG PO TABS
6.0000 mg | ORAL_TABLET | ORAL | Status: DC
  Administered 2019-04-08: 6 mg via ORAL
  Filled 2019-04-07: qty 1

## 2019-04-07 NOTE — ED Notes (Signed)
Report given to RN at [facility]. All questions answered

## 2019-04-07 NOTE — ED Provider Notes (Signed)
[facility] EMERGENCY DEPARTMENT Provider Note   CSN: [id number] Arrival date & time: 04/07/19  1019     History Chief Complaint  Patient presents with  . COVID+    [facility] Jimmy Grant. is a 86 y.o. male.  Patient with history of sleep apnea uses CPAP, history of prostate cancer status post treatment with radiation and Lupron off treatment for 2 years -- presents from independent living at [facility] in [place] with hypoxia and positive coronavirus testing.  Patient states that he began feeling poorly on 12/26.  He had a cough and was treated for bronchitis.  He was given "a shot" and an antibiotic which he took without improvement.  He was tested for coronavirus on 04/03/2019 and tested positive.  He has been checking his pulse ox daily and first noted low readings yesterday.  This was associated with worsening shortness of breath especially with exertion.  Patient states that he had a "terrible" night last night and had difficulty sleeping with cough and shortness of breath.  He did not have an oxygen saturation above 87% at rest today.  EMS was called and patient was improved on oxygen.  He states that he felt very weak and is agreeable to admission to the hospital.  No reported fevers, URI symptoms.  No known sick contacts.  He lives at home with his wife who is reportedly doing well.  No nausea, vomiting, diarrhea.  No history of heart failure, COPD, asthma.        No past medical history on file.  There are no problems to display for this patient.   The histories are not reviewed yet. Please review them in the "History" navigator section and refresh this [place].     No family history on file.  Social History   Tobacco Use  . Smoking status: Not on file  Substance Use Topics  . Alcohol use: Not on file  . Drug use: Not on file    Home Medications Prior to Admission medications   Not on File    Allergies    Brimonidine  Review of Systems     Review of Systems  Constitutional: Negative for fever.  HENT: Negative for rhinorrhea and sore throat.   Eyes: Negative for redness.  Respiratory: Positive for cough and shortness of breath. Negative for wheezing.   Cardiovascular: Negative for chest pain.  Gastrointestinal: Negative for abdominal pain, diarrhea, nausea and vomiting.  Genitourinary: Negative for dysuria.  Musculoskeletal: Negative for myalgias.  Skin: Negative for rash.  Neurological: Positive for weakness. Negative for headaches.    Physical Exam Updated Vital Signs BP 119/72   Pulse 87   Temp 99.2 F (37.3 C) (Oral)   Resp 16   SpO2 97%   Physical Exam Vitals and nursing note reviewed.  Constitutional:      Appearance: He is well-developed.  HENT:     Head: Normocephalic and atraumatic.  Eyes:     General:        Right eye: No discharge.        Left eye: No discharge.     Conjunctiva/sclera: Conjunctivae normal.  Cardiovascular:     Rate and Rhythm: Normal rate and regular rhythm.     Heart sounds: Normal heart sounds.  Pulmonary:     Effort: Pulmonary effort is normal. No respiratory distress or retractions.     Breath sounds: No decreased air movement. Decreased breath sounds present.  Abdominal:     Palpations: Abdomen is  soft.     Tenderness: There is no abdominal tenderness. There is no guarding or rebound.  Musculoskeletal:     Cervical back: Normal range of motion and neck supple.     Right lower leg: No edema.     Left lower leg: No edema.  Skin:    General: Skin is warm and dry.  Neurological:     Mental Status: He is alert.     ED Results / Procedures / Treatments   Labs (all labs ordered are listed, but only abnormal results are displayed) Labs Reviewed  CBC WITH DIFFERENTIAL/PLATELET - Abnormal; Notable for the following components:      Result Value   RBC 3.79 (*)    Hemoglobin 11.7 (*)    HCT 34.3 (*)    Platelets 80 (*)    Lymphs Abs 0.3 (*)    Monocytes Absolute 1.1  (*)    Abs Immature Granulocytes 0.24 (*)    All other components within normal limits  COMPREHENSIVE METABOLIC PANEL - Abnormal; Notable for the following components:   Sodium 127 (*)    Chloride 96 (*)    Glucose, Bld 102 (*)    BUN 27 (*)    Creatinine, Ser 1.30 (*)    Calcium 8.7 (*)    Total Protein 6.3 (*)    Albumin 3.4 (*)    GFR calc non Af Amer 47 (*)    GFR calc Af Amer 55 (*)    All other components within normal limits  D-DIMER, QUANTITATIVE (NOT AT [facility]) - Abnormal; Notable for the following components:   D-Dimer, Quant 0.98 (*)    All other components within normal limits  LACTATE DEHYDROGENASE - Abnormal; Notable for the following components:   LDH 204 (*)    All other components within normal limits  FERRITIN - Abnormal; Notable for the following components:   Ferritin 455 (*)    All other components within normal limits  FIBRINOGEN - Abnormal; Notable for the following components:   Fibrinogen 765 (*)    All other components within normal limits  C-REACTIVE PROTEIN - Abnormal; Notable for the following components:   CRP 16.1 (*)    All other components within normal limits  POC SARS CORONAVIRUS 2 AG -  ED - Abnormal; Notable for the following components:   SARS Coronavirus 2 Ag POSITIVE (*)    All other components within normal limits  CULTURE, BLOOD (ROUTINE X 2)  CULTURE, BLOOD (ROUTINE X 2)  LACTIC ACID, PLASMA  TRIGLYCERIDES  LACTIC ACID, PLASMA  PROCALCITONIN    ED ECG REPORT   Date: 04/07/2019  Rate: 87  Rhythm: normal sinus rhythm  QRS Axis: left  Intervals: normal  ST/T Wave abnormalities: nonspecific ST/T changes  Conduction Disutrbances:left bundle branch block  Narrative Interpretation: LVH  Old EKG Reviewed: none available  I have personally reviewed the EKG tracing and agree with the computerized printout as noted.  Radiology DG Chest Port 1 View  Result Date: 04/07/2019 CLINICAL DATA:  Shortness of breath.  COVID positive EXAM:  PORTABLE CHEST 1 VIEW COMPARISON:  04/03/2019 FINDINGS: New patchy streaky opacities noted at the left lung base and in the right mid lung. Findings concerning for early infiltrate/pneumonia. Heart is normal size. No effusions or acute bony abnormality. IMPRESSION: Streaky opacities at the left lung base and the right mid lung concerning for early infiltrates/pneumonia. Electronically Signed   By: Rolm Baptise M.D.   On: 04/07/2019 11:15    Procedures Procedures (  including critical care time)  Medications Ordered in ED Medications  dexamethasone (DECADRON) injection 6 mg (6 mg Intravenous Given 04/07/19 1342)  sodium chloride 0.9 % bolus 500 mL (500 mLs Intravenous New Bag/Given 04/07/19 1342)    ED Course  I have reviewed the triage vital signs and the nursing notes.  Pertinent labs & imaging results that were available during my care of the patient were reviewed by me and considered in my medical decision making (see chart for details).  Patient seen and examined.  Work-up initiated.  Patient is not in any respiratory distress on oxygen upon arrival.  Report from EMS.  Will likely need admission given hypoxia in setting of coronavirus.  Vital signs reviewed and are as follows: BP 119/72   Pulse 87   Temp 99.2 F (37.3 C) (Oral)   Resp 16   SpO2 97%   11:37 AM Labs, CXR, history consistent with Covid-19 PNA. Pt denies CP.   1:53 PM delay in CMP.  Patient rechecked.  He looks comfortable on 4 L oxygen nasal cannula satting at 94%.  Fluid bolus ordered.  Discussed case with Dr. Lorin Mercy of [facility] who will see.  Erick Alley Omi Tavella. was evaluated in Emergency Department on 04/07/2019 for the symptoms described in the history of present illness. He was evaluated in the context of the global COVID-19 pandemic, which necessitated consideration that the patient might be at risk for infection with the SARS-CoV-2 virus that causes COVID-19. Institutional protocols and algorithms  that pertain to the evaluation of patients at risk for COVID-19 are in a state of rapid change based on information released by regulatory bodies including the CDC and federal and state organizations. These policies and algorithms were followed during the patient's care in the ED.    MDM Rules/Calculators/A&P                      Patient with COVID-19 pneumonia and new oxygen requirement.    Final Clinical Impression(s) / ED Diagnoses Final diagnoses:  Pneumonia due to COVID-19 virus  Hypoxia    Rx / DC Orders ED Discharge Orders    None       Carlisle Cater, PA-C 04/07/19 [place], MD 04/12/19 1757

## 2019-04-07 NOTE — ED Notes (Signed)
Got patient on the monitor into a gown did ekg shown to er doctor patient is resting with nurse at bedside and call bell in reach 

## 2019-04-07 NOTE — ED Notes (Signed)
Wife Dorothy's number, [phone].

## 2019-04-07 NOTE — Progress Notes (Signed)
Patient arrived via [place]. Patient oriented to room and assisted with ambulation to the restroom. Connected to tele,VSS, O2 saturation >93%. Telephone and call bell placed within reach and bed locked and in lowest position. Warrick Parisian, RN

## 2019-04-07 NOTE — Progress Notes (Signed)
Patient states he brought his home medications with him.  They are not in the room.  Spoke with Otila Kluver at [facility] and she states that [facility] left them on the table in the ED.  She will have them sent over as soon as she can make arrangements.  Patient made aware.  Earleen Reaper RN

## 2019-04-07 NOTE — ED Triage Notes (Signed)
Pt arrives to ED from [facility] living in [place] with complaints of being COVID+ 1/2. Patients states he is not short of breath but his home O2 saturation was 88% on room air.

## 2019-04-07 NOTE — H&P (Signed)
History and Physical    [name]. [id number] DOB: 1924-06-30 DOA: 04/07/2019  PCP: Nicoletta Dress, MD Consultants:  None Patient coming from: [place]; NOK: Etta Quill, [phone]; daughter, [phone]  Chief Complaint:  Worsening COVID symptoms  HPI: Reinhold Atkerson. is a 86 y.o. male with medical history significant of glaucoma; HLD; branch retinal vein occlusion; OSA on CPAP; and prostate CA presenting with SOB and hypoxia in the setting of COVID-19 infection.  He reports onset of symptoms - URI symptoms with cough and SOB - on 12/26.  He was sick for a few days and took "medications" (NOS) for bronchitis without improvement.  He finally went to [facility] ER about 4 days ago and tested positive for COVID-19.  Since then, he has had persistent cough and progressive SOB.  He is having night sweats, and had to change his clothes twice last night.  He lives in a small apartment with his wife, who has not yet been tested but is asymptomatic.      ED Course:  COVID, low-to-mid 90s on 4L.  Lives independently normally.  This is day 11 of his illness, initially treated for bronchitis and worsened so tested for COVID, +4 days ago.  CXR c/w PNA.  Review of Systems: As per HPI; otherwise review of systems reviewed and negative.    Past Medical History:  Diagnosis Date  . Branch retinal vein occlusion   . Dyslipidemia   . Glaucoma   . History of prostate cancer   . OSA on CPAP     Past Surgical History:  Procedure Laterality Date  . VIDEO ASSISTED THORACOSCOPY (VATS)/DECORTICATION  2012    Social History   Socioeconomic History  . Marital status: Married    Spouse name: Not on file  . Number of children: Not on file  . Years of education: Not on file  . Highest education level: Not on file  Occupational History  . Occupation: retired  Tobacco Use  . Smoking status: Former Smoker    Years: 40.00  . Smokeless tobacco: Never Used   Substance and Sexual Activity  . Alcohol use: Yes    Alcohol/week: 7.0 standard drinks    Types: 7 Glasses of wine per week  . Drug use: Never  . Sexual activity: Not on file  Other Topics Concern  . Not on file  Social History Narrative  . Not on file   Social Determinants of Health   Financial Resource Strain:   . Difficulty of Paying Living Expenses: Not on file  Food Insecurity:   . Worried About Charity fundraiser in the Last Year: Not on file  . Ran Out of Food in the Last Year: Not on file  Transportation Needs:   . Lack of Transportation (Medical): Not on file  . Lack of Transportation (Non-Medical): Not on file  Physical Activity:   . Days of Exercise per Week: Not on file  . Minutes of Exercise per Session: Not on file  Stress:   . Feeling of Stress : Not on file  Social Connections:   . Frequency of Communication with Friends and Family: Not on file  . Frequency of Social Gatherings with Friends and Family: Not on file  . Attends Religious Services: Not on file  . Active Member of Clubs or Organizations: Not on file  . Attends Archivist Meetings: Not on file  . Marital Status: Not on file  Intimate Partner Violence:   .  Fear of Current or Ex-Partner: Not on file  . Emotionally Abused: Not on file  . Physically Abused: Not on file  . Sexually Abused: Not on file    Allergies  Allergen Reactions  . Brimonidine Other (See Comments)    The Generic causes inflammation in OU    History reviewed. No pertinent family history.  Prior to Admission medications   Medication Sig Start Date End Date Taking? Authorizing Provider  acebutolol (SECTRAL) 400 MG capsule Take 400 mg by mouth daily. 03/16/19  Yes [provider]  aspirin EC 81 MG tablet Take 81 mg by mouth daily.   Yes [provider]  Azelastine HCl 0.15 % SOLN Place 2 sprays into both nostrils daily. 03/29/19  Yes [provider]  Calcium Carbonate-Vit D-Min  (CALCIUM 1200 PO) Take 1,200 mg by mouth daily.   Yes [provider]  cholecalciferol (VITAMIN D3) 25 MCG (1000 UT) tablet Take 1,000 Units by mouth daily.   Yes [provider]  DIOVAN 160 MG tablet Take 160 mg by mouth daily. 11/30/18  Yes [provider]  dorzolamide-timolol (COSOPT) 22.3-6.8 MG/ML ophthalmic solution Place 1 drop into both eyes 2 (two) times daily. 03/22/19  Yes [provider]  ibuprofen (ADVIL) 200 MG tablet Take 200-400 mg by mouth every 6 (six) hours as needed for fever.   Yes [provider]  Misc Natural Products (GLUCOSAMINE CHOND DOUBLE STR PO) Take 1 tablet by mouth daily.   Yes [provider]  Multiple Vitamins-Minerals (PRESERVISION AREDS 2+MULTI VIT) CAPS Take 1 capsule by mouth 2 (two) times daily.   Yes [provider]  simvastatin (ZOCOR) 20 MG tablet Take 20 mg by mouth every evening.   Yes [provider]    Physical Exam: Vitals:   04/07/19 1530 04/07/19 1546 04/07/19 1630 04/07/19 1700  BP: (!) 114/57  (!) 106/59 140/70  Pulse: 84  81 79  Resp: 20  (!) 24 (!) 25  Temp:      TempSrc:      SpO2: 93%  94% 95%  Weight:  86.2 kg       . General:  Appears calm and comfortable and is NAD on 4L [place] O2 . Eyes:  PERRL, EOMI, normal lids, iris . ENT:  grossly normal hearing, lips & tongue, mmm; appropriate dentition . Neck:  no LAD, masses or thyromegaly . Cardiovascular:  RRR, no m/r/g. No LE edema.  Marland Kitchen Respiratory:   CTA bilaterally with no wheezes/rales/rhonchi.  Normal to mildly increased respiratory effort. . Abdomen:  soft, NT, ND, NABS . Skin:  no rash or induration seen on limited exam . Musculoskeletal:  grossly normal tone BUE/BLE, good ROM, no bony abnormality . Psychiatric:  grossly normal mood and affect, speech fluent and appropriate, AOx3 . Neurologic:  CN 2-12 grossly intact, moves all extremities in coordinated fashion, sensation intact    Radiological Exams on  Admission: DG Chest Port 1 View  Result Date: 04/07/2019 CLINICAL DATA:  Shortness of breath.  COVID positive EXAM: PORTABLE CHEST 1 VIEW COMPARISON:  04/03/2019 FINDINGS: New patchy streaky opacities noted at the left lung base and in the right mid lung. Findings concerning for early infiltrate/pneumonia. Heart is normal size. No effusions or acute bony abnormality. IMPRESSION: Streaky opacities at the left lung base and the right mid lung concerning for early infiltrates/pneumonia. Electronically Signed   By: Rolm Baptise M.D.   On: 04/07/2019 11:15    EKG: Independently reviewed.  NSR with rate  87; incomplete LBBB; LVH; nonspecific ST changes   Labs on Admission: I have personally reviewed the available labs and imaging studies at the time of the admission.  Pertinent labs:   Na++ 127 BUN 27/Creatinine 1.30/GFR 47 Albumin 3.4 LDH 204 Ferritin 455 CRP 16.1 WBC 6.1, lymphocytes 0.3 Hgb 11.7 Platelets 80 D-dimer 0.98 Fibrinogen 765 BD COVID POSITIVE Blood cultures pending  Assessment/Plan Principal Problem:   Acute respiratory disease due to COVID-19 virus Active Problems:   OSA on CPAP   History of prostate cancer   Glaucoma   Dyslipidemia   Branch retinal vein occlusion   Acute respiratory failure with hypoxia -Patient with presenting with SOB and cough, worsening in the setting of known COVID-19 infection (diagnosed at [facility] about 4 days ago) -He does not have a usual home O2 requirement and is currently requiring 4L [place] O2 -COVID POSITIVE -The patient has comorbidities which may increase the risk for ARDS/MODS including: age -Exam is concerning for development of ARDS/MODS due to respiratory distress (improved now on O2) -Pertinent labs concerning for COVID include lymphopenia; increased BUN/Creatinine;  elevated D-dimer (not >1); markedly elevated CRP (>>7); elevated troponin; increased LDH; increased ferritin; increased fibrinogen -CXR with multifocal opacities which may  be c/w COVID vs. Multifocal PNA -Will treat with broad-spectrum antibiotics given procalcitonin >0.1.   -Will admit to [facility] for further evaluation, close monitoring, and treatment -Monitor on telemetry x at least 24 hours -At this time, will attempt to avoid use of aerosolized medications and use HFAs instead -Will check daily labs including BMP with Mag, Phos; LFTs; CBC with differential; CRP; ferritin; fibrinogen; D-dimer  -Will order steroids (1 mg/kg divided BID) and Remdesivir (pharmacy consult) given +COVID test, +CXR, and hypoxia <94% on room air -If the patient shows clinical deterioration, consider transfer to ICU with PCCM consultation -Will attempt to maintain euvolemia to a net negative fluid status -Will ask the patient to maintain an awake prone position for 16+ hours a day, if possible, with a minimum of 2-3 hours at a time -With D-dimer <5, will use standard-dosed Lovenox for DVT prevention -Patient was seen wearing full PPE including: gown, gloves, head cover, N95, and face shield; donning and doffing was in compliance with current standards.  OSA on CPAP -No CPAP due to aerosolization of the COVID particles  HTN -Continue acebutolol and Diovan  HLD -Continue Zocor  H/o prostate CA -Treated with radiation therapy in [place] remotely  H/o branch retinal vein occlusion -Continue ASA -Treated with Avastin remotely  Glaucoma -s/p laser procedure -Continue Cosopt    DVT prophylaxis:  Lovenox  Code Status:  DNR - confirmed with patient Family Communication: None present; I spoke with the patient's wife by telephone. Disposition Plan:  Home once clinically improved Consults called: None  Admission status: Admit - It is my clinical opinion that admission to INPATIENT is reasonable and necessary because of the expectation that this patient will require hospital care that crosses at least 2 midnights to treat this condition based on the medical complexity of the  problems presented.  Given the aforementioned information, the predictability of an adverse outcome is felt to be significant.      Karmen Bongo MD [facility]   How to contact the [facility] Attending or Consulting provider [place] or covering provider during after hours [place], for this patient?  1. Check the care team in [facility] and look for a) attending/consulting TRH provider listed and b) the [facility] team listed 2. Log into  [email or web address] and use [place]'s universal password to access. If you do not have the password, please contact the hospital operator. 3. Locate the [facility] provider you are looking for under [facility] and page to a number that you can be directly reached. 4. If you still have difficulty reaching the provider, please page the [facility] (Director on Call) for the Hospitalists listed on amion for assistance.   04/07/2019, 5:34 PM

## 2019-04-08 DIAGNOSIS — H401133 Primary open-angle glaucoma, bilateral, severe stage: Secondary | ICD-10-CM

## 2019-04-08 DIAGNOSIS — G4733 Obstructive sleep apnea (adult) (pediatric): Secondary | ICD-10-CM

## 2019-04-08 DIAGNOSIS — H34832 Tributary (branch) retinal vein occlusion, left eye, with macular edema: Secondary | ICD-10-CM

## 2019-04-08 DIAGNOSIS — Z8546 Personal history of malignant neoplasm of prostate: Secondary | ICD-10-CM

## 2019-04-08 DIAGNOSIS — Z9989 Dependence on other enabling machines and devices: Secondary | ICD-10-CM

## 2019-04-08 DIAGNOSIS — E785 Hyperlipidemia, unspecified: Secondary | ICD-10-CM

## 2019-04-08 LAB — COMPREHENSIVE METABOLIC PANEL
ALT: 16 U/L (ref 0–44)
AST: 21 U/L (ref 15–41)
Albumin: 3 g/dL — ABNORMAL LOW (ref 3.5–5.0)
Alkaline Phosphatase: 46 U/L (ref 38–126)
Anion gap: 12 (ref 5–15)
BUN: 28 mg/dL — ABNORMAL HIGH (ref 8–23)
CO2: 19 mmol/L — ABNORMAL LOW (ref 22–32)
Calcium: 8.3 mg/dL — ABNORMAL LOW (ref 8.9–10.3)
Chloride: 100 mmol/L (ref 98–111)
Creatinine, Ser: 1.01 mg/dL (ref 0.61–1.24)
GFR calc Af Amer: >60 mL/min (ref >60)
GFR calc non Af Amer: >60 mL/min (ref >60)
Glucose, Bld: 128 mg/dL — ABNORMAL HIGH (ref 70–99)
Potassium: 3.9 mmol/L (ref 3.5–5.1)
Sodium: 131 mmol/L — ABNORMAL LOW (ref 135–145)
Total Bilirubin: 0.9 mg/dL (ref 0.3–1.2)
Total Protein: 5.7 g/dL — ABNORMAL LOW (ref 6.5–8.1)

## 2019-04-08 LAB — CBC WITH DIFFERENTIAL/PLATELET
Abs Immature Granulocytes: 0.19 10*3/uL — ABNORMAL HIGH (ref 0.00–0.07)
Basophils Absolute: 0 10*3/uL (ref 0.0–0.1)
Basophils Relative: 0 %
Eosinophils Absolute: 0 10*3/uL (ref 0.0–0.5)
Eosinophils Relative: 0 %
HCT: 31.7 % — ABNORMAL LOW (ref 39.0–52.0)
Hemoglobin: 10.9 g/dL — ABNORMAL LOW (ref 13.0–17.0)
Immature Granulocytes: 5 %
Lymphocytes Relative: 8 %
Lymphs Abs: 0.3 10*3/uL — ABNORMAL LOW (ref 0.7–4.0)
MCH: 31 pg (ref 26.0–34.0)
MCHC: 34.4 g/dL (ref 30.0–36.0)
MCV: 90.1 fL (ref 80.0–100.0)
Monocytes Absolute: 0.3 10*3/uL (ref 0.1–1.0)
Monocytes Relative: 8 %
Neutro Abs: 3 10*3/uL (ref 1.7–7.7)
Neutrophils Relative %: 79 %
Platelets: 78 10*3/uL — ABNORMAL LOW (ref 150–400)
RBC: 3.52 MIL/uL — ABNORMAL LOW (ref 4.22–5.81)
RDW: 12.9 % (ref 11.5–15.5)
WBC: 3.8 10*3/uL — ABNORMAL LOW (ref 4.0–10.5)
nRBC: 0 % (ref 0.0–0.2)

## 2019-04-08 LAB — PHOSPHORUS: Phosphorus: 3.5 mg/dL (ref 2.5–4.6)

## 2019-04-08 LAB — MAGNESIUM: Magnesium: 1.9 mg/dL (ref 1.7–2.4)

## 2019-04-08 LAB — FERRITIN: Ferritin: 467 ng/mL — ABNORMAL HIGH (ref 24–336)

## 2019-04-08 LAB — D-DIMER, QUANTITATIVE: D-Dimer, Quant: 0.77 ug/mL-FEU — ABNORMAL HIGH (ref 0.00–0.50)

## 2019-04-08 LAB — C-REACTIVE PROTEIN: CRP: 15.7 mg/dL — ABNORMAL HIGH (ref <1.0)

## 2019-04-08 MED ORDER — HOME MED STORE IN PYXIS: 1.0000 | Freq: Two times a day (BID) | Status: DC

## 2019-04-08 MED ORDER — VALSARTAN 160 MG PO TABS
160.0000 mg | ORAL_TABLET | Freq: Two times a day (BID) | ORAL | Status: DC
  Administered 2019-04-08 – 2019-04-11 (×7): 160 mg via ORAL
  Filled 2019-04-08 (×23): qty 1

## 2019-04-08 MED ORDER — DEXAMETHASONE 6 MG PO TABS
6.0000 mg | ORAL_TABLET | Freq: Two times a day (BID) | ORAL | Status: DC
  Administered 2019-04-08 – 2019-04-09 (×3): 6 mg via ORAL
  Filled 2019-04-08 (×3): qty 1

## 2019-04-08 NOTE — Plan of Care (Signed)
  Problem: Education: Goal: Knowledge of risk factors and measures for prevention of condition will improve Outcome: Progressing   Problem: Coping: Goal: Psychosocial and spiritual needs will be supported Outcome: Progressing   Problem: Respiratory: Goal: Will maintain a patent airway Outcome: Progressing   

## 2019-04-08 NOTE — Progress Notes (Signed)
PROGRESS NOTE  Jimmy Grant.  [id number] DOB: [birth year] DOA: 04/07/2019 PCP: Nicoletta Dress, MD   Brief Narrative: Jimmy Grant. is a 86 y.o. male with a history of OSA on CPAP, prostate CA, branch retinal vein occlusion, HLD, and glaucoma who presented from ILF to the ED 1/6 for progressive URI symptoms with cough and dyspnea that began 12/26. He had taken steroids for bronchitis before ultimately being diagnosed with covid-19. In the ED he was hypoxic requiring 4L O2 with CXR findings of peripheral bilateral opacities, SARS-CoV-2 positivity confirmed. Remdesivir, decadron, and supplemental oxygen were provided and patient admitted to [facility], since weaned to 2L O2.  Assessment & Plan: Principal Problem:   Acute respiratory disease due to COVID-19 virus Active Problems:   OSA on CPAP   History of prostate cancer   Glaucoma   Dyslipidemia   Branch retinal vein occlusion  Acute hypoxic respiratory failure due to covid-19 pneumonia, possible superimposed bacterial CAP: Felt to be at risk of progression to ARDS. Appears comfortable and on only 2L on the tail end of 2nd week since symptom onset but advanced age and inflammatory marker elevations are worrisome.  - Continue remdesivir 1/6 - 1/10 - Continue decadron 1/6 - 1/15. CRP grossly elevated, will augment dosing for now. PCT noted to be 0.62, will continue empiric CTX, azithromycin - No indication for off-label or investigative therapies at this time with clinical stability.  - Continue airborne, contact precautions.  - Check daily labs: CBC w/diff, CMP, d-dimer, CRP - Enoxaparin prophylactic dose. - Blood cultures drawn, NGTD.  - Maintain euvolemia/net negative.  - Avoid NSAIDs - Recommend proning and aggressive use of incentive spirometry. - Crackles prominent at bases so strongly encouraging IS, OOB, PT/OT  Hyperlipidemia:  - Continue statin. LFTs wnl  Open angle glaucoma followed by Dr. Katy Fitch, branch  retinal vein occlusion OS followed by Dr. Cordelia Pen, [facility], treated with avastin in the past, currently holding antiVEGF.  - Continue home gtt's and ASA. OT consulted.  History of prostate CA s/p XRT.  History of SVT, HTN: SVT worsened when OSA not treated. - Continue acebutolol, ARB - Continue telemetry x24 hours.  OSA on CPAP:  - Avoid CPAP if able given active covid infection.  DVT prophylaxis: Lovenox 40mg  q24h Code Status: DNR Family Communication: None at bedside Disposition Plan: Uncertain, pending clinical course.  Consultants:   None  Procedures:   None  Antimicrobials:  Remdesivir 1/6 - 1/10   Subjective: Shortness of breath has improved, remains worse with exertion, mild-moderate, associated with some cough, minimal production. No fevers. Wife with covid, asymptomatic, 91yo   Objective: Vitals:   04/07/19 2235 04/08/19 0420 04/08/19 0715 04/08/19 0800  BP: 140/65 120/64 (!) 149/72 (!) 153/71  Pulse: 65 72 74 70  Resp: 17 16 17 20   Temp: 97.7 F (36.5 C) (!) 97.4 F (36.3 C) 97.6 F (36.4 C)   TempSrc: Oral Oral Oral   SpO2: 100% 95% 96% 97%  Weight:        Intake/Output Summary (Last 24 hours) at 04/08/2019 1120 Last data filed at 04/08/2019 0300 Gross per 24 hour  Intake 1275 ml  Output 200 ml  Net 1075 ml   Filed Weights   04/07/19 1546  Weight: 86.2 kg    Gen: Nontoxic elderly gentleman in no distress sitting in chair Pulm: Non-labored breathing 2L O2. No crackles or wheezes.  CV: Regular rate and rhythm. No murmur, rub, or gallop. No JVD, no pedal  edema. GI: Abdomen soft, non-tender, non-distended, with normoactive bowel sounds. No organomegaly or masses felt. Ext: Warm, no deformities Skin: No rashes, lesions or ulcers Neuro: Alert and oriented. No focal neurological deficits. Psych: Judgement and insight appear normal. Mood & affect appropriate.   Data Reviewed: I have personally reviewed following labs and imaging  studies  CBC: Recent Labs  Lab 04/07/19 1039 04/08/19 0235  WBC 6.1 3.8*  NEUTROABS 4.5 3.0  HGB 11.7* 10.9*  HCT 34.3* 31.7*  MCV 90.5 90.1  PLT 80* 78*   Basic Metabolic Panel: Recent Labs  Lab 04/07/19 1039 04/08/19 0235  NA 127* 131*  K 4.0 3.9  CL 96* 100  CO2 22 19*  GLUCOSE 102* 128*  BUN 27* 28*  CREATININE 1.30* 1.01  CALCIUM 8.7* 8.3*  MG  --  1.9  PHOS  --  3.5   GFR: CrCl cannot be calculated (Unknown ideal weight.). Liver Function Tests: Recent Labs  Lab 04/07/19 1039 04/08/19 0235  AST 25 21  ALT 20 16  ALKPHOS 53 46  BILITOT 0.7 0.9  PROT 6.3* 5.7*  ALBUMIN 3.4* 3.0*   No results for input(s): LIPASE, AMYLASE in the last 168 hours. No results for input(s): AMMONIA in the last 168 hours. Coagulation Profile: No results for input(s): INR, PROTIME in the last 168 hours. Cardiac Enzymes: No results for input(s): CKTOTAL, CKMB, CKMBINDEX, TROPONINI in the last 168 hours. BNP (last 3 results) No results for input(s): PROBNP in the last 8760 hours. HbA1C: No results for input(s): HGBA1C in the last 72 hours. CBG: No results for input(s): GLUCAP in the last 168 hours. Lipid Profile: Recent Labs    04/07/19 1039  TRIG 124   Thyroid Function Tests: No results for input(s): TSH, T4TOTAL, FREET4, T3FREE, THYROIDAB in the last 72 hours. Anemia Panel: Recent Labs    04/07/19 1039 04/08/19 0235  FERRITIN 455* 467*   Urine analysis: No results found for: COLORURINE, APPEARANCEUR, LABSPEC, PHURINE, GLUCOSEU, HGBUR, BILIRUBINUR, KETONESUR, PROTEINUR, UROBILINOGEN, NITRITE, LEUKOCYTESUR No results found for this or any previous visit (from the past 240 hour(s)).    Radiology Studies: DG Chest Port 1 View  Result Date: 04/07/2019 CLINICAL DATA:  Shortness of breath.  COVID positive EXAM: PORTABLE CHEST 1 VIEW COMPARISON:  04/03/2019 FINDINGS: New patchy streaky opacities noted at the left lung base and in the right mid lung. Findings concerning  for early infiltrate/pneumonia. Heart is normal size. No effusions or acute bony abnormality. IMPRESSION: Streaky opacities at the left lung base and the right mid lung concerning for early infiltrates/pneumonia. Electronically Signed   By: Rolm Baptise M.D.   On: 04/07/2019 11:15    Scheduled Meds: . acebutolol  400 mg Oral Daily  . aspirin EC  81 mg Oral Daily  . azelastine  2 spray Each Nare Daily  . cholecalciferol  1,000 Units Oral Daily  . dexamethasone  6 mg Oral Q24H  . dorzolamide-timolol  1 drop Both Eyes BID  . enoxaparin (LOVENOX) injection  40 mg Subcutaneous Q24H  . multivitamin  1 tablet Oral Daily  . simvastatin  20 mg Oral QPM  . sodium chloride flush  3 mL Intravenous Q12H  . valsartan  160 mg Oral BID   Continuous Infusions: . sodium chloride    . azithromycin 500 mg (04/07/19 2151)  . cefTRIAXone (ROCEPHIN)  IV Stopped (04/07/19 2030)  . remdesivir 100 mg in NS 100 mL 100 mg (04/08/19 0939)     LOS: 1 day  Time spent: 25 minutes.  Patrecia Pour, MD [facility] [email or web address] 04/08/2019, 11:20 AM

## 2019-04-09 LAB — CBC WITH DIFFERENTIAL/PLATELET
Abs Immature Granulocytes: 0.17 10*3/uL — ABNORMAL HIGH (ref 0.00–0.07)
Basophils Absolute: 0 10*3/uL (ref 0.0–0.1)
Basophils Relative: 0 %
Eosinophils Absolute: 0 10*3/uL (ref 0.0–0.5)
Eosinophils Relative: 0 %
HCT: 31.6 % — ABNORMAL LOW (ref 39.0–52.0)
Hemoglobin: 10.8 g/dL — ABNORMAL LOW (ref 13.0–17.0)
Immature Granulocytes: 2 %
Lymphocytes Relative: 7 %
Lymphs Abs: 0.6 10*3/uL — ABNORMAL LOW (ref 0.7–4.0)
MCH: 30.6 pg (ref 26.0–34.0)
MCHC: 34.2 g/dL (ref 30.0–36.0)
MCV: 89.5 fL (ref 80.0–100.0)
Monocytes Absolute: 0.4 10*3/uL (ref 0.1–1.0)
Monocytes Relative: 5 %
Neutro Abs: 6.6 10*3/uL (ref 1.7–7.7)
Neutrophils Relative %: 86 %
Platelets: 145 10*3/uL — ABNORMAL LOW (ref 150–400)
RBC: 3.53 MIL/uL — ABNORMAL LOW (ref 4.22–5.81)
RDW: 13.2 % (ref 11.5–15.5)
WBC: 7.7 10*3/uL (ref 4.0–10.5)
nRBC: 0 % (ref 0.0–0.2)

## 2019-04-09 LAB — D-DIMER, QUANTITATIVE: D-Dimer, Quant: 0.4 ug/mL-FEU (ref 0.00–0.50)

## 2019-04-09 LAB — COMPREHENSIVE METABOLIC PANEL
ALT: 24 U/L (ref 0–44)
AST: 22 U/L (ref 15–41)
Albumin: 2.9 g/dL — ABNORMAL LOW (ref 3.5–5.0)
Alkaline Phosphatase: 50 U/L (ref 38–126)
Anion gap: 10 (ref 5–15)
BUN: 41 mg/dL — ABNORMAL HIGH (ref 8–23)
CO2: 21 mmol/L — ABNORMAL LOW (ref 22–32)
Calcium: 8.8 mg/dL — ABNORMAL LOW (ref 8.9–10.3)
Chloride: 104 mmol/L (ref 98–111)
Creatinine, Ser: 1.05 mg/dL (ref 0.61–1.24)
GFR calc Af Amer: >60 mL/min (ref >60)
GFR calc non Af Amer: >60 mL/min (ref >60)
Glucose, Bld: 159 mg/dL — ABNORMAL HIGH (ref 70–99)
Potassium: 4 mmol/L (ref 3.5–5.1)
Sodium: 135 mmol/L (ref 135–145)
Total Bilirubin: 0.2 mg/dL — ABNORMAL LOW (ref 0.3–1.2)
Total Protein: 5.8 g/dL — ABNORMAL LOW (ref 6.5–8.1)

## 2019-04-09 LAB — C-REACTIVE PROTEIN: CRP: 8.9 mg/dL — ABNORMAL HIGH (ref <1.0)

## 2019-04-09 MED ORDER — SALINE SPRAY 0.65 % NA SOLN
1.0000 | NASAL | Status: DC | PRN
  Administered 2019-04-09: 1 via NASAL
  Filled 2019-04-09: qty 44

## 2019-04-09 NOTE — Evaluation (Signed)
Occupational Therapy Evaluation Patient Details Name: Jimmy Grant. MRN: [id number] DOB: 01/03/1925 Today's Date: 04/09/2019    History of Present Illness 86 yo male presenting to ED (04/07/19) with cough and dyspnea that began 03/27/19. In the ED he was hypoxic requiring 4L O2 with CXR findings of peripheral bilateral opacities, SARS-CoV-2 positivity confirmed. PMH including OSA on CPAP, prostate CA, branch retinal vein occlusion, HLD, and glaucoma.   Clinical Impression   PTA, pt was living with his wife and [place] and was independent with BADLs not using DME for mobility. Pt very active reporting he normally exercises each day. Pt currently requiring Min Guard A for LB ADLs and functional mobility with RW. Pt requiring 2L O2 to maintain SpO2 in 90s (O2 monitor on earlobe). Pt would benefit from further acute OT to facilitate safe dc. Recommend dc to home with HHOT for further OT to optimize safety, independence with ADLs, and return to PLOF.      Follow Up Recommendations  Home health OT;Supervision/Assistance - 24 hour    Equipment Recommendations  None recommended by OT    Recommendations for Other Services PT consult     Precautions / Restrictions Precautions Precautions: Fall      Mobility Bed Mobility Overal bed mobility: Needs Assistance Bed Mobility: Supine to Sit     Supine to sit: Supervision     General bed mobility comments: Supervision for safety  Transfers Overall transfer level: Needs assistance Equipment used: Rolling Uddin (2 wheeled) Transfers: Sit to/from Stand Sit to Stand: Min guard         General transfer comment: MIn Guard A for safety    Balance Overall balance assessment: Needs assistance Sitting-balance support: No upper extremity supported;Feet supported Sitting balance-Leahy Scale: Good     Standing balance support: No upper extremity supported;During functional activity Standing balance-Leahy Scale:  Fair Standing balance comment: Able to maintain standing balance at sink. Benefits from RW for mobility                           ADL either performed or assessed with clinical judgement   ADL Overall ADL's : Needs assistance/impaired Eating/Feeding: Independent;Sitting   Grooming: Oral care;Supervision/safety;Standing Grooming Details (indicate cue type and reason): Pt performing oral care at sink with Supervision for safety Upper Body Bathing: Supervision/ safety;Sitting   Lower Body Bathing: Min guard;Sit to/from stand   Upper Body Dressing : Supervision/safety;Sitting   Lower Body Dressing: Min guard;Sit to/from stand Lower Body Dressing Details (indicate cue type and reason): Pt donning his slippers while sitting at EOB. Min Guard A foir safety in standing Toilet Transfer: Min guard;Ambulation;RW(simulated to recliner) Armed forces technical officer Details (indicate cue type and reason): Min Guard A for safety         Functional mobility during ADLs: Min guard;Rolling Violet General ADL Comments: Pt presenting with decreased acitivyt tolerance compared to his baseline. Pt requring a RW for balance and fatigue and reports he feels more SOB after mobility in hallway. SpO2 in 90s on 2L throughout     Vision Baseline Vision/History: Wears glasses;Glaucoma Wears Glasses: At all times Patient Visual Report: No change from baseline("Everything is dim")       Perception     Praxis      Pertinent Vitals/Pain Pain Assessment: No/denies pain     Hand Dominance Right   Extremity/Trunk Assessment Upper Extremity Assessment Upper Extremity Assessment: Generalized weakness   Lower Extremity Assessment Lower Extremity  Assessment: Generalized weakness   Cervical / Trunk Assessment Cervical / Trunk Assessment: Kyphotic   Communication Communication Communication: No difficulties   Cognition Arousal/Alertness: Awake/alert Behavior During Therapy: WFL for tasks  assessed/performed Overall Cognitive Status: Within Functional Limits for tasks assessed                                 General Comments: Very intelligent and pleasent. Eager to participate in therapy and return to PLOF.   General Comments  SpO2 maintaining in 90s on 2L with O2 monitor on earlobe    Exercises     Shoulder Instructions      Home Living Family/patient expects to be discharged to:: Other (Comment)(ILF at [facility] in [place]) Living Arrangements: Spouse/significant other Available Help at Discharge: Family;Available 24 hours/day Type of Home: Apartment(third floor) Home Access: Elevator     Home Layout: One level     Bathroom Shower/Tub: Hospital doctor Toilet: Handicapped height     Home Equipment: Environmental consultant - 2 wheels;Shower seat;Grab bars - tub/shower          Prior Functioning/Environment Level of Independence: Independent        Comments: Performed BADLs and did not use DME. Pt reports that the ILF provides dinner. He also would exericses every day for 30 minutes in the gym.         OT Problem List: Decreased strength;Decreased activity tolerance;Impaired balance (sitting and/or standing);Decreased knowledge of use of DME or AE;Decreased knowledge of precautions;Cardiopulmonary status limiting activity      OT Treatment/Interventions: Self-care/ADL training;Therapeutic exercise;Energy conservation;DME and/or AE instruction;Therapeutic activities;Patient/family education    OT Goals(Current goals can be found in the care plan section) Acute Rehab OT Goals Patient Stated Goal: "Go home" OT Goal Formulation: With patient Time For Goal Achievement: 04/23/19 Potential to Achieve Goals: Good  OT Frequency: Min 2X/week   Barriers to D/C:            Co-evaluation              AM-PAC OT "6 Clicks" Daily Activity     Outcome Measure Help from another person eating meals?: None Help from another person  taking care of personal grooming?: None Help from another person toileting, which includes using toliet, bedpan, or urinal?: A Little Help from another person bathing (including washing, rinsing, drying)?: A Little Help from another person to put on and taking off regular upper body clothing?: None Help from another person to put on and taking off regular lower body clothing?: A Little 6 Click Score: 21   End of Session Equipment Utilized During Treatment: Rolling Essman;Oxygen(2L) Nurse Communication: Mobility status  Activity Tolerance: Patient tolerated treatment well Patient left: in chair;with call bell/phone within reach;with nursing/sitter in room  OT Visit Diagnosis: Unsteadiness on feet (R26.81);Other abnormalities of gait and mobility (R26.89);Muscle weakness (generalized) (M62.81)                Time: [id number] OT Time Calculation (min): 34 min Charges:  OT General Charges $OT Visit: 1 Visit OT Evaluation $OT Eval Moderate Complexity: 1 Mod OT Treatments $Self Care/Home Management : 8-22 mins  Awab Abebe MSOT, OTR/L Acute Rehab Pager: [phone] Office: [place] 04/09/2019, 2:09 PM

## 2019-04-09 NOTE — Progress Notes (Signed)
PROGRESS NOTE  Jimmy Grant.  [id number] DOB: 02/21/1925 DOA: 04/07/2019 PCP: Nicoletta Dress, MD   Brief Narrative: Jimmy Alley Kinzer Horney. is a 86 y.o. male with a history of OSA on CPAP, prostate CA, branch retinal vein occlusion, HLD, and glaucoma who presented from ILF to the ED 1/6 for progressive URI symptoms with cough and dyspnea that began 12/26. He had taken steroids for bronchitis before ultimately being diagnosed with covid-19. In the ED he was hypoxic requiring 4L O2 with CXR findings of peripheral bilateral opacities, SARS-CoV-2 positivity confirmed. Remdesivir, decadron, and supplemental oxygen were provided and patient admitted to [facility], since weaned to 2L O2.  Assessment & Plan: Principal Problem:   Acute respiratory disease due to COVID-19 virus Active Problems:   OSA on CPAP   History of prostate cancer   Glaucoma   Dyslipidemia   Branch retinal vein occlusion  Acute hypoxic respiratory failure due to covid-19 pneumonia, possible superimposed bacterial CAP: Felt to be at risk of progression to ARDS. Appears comfortable and on only 2L on the tail end of 2nd week since symptom onset but advanced age and inflammatory marker elevations are worrisome.  - Continue remdesivir 1/6 - 1/10 - Continue decadron 1/6 - 1/15. CRP grossly elevated, augmented decadron to 6mg  IV q12h on 1/7, CRP down 15.7 > 8.9. - PCT noted to be 0.62, will continue empiric CTX, azithromycin - No indication for off-label or investigative therapies at this time with clinical stability.  - Continue airborne, contact precautions.  - Check daily labs: CBC w/diff, CMP, CRP.  - Enoxaparin prophylactic dose. D-dimer negative. - Blood cultures drawn, NGTD.  - Maintain euvolemia/net negative.  - Avoid NSAIDs - Recommend proning and aggressive use of incentive spirometry. - Crackles remains, continue IS, OOB, PT/OT  Hyperlipidemia:  - Continue statin. LFTs wnl  Open angle glaucoma  followed by Dr. Katy Fitch, branch retinal vein occlusion OS followed by Dr. Cordelia Pen, [facility], treated with avastin in the past, currently holding antiVEGF.  - Continue home gtt's and ASA. OT consulted.  History of prostate CA s/p XRT.  History of SVT, HTN: No telemetry events (only NSR on my personal review today), will DC tele.  - Continue acebutolol, ARB  OSA on CPAP:  - Avoid CPAP if able given active covid infection.  DVT prophylaxis: Lovenox 40mg  q24h Code Status: DNR Family Communication: None at bedside Disposition Plan: If remains clinically stable, able to wean oxygen, may be able to discharge 1/10 after 5th dose of remdesivir. PT/OT consulted.  Consultants:   None  Procedures:   None  Antimicrobials:  Remdesivir 1/6 - 1/10   Subjective: Continues to feel less short of breath at rest, only mild and worse with exertion. No chest pain, +cough, using IS and pulling >2L! Wife remains asymptomatic   Objective: Vitals:   04/09/19 0030 04/09/19 0445 04/09/19 0900 04/09/19 1200  BP: (!) 148/71 118/73 (!) 129/55   Pulse: 60 (!) 59 60   Resp: 14 15 18    Temp: 97.8 F (36.6 C) 97.7 F (36.5 C) 97.7 F (36.5 C)   TempSrc: Oral Axillary Oral   SpO2: 93% 91% 92% 95%  Weight:       Gen: Well-appearing elderly male in no distress Pulm: Nonlabored breathing 2L O2. No wheezes, +crackles. CV: Regular rate and rhythm. No murmur, rub, or gallop. No JVD, no dependent edema. GI: Abdomen soft, non-tender, non-distended, with normoactive bowel sounds.  Ext: Warm, no deformities Skin: No rashes, lesions or  ulcers on visualized skin. Neuro: Alert and oriented. No focal neurological deficits. Psych: Judgement and insight appear fair. Mood euthymic & affect congruent. Behavior is appropriate.    Data Reviewed: I have personally reviewed following labs and imaging studies  CBC: Recent Labs  Lab 04/07/19 1039 04/08/19 0235 04/09/19 0200  WBC 6.1 3.8* 7.7  NEUTROABS 4.5  3.0 6.6  HGB 11.7* 10.9* 10.8*  HCT 34.3* 31.7* 31.6*  MCV 90.5 90.1 89.5  PLT 80* 78* [id number]*   Basic Metabolic Panel: Recent Labs  Lab 04/07/19 1039 04/08/19 0235 04/09/19 0200  NA 127* 131* 135  K 4.0 3.9 4.0  CL 96* 100 104  CO2 22 19* 21*  GLUCOSE 102* 128* 159*  BUN 27* 28* 41*  CREATININE 1.30* 1.01 1.05  CALCIUM 8.7* 8.3* 8.8*  MG  --  1.9  --   PHOS  --  3.5  --    Liver Function Tests: Recent Labs  Lab 04/07/19 1039 04/08/19 0235 04/09/19 0200  AST 25 21 22   ALT 20 16 24   ALKPHOS 53 46 50  BILITOT 0.7 0.9 0.2*  PROT 6.3* 5.7* 5.8*  ALBUMIN 3.4* 3.0* 2.9*   Anemia Panel: Recent Labs    04/07/19 1039 04/08/19 0235  FERRITIN 455* 467*   Recent Results (from the past 240 hour(s))  Blood Culture (routine x 2)     Status: None (Preliminary result)   Collection Time: 04/07/19 10:39 AM   Specimen: BLOOD  Result Value Ref Range Status   Specimen Description BLOOD LEFT ANTECUBITAL  Final   Special Requests   Final    BOTTLES DRAWN AEROBIC AND ANAEROBIC Blood Culture adequate volume   Culture   Final    NO GROWTH 2 DAYS Performed at [facility], [address] [place], [place] [address]    Report Status PENDING  Incomplete  Blood Culture (routine x 2)     Status: None (Preliminary result)   Collection Time: 04/07/19 11:57 AM   Specimen: BLOOD  Result Value Ref Range Status   Specimen Description BLOOD RIGHT ANTECUBITAL  Final   Special Requests   Final    BOTTLES DRAWN AEROBIC ONLY Blood Culture adequate volume   Culture   Final    NO GROWTH 2 DAYS Performed at [facility], [place] [address] [place], [place] [address]    Report Status PENDING  Incomplete       LOS: 2 days   Time spent: 25 minutes.  Patrecia Pour, MD [facility] [email or web address] 04/09/2019, 2:03 PM

## 2019-04-10 LAB — COMPREHENSIVE METABOLIC PANEL
ALT: 31 U/L (ref 0–44)
AST: 26 U/L (ref 15–41)
Albumin: 3 g/dL — ABNORMAL LOW (ref 3.5–5.0)
Alkaline Phosphatase: 46 U/L (ref 38–126)
Anion gap: 9 (ref 5–15)
BUN: 45 mg/dL — ABNORMAL HIGH (ref 8–23)
CO2: 22 mmol/L (ref 22–32)
Calcium: 8.7 mg/dL — ABNORMAL LOW (ref 8.9–10.3)
Chloride: 104 mmol/L (ref 98–111)
Creatinine, Ser: 0.95 mg/dL (ref 0.61–1.24)
GFR calc Af Amer: >60 mL/min (ref >60)
GFR calc non Af Amer: >60 mL/min (ref >60)
Glucose, Bld: 161 mg/dL — ABNORMAL HIGH (ref 70–99)
Potassium: 4.3 mmol/L (ref 3.5–5.1)
Sodium: 135 mmol/L (ref 135–145)
Total Bilirubin: 0.5 mg/dL (ref 0.3–1.2)
Total Protein: 5.6 g/dL — ABNORMAL LOW (ref 6.5–8.1)

## 2019-04-10 LAB — CBC WITH DIFFERENTIAL/PLATELET
Abs Immature Granulocytes: 0.16 10*3/uL — ABNORMAL HIGH (ref 0.00–0.07)
Basophils Absolute: 0 10*3/uL (ref 0.0–0.1)
Basophils Relative: 0 %
Eosinophils Absolute: 0 10*3/uL (ref 0.0–0.5)
Eosinophils Relative: 0 %
HCT: 31.7 % — ABNORMAL LOW (ref 39.0–52.0)
Hemoglobin: 10.7 g/dL — ABNORMAL LOW (ref 13.0–17.0)
Immature Granulocytes: 2 %
Lymphocytes Relative: 5 %
Lymphs Abs: 0.4 10*3/uL — ABNORMAL LOW (ref 0.7–4.0)
MCH: 30.4 pg (ref 26.0–34.0)
MCHC: 33.8 g/dL (ref 30.0–36.0)
MCV: 90.1 fL (ref 80.0–100.0)
Monocytes Absolute: 0.3 10*3/uL (ref 0.1–1.0)
Monocytes Relative: 4 %
Neutro Abs: 7.4 10*3/uL (ref 1.7–7.7)
Neutrophils Relative %: 89 %
Platelets: 178 10*3/uL (ref 150–400)
RBC: 3.52 MIL/uL — ABNORMAL LOW (ref 4.22–5.81)
RDW: 13.2 % (ref 11.5–15.5)
WBC: 8.3 10*3/uL (ref 4.0–10.5)
nRBC: 0 % (ref 0.0–0.2)

## 2019-04-10 LAB — C-REACTIVE PROTEIN: CRP: 4.1 mg/dL — ABNORMAL HIGH (ref <1.0)

## 2019-04-10 MED ORDER — SODIUM CHLORIDE 0.9 % IV SOLN
2.0000 g | INTRAVENOUS | Status: DC
  Administered 2019-04-10: 2 g via INTRAVENOUS
  Filled 2019-04-10 (×2): qty 20

## 2019-04-10 MED ORDER — DEXAMETHASONE 6 MG PO TABS
6.0000 mg | ORAL_TABLET | Freq: Every day | ORAL | Status: DC
  Administered 2019-04-10 – 2019-04-11 (×2): 6 mg via ORAL
  Filled 2019-04-10 (×2): qty 1

## 2019-04-10 NOTE — Progress Notes (Signed)
PROGRESS NOTE  Jimmy Alley Tobi Bastos.  [id number] DOB: 1924-10-22 DOA: 04/07/2019 PCP: Nicoletta Dress, MD   Brief Narrative: Jimmy Grant. is a 86 y.o. male with a history of OSA on CPAP, prostate CA, branch retinal vein occlusion, HLD, and glaucoma who [facility] from [facility] to [facility] ED 1/6 for progressive URI symptoms with cough and dyspnea that began 12/26. He had taken steroids for bronchitis before ultimately being diagnosed with covid-19. In the ED he was hypoxic requiring 4L O2 with CXR findings of peripheral bilateral opacities, SARS-CoV-2 positivity confirmed. Remdesivir, decadron, and supplemental oxygen were provided and patient admitted to [facility], since weaned to 2L O2.  Assessment & Plan: Principal Problem:   Acute respiratory disease due to COVID-19 virus Active Problems:   OSA on CPAP   History of prostate cancer   Glaucoma   Dyslipidemia   Branch retinal vein occlusion  Acute hypoxic respiratory failure due to covid-19 pneumonia, possible superimposed bacterial CAP: Felt to be at risk of progression to ARDS. Appears comfortable and on only 2L on the tail end of 2nd week since symptom onset but advanced age and inflammatory marker elevations are worrisome.  - Continue remdesivir 1/6 - 1/10 - Continue decadron 1/6 - 1/15. CRP improving and symptoms improving, will deescalate to 6mg  daily.   - PCT noted to be 0.62, will complete empiric CTX x5 days prior to discharge, has received azithromycin 500mg  x3 days. - Continue airborne, contact precautions.  - Check daily labs: CBC w/diff, lymphopenia continues, no neutrophilia despite steroids. CRP coming down, ~4-fold reduction from peak.  - Enoxaparin prophylactic dose. D-dimer negative. - Blood cultures drawn, remain NGTD.  - Maintain euvolemia/net negative.  - Avoid NSAIDs - Continue incentive spirometry, OOB, PT/OT  Hyperlipidemia:  - Continue statin. LFTs remain wnl  Open angle glaucoma followed by Dr.  Katy Fitch, branch retinal vein occlusion OS followed by Dr. Cordelia Pen, [facility], treated with avastin in the past, currently holding antiVEGF.  - Continue home gtt's and ASA.   History of prostate CA s/p XRT.  History of SVT, HTN: No telemetry events. - Continue acebutolol, ARB  OSA on CPAP:  - Avoid CPAP if able given active covid infection. If necessary for OSA indication, can give tonight.  DVT prophylaxis: Lovenox 40mg  q24h Code Status: DNR Family Communication: None at bedside Disposition Plan: Would like ambulatory pulse oximetry/PT eval, but likely DC 1/10 after 5th remdesivir dose.  Consultants:   None  Procedures:   None  Antimicrobials:  Remdesivir 1/6 - 1/10   Subjective: No shortness of breath at rest, had SpO2 ~87% per his report last night with minimal SOB. Continues to use IS diligently. Ready for discharge after 5th dose remdesivir. Wife is fatigued but without other significant symptoms.   Objective: Vitals:   04/09/19 2100 04/10/19 0351 04/10/19 0409 04/10/19 0742  BP: (!) 125/59 136/64  (!) 149/67  Pulse: 62 (!) 59 (!) 58 60  Resp: 20 (!) 21  20  Temp: 97.8 F (36.6 C) 97.6 F (36.4 C)  97.8 F (36.6 C)  TempSrc: Oral Oral  Oral  SpO2: 93% (!) 85% 91% 90%  Weight:       Gen: 86 y.o. male in no distress Pulm: Nonlabored breathing room air at rest. Clearing crackles, very minimal at bases, no wheezes. CV: Regular rate and rhythm. No murmur, rub, or gallop. No JVD, no dependent edema. GI: Abdomen soft, non-tender, non-distended, with normoactive bowel sounds.  Ext: Warm, no deformities Skin: No  rashes, lesions or ulcers on visualized skin. Neuro: Alert and oriented. No focal neurological deficits. Psych: Judgement and insight appear fair. Mood euthymic & affect congruent. Behavior is appropriate.    Data Reviewed: I have personally reviewed following labs and imaging studies  CBC: Recent Labs  Lab 04/07/19 1039 04/08/19 0235 04/09/19 0200  04/10/19 0156  WBC 6.1 3.8* 7.7 8.3  NEUTROABS 4.5 3.0 6.6 7.4  HGB 11.7* 10.9* 10.8* 10.7*  HCT 34.3* 31.7* 31.6* 31.7*  MCV 90.5 90.1 89.5 90.1  PLT 80* 78* 145* 0000000   Basic Metabolic Panel: Recent Labs  Lab 04/07/19 1039 04/08/19 0235 04/09/19 0200 04/10/19 0156  NA 127* 131* 135 135  K 4.0 3.9 4.0 4.3  CL 96* 100 104 104  CO2 22 19* 21* 22  GLUCOSE 102* 128* 159* 161*  BUN 27* 28* 41* 45*  CREATININE 1.30* 1.01 1.05 0.95  CALCIUM 8.7* 8.3* 8.8* 8.7*  MG  --  1.9  --   --   PHOS  --  3.5  --   --    Liver Function Tests: Recent Labs  Lab 04/07/19 1039 04/08/19 0235 04/09/19 0200 04/10/19 0156  AST 25 21 22 26   ALT 20 16 24 31   ALKPHOS 53 46 50 46  BILITOT 0.7 0.9 0.2* 0.5  PROT 6.3* 5.7* 5.8* 5.6*  ALBUMIN 3.4* 3.0* 2.9* 3.0*   Anemia Panel: Recent Labs    04/07/19 1039 04/08/19 0235  FERRITIN 455* 467*   Recent Results (from the past 240 hour(s))  Blood Culture (routine x 2)     Status: None (Preliminary result)   Collection Time: 04/07/19 10:39 AM   Specimen: BLOOD  Result Value Ref Range Status   Specimen Description BLOOD LEFT ANTECUBITAL  Final   Special Requests   Final    BOTTLES DRAWN AEROBIC AND ANAEROBIC Blood Culture adequate volume   Culture   Final    NO GROWTH 3 DAYS Performed at [facility], [address], [place], [place] [address]    Report Status PENDING  Incomplete  Blood Culture (routine x 2)     Status: None (Preliminary result)   Collection Time: 04/07/19 11:57 AM   Specimen: BLOOD  Result Value Ref Range Status   Specimen Description BLOOD RIGHT ANTECUBITAL  Final   Special Requests   Final    BOTTLES DRAWN AEROBIC ONLY Blood Culture adequate volume   Culture   Final    NO GROWTH 3 DAYS Performed at [facility], [place] [address], [place], [place] [address]    Report Status PENDING  Incomplete       LOS: 3 days   Time spent: 25 minutes.  Patrecia Pour, MD [facility] [email or web address] 04/10/2019,  8:41 AM

## 2019-04-10 NOTE — Plan of Care (Signed)
  Problem: Education: Goal: Knowledge of risk factors and measures for prevention of condition will improve Outcome: Progressing   Problem: Coping: Goal: Psychosocial and spiritual needs will be supported Outcome: Progressing   Problem: Respiratory: Goal: Will maintain a patent airway Outcome: Progressing Goal: Complications related to the disease process, condition or treatment will be avoided or minimized Outcome: Progressing   

## 2019-04-11 LAB — CBC WITH DIFFERENTIAL/PLATELET
Abs Immature Granulocytes: 0.1 10*3/uL — ABNORMAL HIGH (ref 0.00–0.07)
Basophils Absolute: 0 10*3/uL (ref 0.0–0.1)
Basophils Relative: 0 %
Eosinophils Absolute: 0 10*3/uL (ref 0.0–0.5)
Eosinophils Relative: 0 %
HCT: 32.6 % — ABNORMAL LOW (ref 39.0–52.0)
Hemoglobin: 11 g/dL — ABNORMAL LOW (ref 13.0–17.0)
Immature Granulocytes: 1 %
Lymphocytes Relative: 6 %
Lymphs Abs: 0.4 10*3/uL — ABNORMAL LOW (ref 0.7–4.0)
MCH: 30.6 pg (ref 26.0–34.0)
MCHC: 33.7 g/dL (ref 30.0–36.0)
MCV: 90.6 fL (ref 80.0–100.0)
Monocytes Absolute: 0.7 10*3/uL (ref 0.1–1.0)
Monocytes Relative: 9 %
Neutro Abs: 6.3 10*3/uL (ref 1.7–7.7)
Neutrophils Relative %: 84 %
Platelets: 209 10*3/uL (ref 150–400)
RBC: 3.6 MIL/uL — ABNORMAL LOW (ref 4.22–5.81)
RDW: 13.1 % (ref 11.5–15.5)
WBC: 7.6 10*3/uL (ref 4.0–10.5)
nRBC: 0 % (ref 0.0–0.2)

## 2019-04-11 LAB — COMPREHENSIVE METABOLIC PANEL
ALT: 84 U/L — ABNORMAL HIGH (ref 0–44)
AST: 49 U/L — ABNORMAL HIGH (ref 15–41)
Albumin: 3 g/dL — ABNORMAL LOW (ref 3.5–5.0)
Alkaline Phosphatase: 49 U/L (ref 38–126)
Anion gap: 9 (ref 5–15)
BUN: 44 mg/dL — ABNORMAL HIGH (ref 8–23)
CO2: 23 mmol/L (ref 22–32)
Calcium: 8.7 mg/dL — ABNORMAL LOW (ref 8.9–10.3)
Chloride: 103 mmol/L (ref 98–111)
Creatinine, Ser: 0.95 mg/dL (ref 0.61–1.24)
GFR calc Af Amer: >60 mL/min (ref >60)
GFR calc non Af Amer: >60 mL/min (ref >60)
Glucose, Bld: 120 mg/dL — ABNORMAL HIGH (ref 70–99)
Potassium: 4.2 mmol/L (ref 3.5–5.1)
Sodium: 135 mmol/L (ref 135–145)
Total Bilirubin: 0.4 mg/dL (ref 0.3–1.2)
Total Protein: 5.7 g/dL — ABNORMAL LOW (ref 6.5–8.1)

## 2019-04-11 LAB — C-REACTIVE PROTEIN: CRP: 1.8 mg/dL — ABNORMAL HIGH (ref <1.0)

## 2019-04-11 MED ORDER — SODIUM CHLORIDE 0.9 % IV SOLN
2.0000 g | Freq: Once | INTRAVENOUS | Status: DC
  Filled 2019-04-11: qty 20

## 2019-04-11 MED ORDER — DEXAMETHASONE 6 MG PO TABS: 6.0000 mg | ORAL_TABLET | Freq: Every day | ORAL | 0 refills | Status: DC

## 2019-04-11 NOTE — TOC Transition Note (Signed)
Transition of Care [facility]) - CM/SW Discharge Note   Patient Details  Name: Jimmy Grant. MRN: [id number] Date of Birth: Oct 03, 1924  [facility] CM/SW Contact:  Geralynn Ochs, LCSW Phone Number: 04/11/2019, 4:14 PM   Clinical Narrative:   Patient discharging home today. CSW spoke with wife to set up oxygen and home health, offered choice. Oxygen set up through [employer], they will deliver to the house and wife will call when oxygen is delivered. Home health set up through [facility]. Once oxygen is delivered to the house, [facility] can be called.    Final next level of care: [place] Barriers to Discharge: Barriers Resolved   Patient Goals and CMS Choice Patient states their goals for this hospitalization and ongoing recovery are:: unable to reach patient on the phone; wife wanting patient to come home CMS Medicare.gov Compare Post Acute Care list provided to:: Patient Represenative (must comment) Choice offered to / list presented to : Spouse  Discharge Placement                Patient to be transferred to facility by: [place] Name of family member notified: Dorothy Patient and family notified of of transfer: 04/11/19  Discharge Plan and Services                DME Arranged: Oxygen DME Agency: [employer] Date DME Agency Contacted: 04/11/19 Time DME Agency Contacted: [phone] Representative spoke with at DME Agency: [place]: PT, OT [place] Agency: [facility] (formerly [employer]) Date [place]: 04/11/19 Time [place]: [place] Representative spoke with at [place]: [place] ([place]) Interventions     Readmission Risk Interventions No flowsheet data found.

## 2019-04-11 NOTE — Discharge Summary (Signed)
Physician Discharge Summary  [facility] Jimmy Grant. [id number] DOB: July 13, 1924 DOA: 04/07/2019  PCP: Jimmy Dress, MD  Admit date: 04/07/2019 Discharge date: 04/11/2019  Admitted From: Home Disposition: Home   Recommendations for Outpatient Follow-up:  1. Follow up with PCP in 1-2 weeks 2. Please obtain CMP/CBC in one week  Home Health: PT, OT Equipment/Devices: 2L O2 w/exertion Discharge Condition: Stable CODE STATUS: DNR Diet recommendation: Heart healthy  Brief/Interim Summary: Jimmy Grant. is a 86 y.o. male with a history of OSA on CPAP, prostate CA, branch retinal vein occlusion, HLD, and glaucoma who presented from [facility] to the ED 1/6 for progressive URI symptoms with cough and dyspnea that began 12/26. He had taken steroids for bronchitis before ultimately being diagnosed with covid-19. In the ED he was hypoxic requiring 4L O2 with CXR findings of peripheral bilateral opacities, SARS-CoV-2 positivity confirmed. Remdesivir, decadron, and supplemental oxygen were provided and patient admitted to [facility], since weaned to 2L O2 on exertion, otherwise no hypoxia at rest. He has completed therapy as below and is stable for discharge with home health PT and OT as well as exertional oxygen, which is expected to be weaned.  Discharge Diagnoses:  Principal Problem:   Acute respiratory disease due to COVID-19 virus Active Problems:   OSA on CPAP   History of prostate cancer   Glaucoma   Dyslipidemia   Branch retinal vein occlusion  Acute hypoxic respiratory failure due to covid-19 pneumonia, possible superimposed bacterial CAP: Felt to be at risk of progression to ARDS. Appears comfortable and on only 2L on the tail end of 2nd week since symptom onset but advanced age and inflammatory marker elevations are worrisome.  - Completed 5 days of remdesivir 1/6 - 1/10 - Continue decadron 1/6 - 1/15. CRP improving and symptoms improving - PCT noted to be 0.62, will complete  empiric CTX x5 days prior to discharge, has received azithromycin 500mg  x3 days. - Continue isolation x21 days  Hyperlipidemia:  - Continue statin. LFTs remain wnl  Open angle glaucoma followed by Dr. Katy Fitch, branch retinal vein occlusion OS followed by Dr. Cordelia Pen, [facility], treated with avastin in the past, currently holding antiVEGF.  - Continue home gtt's and ASA.   History of prostate CA s/p XRT.  History of SVT, HTN: No telemetry events. - Continue acebutolol, ARB  OSA on CPAP  Discharge Instructions Discharge Instructions    Diet - low sodium heart healthy   Complete by: As directed    Discharge instructions   Complete by: As directed    You are being discharged from the hospital after treatment for covid-19 infection. You are felt to be stable enough to no longer require inpatient monitoring, testing, and treatment, though you will need to follow the recommendations below: - Continue taking decadron for 5 more days - Remain in self-isolation for 21 days following onset of symptoms. - Do not take NSAID medications (including, but not limited to, ibuprofen, advil, motrin, naproxen, aleve, goody's powder, etc.) - Follow up with your doctor in the next week via telehealth or seek medical attention right away if your symptoms get WORSE.  - Consider donating plasma after you have recovered (either 14 days after a negative test or 28 days after symptoms have completely resolved) because your antibodies to this virus may be helpful to give to others with life-threatening infections. Please go to the website www.[email or web address] if you would like to consider volunteering for plasma donation.    Directions for you  at home:  Wear a facemask You should wear a facemask that covers your nose and mouth when you are in the same room with other people and when you visit a healthcare provider. People who live with or visit you should also wear a facemask while they are in the same room  with you.  Separate yourself from other people in your home As much as possible, you should stay in a different room from other people in your home. Also, you should use a separate bathroom, if available.  Avoid sharing household items You should not share dishes, drinking glasses, cups, eating utensils, towels, bedding, or other items with other people in your home. After using these items, you should wash them thoroughly with soap and water.  Cover your coughs and sneezes Cover your mouth and nose with a tissue when you cough or sneeze, or you can cough or sneeze into your sleeve. Throw used tissues in a lined trash can, and immediately wash your hands with soap and water for at least 20 seconds or use an alcohol-based hand rub.  Wash your [facility] your hands often and thoroughly with soap and water for at least 20 seconds. You can use an alcohol-based hand sanitizer if soap and water are not available and if your hands are not visibly dirty. Avoid touching your eyes, nose, and mouth with unwashed hands.  Directions for those who live with, or provide care at home for you:  Limit the number of people who have contact with the patient If possible, have only one caregiver for the patient. Other household members should stay in another home or place of residence. If this is not possible, they should stay in another room, or be separated from the patient as much as possible. Use a separate bathroom, if available. Restrict visitors who do not have an essential need to be in the home.  Ensure good ventilation Make sure that shared spaces in the home have good air flow, such as from an air conditioner or an opened window, weather permitting.  Wash your hands often Wash your hands often and thoroughly with soap and water for at least 20 seconds. You can use an alcohol based hand sanitizer if soap and water are not available and if your hands are not visibly dirty. Avoid touching your  eyes, nose, and mouth with unwashed hands. Use disposable paper towels to dry your hands. If not available, use dedicated cloth towels and replace them when they become wet.  Wear a facemask and gloves Wear a disposable facemask at all times in the room and gloves when you touch or have contact with the patient's blood, body fluids, and/or secretions or excretions, such as sweat, saliva, sputum, nasal mucus, vomit, urine, or feces.  Ensure the mask fits over your nose and mouth tightly, and do not touch it during use. Throw out disposable facemasks and gloves after using them. Do not reuse. Wash your hands immediately after removing your facemask and gloves. If your personal clothing becomes contaminated, carefully remove clothing and launder. Wash your hands after handling contaminated clothing. Place all used disposable facemasks, gloves, and other waste in a lined container before disposing them with other household waste. Remove gloves and wash your hands immediately after handling these items.  Do not share dishes, glasses, or other household items with the patient Avoid sharing household items. You should not share dishes, drinking glasses, cups, eating utensils, towels, bedding, or other items with a patient who is  confirmed to have, or being evaluated for, COVID-19 infection. After the person uses these items, you should wash them thoroughly with soap and water.  Wash laundry thoroughly Immediately remove and wash clothes or bedding that have blood, body fluids, and/or secretions or excretions, such as sweat, saliva, sputum, nasal mucus, vomit, urine, or feces, on them. Wear gloves when handling laundry from the patient. Read and follow directions on labels of laundry or clothing items and detergent. In general, wash and dry with the warmest temperatures recommended on the label.  Clean all areas the individual has used often Clean all touchable surfaces, such as counters, tabletops,  doorknobs, bathroom fixtures, toilets, phones, keyboards, tablets, and bedside tables, every day. Also, clean any surfaces that may have blood, body fluids, and/or secretions or excretions on them. Wear gloves when cleaning surfaces the patient has come in contact with. Use a diluted bleach solution (e.g., dilute bleach with 1 part bleach and 10 parts water) or a household disinfectant with a label that says EPA-registered for coronaviruses. To make a bleach solution at home, add 1 tablespoon of bleach to 1 quart (4 cups) of water. For a larger supply, add  cup of bleach to 1 gallon (16 cups) of water. Read labels of cleaning products and follow recommendations provided on product labels. Labels contain instructions for safe and effective use of the cleaning product including precautions you should take when applying the product, such as wearing gloves or eye protection and making sure you have good ventilation during use of the product. Remove gloves and wash hands immediately after cleaning.  Monitor yourself for signs and symptoms of illness Caregivers and household members are considered close contacts, should monitor their health, and will be asked to limit movement outside of the home to the extent possible. Follow the monitoring steps for close contacts listed on the symptom monitoring form.  If you have additional questions, contact your local health department or call the epidemiologist on call at [phone] (available 24/7). This guidance is subject to change. For the most up-to-date guidance from [facility], please refer to their website: YouBlogs.pl   Increase activity slowly   Complete by: As directed    MyChart COVID-19 home monitoring program   Complete by: Apr 11, 2019    Is the patient willing to use the [place] for home monitoring?: Yes     Allergies as of 04/11/2019      Reactions   Brimonidine Other (See  Comments)   The Generic causes inflammation in OU      Medication List    TAKE these medications   acebutolol 400 MG capsule Commonly known as: SECTRAL Take 400 mg by mouth daily. Notes to patient: Next: 1/11 AM   aspirin EC 81 MG tablet Take 81 mg by mouth daily. Notes to patient: Next: 1/11 AM   Azelastine HCl 0.15 % Soln Place 2 sprays into both nostrils daily. Notes to patient: Next: 1/11 AM   CALCIUM 1200 PO Take 1,200 mg by mouth daily. Notes to patient: Next: 1/11 AM   cholecalciferol 25 MCG (1000 UT) tablet Commonly known as: VITAMIN D3 Take 1,000 Units by mouth daily. Notes to patient: Next: 1/11 AM   dexamethasone 6 MG tablet Commonly known as: DECADRON Take 1 tablet (6 mg total) by mouth daily. Start taking on: April 12, 2019   Diovan 160 MG tablet Generic drug: valsartan Take 160 mg by mouth 2 (two) times daily. Notes to patient: Next: 1/10 PM  dorzolamide-timolol 22.3-6.8 MG/ML ophthalmic solution Commonly known as: COSOPT Place 1 drop into both eyes 2 (two) times daily. Notes to patient: Next: 1/10 PM   GLUCOSAMINE CHOND DOUBLE STR PO Take 1 tablet by mouth daily. Notes to patient: Next: 1/11 AM   ibuprofen 200 MG tablet Commonly known as: ADVIL Take 200-400 mg by mouth every 6 (six) hours as needed for fever.   PreserVision AREDS 2+Multi Vit Caps Take 1 capsule by mouth 2 (two) times daily. Notes to patient: Next: 1/10 PM   simvastatin 20 MG tablet Commonly known as: ZOCOR Take 20 mg by mouth every evening. Notes to patient: Next: 1/10 PM            Durable Medical Equipment  (From admission, onward)         Start     Ordered   04/11/19 1134  For home use only DME oxygen  Once    Question Answer Comment  Length of Need 6 Months   Liters per Minute 2   Frequency Continuous (stationary and portable oxygen unit needed)   Oxygen delivery system Gas      04/11/19 1133         Follow-up Information    Jimmy Dress,  MD. Schedule an appointment as soon as possible for a visit in 1 week(s).   Specialty: Internal Medicine Contact information: [place] [phone]          Allergies  Allergen Reactions  . Brimonidine Other (See Comments)    The Generic causes inflammation in OU    Consultations:  None  Procedures/Studies: DG Chest Port 1 View  Result Date: 04/07/2019 CLINICAL DATA:  Shortness of breath.  COVID positive EXAM: PORTABLE CHEST 1 VIEW COMPARISON:  04/03/2019 FINDINGS: New patchy streaky opacities noted at the left lung base and in the right mid lung. Findings concerning for early infiltrate/pneumonia. Heart is normal size. No effusions or acute bony abnormality. IMPRESSION: Streaky opacities at the left lung base and the right mid lung concerning for early infiltrates/pneumonia. Electronically Signed   By: Rolm Baptise M.D.   On: 04/07/2019 11:15     Subjective: Feels well, no shortness of breath at rest, very minimal on exertion. Got up to bathroom independently. No chest pain or other complaints. Ready to go home.  Discharge Exam: BP 139/72 (BP Location: Right Arm)   Pulse (!) 59   Temp (!) 97.5 F (36.4 C) (Oral)   Resp 16   Wt 86.2 kg   SpO2 90% Comment: w/ ambulation in hall  General: Pt is alert, awake, not in acute distress Cardiovascular: RRR, S1/S2 +, no rubs, no gallops Respiratory: CTA bilaterally, no wheezing, no rhonchi Abdominal: Soft, NT, ND, bowel sounds + Extremities: No edema, no cyanosis  Labs: BNP (last 3 results) No results for input(s): BNP in the last 8760 hours. Basic Metabolic Panel: Recent Labs  Lab 04/07/19 1039 04/08/19 0235 04/09/19 0200 04/10/19 0156 04/11/19 0700  NA 127* 131* 135 135 135  K 4.0 3.9 4.0 4.3 4.2  CL 96* 100 104 104 103  CO2 22 19* 21* 22 23  GLUCOSE 102* 128* 159* 161* 120*  BUN 27* 28* 41* 45* 44*  CREATININE 1.30* 1.01 1.05 0.95 0.95  CALCIUM 8.7* 8.3* 8.8* 8.7*  8.7*  MG  --  1.9  --   --   --   PHOS  --  3.5  --   --   --  Liver Function Tests: Recent Labs  Lab 04/07/19 1039 04/08/19 0235 04/09/19 0200 04/10/19 0156 04/11/19 0700  AST 25 21 22 26  49*  ALT 20 16 24 31  84*  ALKPHOS 53 46 50 46 49  BILITOT 0.7 0.9 0.2* 0.5 0.4  PROT 6.3* 5.7* 5.8* 5.6* 5.7*  ALBUMIN 3.4* 3.0* 2.9* 3.0* 3.0*   No results for input(s): LIPASE, AMYLASE in the last 168 hours. No results for input(s): AMMONIA in the last 168 hours. CBC: Recent Labs  Lab 04/07/19 1039 04/08/19 0235 04/09/19 0200 04/10/19 0156 04/11/19 0700  WBC 6.1 3.8* 7.7 8.3 7.6  NEUTROABS 4.5 3.0 6.6 7.4 6.3  HGB 11.7* 10.9* 10.8* 10.7* 11.0*  HCT 34.3* 31.7* 31.6* 31.7* 32.6*  MCV 90.5 90.1 89.5 90.1 90.6  PLT 80* 78* 145* 178 209   Cardiac Enzymes: No results for input(s): CKTOTAL, CKMB, CKMBINDEX, TROPONINI in the last 168 hours. BNP: Invalid input(s): POCBNP CBG: No results for input(s): GLUCAP in the last 168 hours. D-Dimer Recent Labs    04/09/19 0200  DDIMER 0.40   Hgb A1c No results for input(s): HGBA1C in the last 72 hours. Lipid Profile No results for input(s): CHOL, HDL, LDLCALC, TRIG, CHOLHDL, LDLDIRECT in the last 72 hours. Thyroid function studies No results for input(s): TSH, T4TOTAL, T3FREE, THYROIDAB in the last 72 hours.  Invalid input(s): FREET3 Anemia work up No results for input(s): VITAMINB12, FOLATE, FERRITIN, TIBC, IRON, RETICCTPCT in the last 72 hours. Urinalysis No results found for: COLORURINE, APPEARANCEUR, [place], [place], [place], [place], [place], [place], [place], [place], [place], [place]  Microbiology Recent Results (from the past 240 hour(s))  Blood Culture (routine x 2)     Status: None (Preliminary result)   Collection Time: 04/07/19 10:39 AM   Specimen: BLOOD  Result Value Ref Range Status   Specimen Description BLOOD LEFT ANTECUBITAL  Final   Special Requests   Final    BOTTLES DRAWN AEROBIC AND  ANAEROBIC Blood Culture adequate volume   Culture   Final    NO GROWTH 3 DAYS Performed at [facility], [address] [place], [place] [address]    Report Status PENDING  Incomplete  Blood Culture (routine x 2)     Status: None (Preliminary result)   Collection Time: 04/07/19 11:57 AM   Specimen: BLOOD  Result Value Ref Range Status   Specimen Description BLOOD RIGHT ANTECUBITAL  Final   Special Requests   Final    BOTTLES DRAWN AEROBIC ONLY Blood Culture adequate volume   Culture   Final    NO GROWTH 3 DAYS Performed at [facility], [place] [address] [place],  [address]    Report Status PENDING  Incomplete    Time coordinating discharge: Approximately 40 minutes  Patrecia Pour, MD  [facility] 04/11/2019, 11:37 AM

## 2019-04-11 NOTE — Discharge Planning (Signed)
Patient 92% SPO2 on RA (at rest) Ambulating in [place] RA, patient SPO2 fell t0 85% Ambulating in [place] on [facility] could maintain 90% SPO2. Patient will need O2 at home for activity.

## 2019-04-11 NOTE — Discharge Instructions (Signed)
COVID-19 COVID-19 is a respiratory infection that is caused by a virus called severe acute respiratory syndrome coronavirus 2 (SARS-CoV-2). The disease is also known as coronavirus disease or novel coronavirus. In some people, the virus may not cause any symptoms. In others, it may cause a serious infection. The infection can get worse quickly and can lead to complications, such as:  Pneumonia, or infection of the lungs.  Acute respiratory distress syndrome or ARDS. This is a condition in which fluid build-up in the lungs prevents the lungs from filling with air and passing oxygen into the blood.  Acute respiratory failure. This is a condition in which there is not enough oxygen passing from the lungs to the body or when carbon dioxide is not passing from the lungs out of the body.  Sepsis or septic shock. This is a serious bodily reaction to an infection.  Blood clotting problems.  Secondary infections due to bacteria or fungus.  Organ failure. This is when your body's organs stop working. The virus that causes COVID-19 is contagious. This means that it can spread from person to person through droplets from coughs and sneezes (respiratory secretions). What are the causes? This illness is caused by a virus. You may catch the virus by:  Breathing in droplets from an infected person. Droplets can be spread by a person breathing, speaking, singing, coughing, or sneezing.  Touching something, like a table or a doorknob, that was exposed to the virus (contaminated) and then touching your mouth, nose, or eyes. What increases the risk? Risk for infection You are more likely to be infected with this virus if you:  Are within 6 feet (2 meters) of a person with COVID-19.  Provide care for or live with a person who is infected with COVID-19.  Spend time in crowded indoor spaces or live in shared housing. Risk for serious illness You are more likely to become seriously ill from the virus if you:   Are 50 years of age or older. The higher your age, the more you are at risk for serious illness.  Live in a nursing home or long-term care facility.  Have cancer.  Have a long-term (chronic) disease such as: ? Chronic lung disease, including chronic obstructive pulmonary disease or asthma. ? A long-term disease that lowers your body's ability to fight infection (immunocompromised). ? Heart disease, including heart failure, a condition in which the arteries that lead to the heart become narrow or blocked (coronary artery disease), a disease which makes the heart muscle thick, weak, or stiff (cardiomyopathy). ? Diabetes. ? Chronic kidney disease. ? Sickle cell disease, a condition in which red blood cells have an abnormal "sickle" shape. ? Liver disease.  Are obese. What are the signs or symptoms? Symptoms of this condition can range from mild to severe. Symptoms may appear any time from 2 to 14 days after being exposed to the virus. They include:  A fever or chills.  A cough.  Difficulty breathing.  Headaches, body aches, or muscle aches.  Runny or stuffy (congested) nose.  A sore throat.  New loss of taste or smell. Some people may also have stomach problems, such as nausea, vomiting, or diarrhea. Other people may not have any symptoms of COVID-19. How is this diagnosed? This condition may be diagnosed based on:  Your signs and symptoms, especially if: ? You live in an area with a COVID-19 outbreak. ? You recently traveled to or from an area where the virus is common. ? You   provide care for or live with a person who was diagnosed with COVID-19. ? You were exposed to a person who was diagnosed with COVID-19.  A physical exam.  Lab tests, which may include: ? Taking a sample of fluid from the back of your nose and throat (nasopharyngeal fluid), your nose, or your throat using a swab. ? A sample of mucus from your lungs (sputum). ? Blood tests.  Imaging tests, which  may include, X-rays, CT scan, or ultrasound. How is this treated? At present, there is no medicine to treat COVID-19. Medicines that treat other diseases are being used on a trial basis to see if they are effective against COVID-19. Your health care provider will talk with you about ways to treat your symptoms. For most people, the infection is mild and can be managed at home with rest, fluids, and over-the-counter medicines. Treatment for a serious infection usually takes places in a hospital intensive care unit (ICU). It may include one or more of the following treatments. These treatments are given until your symptoms improve.  Receiving fluids and medicines through an IV.  Supplemental oxygen. Extra oxygen is given through a tube in the nose, a face mask, or a hood.  Positioning you to lie on your stomach (prone position). This makes it easier for oxygen to get into the lungs.  Continuous positive airway pressure (CPAP) or bi-level positive airway pressure (BPAP) machine. This treatment uses mild air pressure to keep the airways open. A tube that is connected to a motor delivers oxygen to the body.  Ventilator. This treatment moves air into and out of the lungs by using a tube that is placed in your windpipe.  Tracheostomy. This is a procedure to create a hole in the neck so that a breathing tube can be inserted.  Extracorporeal membrane oxygenation (ECMO). This procedure gives the lungs a chance to recover by taking over the functions of the heart and lungs. It supplies oxygen to the body and removes carbon dioxide. Follow these instructions at home: Lifestyle  If you are sick, stay home except to get medical care. Your health care provider will tell you how long to stay home. Call your health care provider before you go for medical care.  Rest at home as told by your health care provider.  Do not use any products that contain nicotine or tobacco, such as cigarettes, e-cigarettes, and  chewing tobacco. If you need help quitting, ask your health care provider.  Return to your normal activities as told by your health care provider. Ask your health care provider what activities are safe for you. General instructions  Take over-the-counter and prescription medicines only as told by your health care provider.  Drink enough fluid to keep your urine pale yellow.  Keep all follow-up visits as told by your health care provider. This is important. How is this prevented?  There is no vaccine to help prevent COVID-19 infection. However, there are steps you can take to protect yourself and others from this virus. To protect yourself:   Do not travel to areas where COVID-19 is a risk. The areas where COVID-19 is reported change often. To identify high-risk areas and travel restrictions, check the CDC travel website: wwwnc.cdc.gov/travel/notices  If you live in, or must travel to, an area where COVID-19 is a risk, take precautions to avoid infection. ? Stay away from people who are sick. ? Wash your hands often with soap and water for 20 seconds. If soap and water   are not available, use an alcohol-based hand sanitizer. ? Avoid touching your mouth, face, eyes, or nose. ? Avoid going out in public, follow guidance from your state and local health authorities. ? If you must go out in public, wear a cloth face covering or face mask. Make sure your mask covers your nose and mouth. ? Avoid crowded indoor spaces. Stay at least 6 feet (2 meters) away from others. ? Disinfect objects and surfaces that are frequently touched every day. This may include:  Counters and tables.  Doorknobs and light switches.  Sinks and faucets.  Electronics, such as phones, remote controls, keyboards, computers, and tablets. To protect others: If you have symptoms of COVID-19, take steps to prevent the virus from spreading to others.  If you think you have a COVID-19 infection, contact your health care  provider right away. Tell your health care team that you think you may have a COVID-19 infection.  Stay home. Leave your house only to seek medical care. Do not use public transport.  Do not travel while you are sick.  Wash your hands often with soap and water for 20 seconds. If soap and water are not available, use alcohol-based hand sanitizer.  Stay away from other members of your household. Let healthy household members care for children and pets, if possible. If you have to care for children or pets, wash your hands often and wear a mask. If possible, stay in your own room, separate from others. Use a different bathroom.  Make sure that all people in your household wash their hands well and often.  Cough or sneeze into a tissue or your sleeve or elbow. Do not cough or sneeze into your hand or into the air.  Wear a cloth face covering or face mask. Make sure your mask covers your nose and mouth. Where to find more information  Centers for Disease Control and Prevention: www.cdc.gov/coronavirus/2019-ncov/index.html  World Health Organization: www.who.int/health-topics/coronavirus Contact a health care provider if:  You live in or have traveled to an area where COVID-19 is a risk and you have symptoms of the infection.  You have had contact with someone who has COVID-19 and you have symptoms of the infection. Get help right away if:  You have trouble breathing.  You have pain or pressure in your chest.  You have confusion.  You have bluish lips and fingernails.  You have difficulty waking from sleep.  You have symptoms that get worse. These symptoms may represent a serious problem that is an emergency. Do not wait to see if the symptoms will go away. Get medical help right away. Call your local emergency services (911 in the [place] Do not drive yourself to the hospital. Let the emergency medical personnel know if you think you have COVID-19. Summary  COVID-19 is a  respiratory infection that is caused by a virus. It is also known as coronavirus disease or novel coronavirus. It can cause serious infections, such as pneumonia, acute respiratory distress syndrome, acute respiratory failure, or sepsis.  The virus that causes COVID-19 is contagious. This means that it can spread from person to person through droplets from breathing, speaking, singing, coughing, or sneezing.  You are more likely to develop a serious illness if you are 50 years of age or older, have a weak immune system, live in a nursing home, or have chronic disease.  There is no medicine to treat COVID-19. Your health care provider will talk with you about ways to treat your symptoms.    Take steps to protect yourself and others from infection. Wash your hands often and disinfect objects and surfaces that are frequently touched every day. Stay away from people who are sick and wear a mask if you are sick. This information is not intended to replace advice given to you by your health care provider. Make sure you discuss any questions you have with your health care provider. Document Revised: 01/15/2019 Document Reviewed: 04/23/2018 Elsevier Patient Education  2020 [employer]  

## 2019-04-12 DIAGNOSIS — U071 COVID-19: Secondary | ICD-10-CM | POA: Diagnosis not present

## 2019-04-12 LAB — CULTURE, BLOOD (ROUTINE X 2)
Culture: NO GROWTH
Culture: NO GROWTH
Special Requests: ADEQUATE
Special Requests: ADEQUATE

## 2019-04-14 DIAGNOSIS — U071 COVID-19: Secondary | ICD-10-CM | POA: Diagnosis not present

## 2019-04-14 DIAGNOSIS — J80 Acute respiratory distress syndrome: Secondary | ICD-10-CM | POA: Diagnosis not present

## 2019-04-20 DIAGNOSIS — G4733 Obstructive sleep apnea (adult) (pediatric): Secondary | ICD-10-CM | POA: Diagnosis not present

## 2019-04-20 DIAGNOSIS — Z9981 Dependence on supplemental oxygen: Secondary | ICD-10-CM | POA: Diagnosis not present

## 2019-04-20 DIAGNOSIS — Z87891 Personal history of nicotine dependence: Secondary | ICD-10-CM | POA: Diagnosis not present

## 2019-04-20 DIAGNOSIS — E785 Hyperlipidemia, unspecified: Secondary | ICD-10-CM | POA: Diagnosis not present

## 2019-04-20 DIAGNOSIS — J1282 Pneumonia due to coronavirus disease 2019: Secondary | ICD-10-CM | POA: Diagnosis not present

## 2019-04-20 DIAGNOSIS — J9601 Acute respiratory failure with hypoxia: Secondary | ICD-10-CM | POA: Diagnosis not present

## 2019-04-20 DIAGNOSIS — H4010X0 Unspecified open-angle glaucoma, stage unspecified: Secondary | ICD-10-CM | POA: Diagnosis not present

## 2019-04-20 DIAGNOSIS — U071 COVID-19: Secondary | ICD-10-CM | POA: Diagnosis not present

## 2019-04-20 DIAGNOSIS — J40 Bronchitis, not specified as acute or chronic: Secondary | ICD-10-CM | POA: Diagnosis not present

## 2019-04-20 DIAGNOSIS — H348392 Tributary (branch) retinal vein occlusion, unspecified eye, stable: Secondary | ICD-10-CM | POA: Diagnosis not present

## 2019-04-20 DIAGNOSIS — I10 Essential (primary) hypertension: Secondary | ICD-10-CM | POA: Diagnosis not present

## 2019-04-23 DIAGNOSIS — G4733 Obstructive sleep apnea (adult) (pediatric): Secondary | ICD-10-CM | POA: Diagnosis not present

## 2019-04-23 DIAGNOSIS — J1282 Pneumonia due to coronavirus disease 2019: Secondary | ICD-10-CM | POA: Diagnosis not present

## 2019-04-23 DIAGNOSIS — E785 Hyperlipidemia, unspecified: Secondary | ICD-10-CM | POA: Diagnosis not present

## 2019-04-23 DIAGNOSIS — J9601 Acute respiratory failure with hypoxia: Secondary | ICD-10-CM | POA: Diagnosis not present

## 2019-04-23 DIAGNOSIS — U071 COVID-19: Secondary | ICD-10-CM | POA: Diagnosis not present

## 2019-04-23 DIAGNOSIS — H4010X0 Unspecified open-angle glaucoma, stage unspecified: Secondary | ICD-10-CM | POA: Diagnosis not present

## 2019-04-23 DIAGNOSIS — H348392 Tributary (branch) retinal vein occlusion, unspecified eye, stable: Secondary | ICD-10-CM | POA: Diagnosis not present

## 2019-04-23 DIAGNOSIS — J40 Bronchitis, not specified as acute or chronic: Secondary | ICD-10-CM | POA: Diagnosis not present

## 2019-04-23 DIAGNOSIS — I10 Essential (primary) hypertension: Secondary | ICD-10-CM | POA: Diagnosis not present

## 2019-04-23 DIAGNOSIS — Z9981 Dependence on supplemental oxygen: Secondary | ICD-10-CM | POA: Diagnosis not present

## 2019-04-23 DIAGNOSIS — Z87891 Personal history of nicotine dependence: Secondary | ICD-10-CM | POA: Diagnosis not present

## 2019-04-27 DIAGNOSIS — Z9981 Dependence on supplemental oxygen: Secondary | ICD-10-CM | POA: Diagnosis not present

## 2019-04-27 DIAGNOSIS — H4010X0 Unspecified open-angle glaucoma, stage unspecified: Secondary | ICD-10-CM | POA: Diagnosis not present

## 2019-04-27 DIAGNOSIS — J9601 Acute respiratory failure with hypoxia: Secondary | ICD-10-CM | POA: Diagnosis not present

## 2019-04-27 DIAGNOSIS — G4733 Obstructive sleep apnea (adult) (pediatric): Secondary | ICD-10-CM | POA: Diagnosis not present

## 2019-04-27 DIAGNOSIS — U071 COVID-19: Secondary | ICD-10-CM | POA: Diagnosis not present

## 2019-04-27 DIAGNOSIS — Z87891 Personal history of nicotine dependence: Secondary | ICD-10-CM | POA: Diagnosis not present

## 2019-04-27 DIAGNOSIS — J40 Bronchitis, not specified as acute or chronic: Secondary | ICD-10-CM | POA: Diagnosis not present

## 2019-04-27 DIAGNOSIS — H348392 Tributary (branch) retinal vein occlusion, unspecified eye, stable: Secondary | ICD-10-CM | POA: Diagnosis not present

## 2019-04-27 DIAGNOSIS — E785 Hyperlipidemia, unspecified: Secondary | ICD-10-CM | POA: Diagnosis not present

## 2019-04-27 DIAGNOSIS — J1282 Pneumonia due to coronavirus disease 2019: Secondary | ICD-10-CM | POA: Diagnosis not present

## 2019-04-27 DIAGNOSIS — I10 Essential (primary) hypertension: Secondary | ICD-10-CM | POA: Diagnosis not present

## 2019-04-28 ENCOUNTER — Other Ambulatory Visit: Payer: Self-pay

## 2019-04-29 DIAGNOSIS — H4010X0 Unspecified open-angle glaucoma, stage unspecified: Secondary | ICD-10-CM | POA: Diagnosis not present

## 2019-04-29 DIAGNOSIS — I10 Essential (primary) hypertension: Secondary | ICD-10-CM | POA: Diagnosis not present

## 2019-04-29 DIAGNOSIS — J1282 Pneumonia due to coronavirus disease 2019: Secondary | ICD-10-CM | POA: Diagnosis not present

## 2019-04-29 DIAGNOSIS — E785 Hyperlipidemia, unspecified: Secondary | ICD-10-CM | POA: Diagnosis not present

## 2019-04-29 DIAGNOSIS — Z87891 Personal history of nicotine dependence: Secondary | ICD-10-CM | POA: Diagnosis not present

## 2019-04-29 DIAGNOSIS — H348392 Tributary (branch) retinal vein occlusion, unspecified eye, stable: Secondary | ICD-10-CM | POA: Diagnosis not present

## 2019-04-29 DIAGNOSIS — J40 Bronchitis, not specified as acute or chronic: Secondary | ICD-10-CM | POA: Diagnosis not present

## 2019-04-29 DIAGNOSIS — U071 COVID-19: Secondary | ICD-10-CM | POA: Diagnosis not present

## 2019-04-29 DIAGNOSIS — J9601 Acute respiratory failure with hypoxia: Secondary | ICD-10-CM | POA: Diagnosis not present

## 2019-04-29 DIAGNOSIS — Z9981 Dependence on supplemental oxygen: Secondary | ICD-10-CM | POA: Diagnosis not present

## 2019-04-29 DIAGNOSIS — G4733 Obstructive sleep apnea (adult) (pediatric): Secondary | ICD-10-CM | POA: Diagnosis not present

## 2019-05-03 DIAGNOSIS — E785 Hyperlipidemia, unspecified: Secondary | ICD-10-CM | POA: Diagnosis not present

## 2019-05-03 DIAGNOSIS — Z9981 Dependence on supplemental oxygen: Secondary | ICD-10-CM | POA: Diagnosis not present

## 2019-05-03 DIAGNOSIS — Z87891 Personal history of nicotine dependence: Secondary | ICD-10-CM | POA: Diagnosis not present

## 2019-05-03 DIAGNOSIS — H4010X0 Unspecified open-angle glaucoma, stage unspecified: Secondary | ICD-10-CM | POA: Diagnosis not present

## 2019-05-03 DIAGNOSIS — H348392 Tributary (branch) retinal vein occlusion, unspecified eye, stable: Secondary | ICD-10-CM | POA: Diagnosis not present

## 2019-05-03 DIAGNOSIS — U071 COVID-19: Secondary | ICD-10-CM | POA: Diagnosis not present

## 2019-05-03 DIAGNOSIS — G4733 Obstructive sleep apnea (adult) (pediatric): Secondary | ICD-10-CM | POA: Diagnosis not present

## 2019-05-03 DIAGNOSIS — J1282 Pneumonia due to coronavirus disease 2019: Secondary | ICD-10-CM | POA: Diagnosis not present

## 2019-05-03 DIAGNOSIS — J9601 Acute respiratory failure with hypoxia: Secondary | ICD-10-CM | POA: Diagnosis not present

## 2019-05-03 DIAGNOSIS — I10 Essential (primary) hypertension: Secondary | ICD-10-CM | POA: Diagnosis not present

## 2019-05-03 DIAGNOSIS — J40 Bronchitis, not specified as acute or chronic: Secondary | ICD-10-CM | POA: Diagnosis not present

## 2019-05-05 DIAGNOSIS — J40 Bronchitis, not specified as acute or chronic: Secondary | ICD-10-CM | POA: Diagnosis not present

## 2019-05-05 DIAGNOSIS — J1282 Pneumonia due to coronavirus disease 2019: Secondary | ICD-10-CM | POA: Diagnosis not present

## 2019-05-05 DIAGNOSIS — Z9981 Dependence on supplemental oxygen: Secondary | ICD-10-CM | POA: Diagnosis not present

## 2019-05-05 DIAGNOSIS — J9601 Acute respiratory failure with hypoxia: Secondary | ICD-10-CM | POA: Diagnosis not present

## 2019-05-05 DIAGNOSIS — Z87891 Personal history of nicotine dependence: Secondary | ICD-10-CM | POA: Diagnosis not present

## 2019-05-05 DIAGNOSIS — H348392 Tributary (branch) retinal vein occlusion, unspecified eye, stable: Secondary | ICD-10-CM | POA: Diagnosis not present

## 2019-05-05 DIAGNOSIS — I10 Essential (primary) hypertension: Secondary | ICD-10-CM | POA: Diagnosis not present

## 2019-05-05 DIAGNOSIS — H4010X0 Unspecified open-angle glaucoma, stage unspecified: Secondary | ICD-10-CM | POA: Diagnosis not present

## 2019-05-05 DIAGNOSIS — G4733 Obstructive sleep apnea (adult) (pediatric): Secondary | ICD-10-CM | POA: Diagnosis not present

## 2019-05-05 DIAGNOSIS — U071 COVID-19: Secondary | ICD-10-CM | POA: Diagnosis not present

## 2019-05-05 DIAGNOSIS — E785 Hyperlipidemia, unspecified: Secondary | ICD-10-CM | POA: Diagnosis not present

## 2019-05-10 DIAGNOSIS — U071 COVID-19: Secondary | ICD-10-CM | POA: Diagnosis not present

## 2019-05-10 DIAGNOSIS — Z9981 Dependence on supplemental oxygen: Secondary | ICD-10-CM | POA: Diagnosis not present

## 2019-05-10 DIAGNOSIS — H348392 Tributary (branch) retinal vein occlusion, unspecified eye, stable: Secondary | ICD-10-CM | POA: Diagnosis not present

## 2019-05-10 DIAGNOSIS — Z87891 Personal history of nicotine dependence: Secondary | ICD-10-CM | POA: Diagnosis not present

## 2019-05-10 DIAGNOSIS — G4733 Obstructive sleep apnea (adult) (pediatric): Secondary | ICD-10-CM | POA: Diagnosis not present

## 2019-05-10 DIAGNOSIS — J1282 Pneumonia due to coronavirus disease 2019: Secondary | ICD-10-CM | POA: Diagnosis not present

## 2019-05-10 DIAGNOSIS — J9601 Acute respiratory failure with hypoxia: Secondary | ICD-10-CM | POA: Diagnosis not present

## 2019-05-10 DIAGNOSIS — I10 Essential (primary) hypertension: Secondary | ICD-10-CM | POA: Diagnosis not present

## 2019-05-10 DIAGNOSIS — H4010X0 Unspecified open-angle glaucoma, stage unspecified: Secondary | ICD-10-CM | POA: Diagnosis not present

## 2019-05-10 DIAGNOSIS — E785 Hyperlipidemia, unspecified: Secondary | ICD-10-CM | POA: Diagnosis not present

## 2019-05-10 DIAGNOSIS — J40 Bronchitis, not specified as acute or chronic: Secondary | ICD-10-CM | POA: Diagnosis not present

## 2019-05-12 DIAGNOSIS — J40 Bronchitis, not specified as acute or chronic: Secondary | ICD-10-CM | POA: Diagnosis not present

## 2019-05-12 DIAGNOSIS — Z87891 Personal history of nicotine dependence: Secondary | ICD-10-CM | POA: Diagnosis not present

## 2019-05-12 DIAGNOSIS — J1282 Pneumonia due to coronavirus disease 2019: Secondary | ICD-10-CM | POA: Diagnosis not present

## 2019-05-12 DIAGNOSIS — U071 COVID-19: Secondary | ICD-10-CM | POA: Diagnosis not present

## 2019-05-12 DIAGNOSIS — Z9981 Dependence on supplemental oxygen: Secondary | ICD-10-CM | POA: Diagnosis not present

## 2019-05-12 DIAGNOSIS — E785 Hyperlipidemia, unspecified: Secondary | ICD-10-CM | POA: Diagnosis not present

## 2019-05-12 DIAGNOSIS — I10 Essential (primary) hypertension: Secondary | ICD-10-CM | POA: Diagnosis not present

## 2019-05-12 DIAGNOSIS — G4733 Obstructive sleep apnea (adult) (pediatric): Secondary | ICD-10-CM | POA: Diagnosis not present

## 2019-05-12 DIAGNOSIS — J9601 Acute respiratory failure with hypoxia: Secondary | ICD-10-CM | POA: Diagnosis not present

## 2019-05-12 DIAGNOSIS — H348392 Tributary (branch) retinal vein occlusion, unspecified eye, stable: Secondary | ICD-10-CM | POA: Diagnosis not present

## 2019-05-12 DIAGNOSIS — H4010X0 Unspecified open-angle glaucoma, stage unspecified: Secondary | ICD-10-CM | POA: Diagnosis not present

## 2019-05-17 DIAGNOSIS — U071 COVID-19: Secondary | ICD-10-CM | POA: Diagnosis not present

## 2019-05-17 DIAGNOSIS — G4733 Obstructive sleep apnea (adult) (pediatric): Secondary | ICD-10-CM | POA: Diagnosis not present

## 2019-05-17 DIAGNOSIS — J9601 Acute respiratory failure with hypoxia: Secondary | ICD-10-CM | POA: Diagnosis not present

## 2019-05-17 DIAGNOSIS — H4010X0 Unspecified open-angle glaucoma, stage unspecified: Secondary | ICD-10-CM | POA: Diagnosis not present

## 2019-05-17 DIAGNOSIS — Z87891 Personal history of nicotine dependence: Secondary | ICD-10-CM | POA: Diagnosis not present

## 2019-05-17 DIAGNOSIS — J40 Bronchitis, not specified as acute or chronic: Secondary | ICD-10-CM | POA: Diagnosis not present

## 2019-05-17 DIAGNOSIS — J1282 Pneumonia due to coronavirus disease 2019: Secondary | ICD-10-CM | POA: Diagnosis not present

## 2019-05-17 DIAGNOSIS — E785 Hyperlipidemia, unspecified: Secondary | ICD-10-CM | POA: Diagnosis not present

## 2019-05-17 DIAGNOSIS — I10 Essential (primary) hypertension: Secondary | ICD-10-CM | POA: Diagnosis not present

## 2019-05-17 DIAGNOSIS — Z9981 Dependence on supplemental oxygen: Secondary | ICD-10-CM | POA: Diagnosis not present

## 2019-05-17 DIAGNOSIS — H348392 Tributary (branch) retinal vein occlusion, unspecified eye, stable: Secondary | ICD-10-CM | POA: Diagnosis not present

## 2019-05-24 DIAGNOSIS — H4010X0 Unspecified open-angle glaucoma, stage unspecified: Secondary | ICD-10-CM | POA: Diagnosis not present

## 2019-05-24 DIAGNOSIS — Z9981 Dependence on supplemental oxygen: Secondary | ICD-10-CM | POA: Diagnosis not present

## 2019-05-24 DIAGNOSIS — G4733 Obstructive sleep apnea (adult) (pediatric): Secondary | ICD-10-CM | POA: Diagnosis not present

## 2019-05-24 DIAGNOSIS — Z87891 Personal history of nicotine dependence: Secondary | ICD-10-CM | POA: Diagnosis not present

## 2019-05-24 DIAGNOSIS — J1282 Pneumonia due to coronavirus disease 2019: Secondary | ICD-10-CM | POA: Diagnosis not present

## 2019-05-24 DIAGNOSIS — U071 COVID-19: Secondary | ICD-10-CM | POA: Diagnosis not present

## 2019-05-24 DIAGNOSIS — I10 Essential (primary) hypertension: Secondary | ICD-10-CM | POA: Diagnosis not present

## 2019-05-24 DIAGNOSIS — J40 Bronchitis, not specified as acute or chronic: Secondary | ICD-10-CM | POA: Diagnosis not present

## 2019-05-24 DIAGNOSIS — E785 Hyperlipidemia, unspecified: Secondary | ICD-10-CM | POA: Diagnosis not present

## 2019-05-24 DIAGNOSIS — J9601 Acute respiratory failure with hypoxia: Secondary | ICD-10-CM | POA: Diagnosis not present

## 2019-05-24 DIAGNOSIS — H348392 Tributary (branch) retinal vein occlusion, unspecified eye, stable: Secondary | ICD-10-CM | POA: Diagnosis not present

## 2019-05-26 DIAGNOSIS — H353121 Nonexudative age-related macular degeneration, left eye, early dry stage: Secondary | ICD-10-CM | POA: Diagnosis not present

## 2019-05-26 DIAGNOSIS — Z961 Presence of intraocular lens: Secondary | ICD-10-CM | POA: Diagnosis not present

## 2019-05-26 DIAGNOSIS — D3131 Benign neoplasm of right choroid: Secondary | ICD-10-CM | POA: Diagnosis not present

## 2019-05-26 DIAGNOSIS — H401133 Primary open-angle glaucoma, bilateral, severe stage: Secondary | ICD-10-CM | POA: Diagnosis not present

## 2019-05-31 DIAGNOSIS — I10 Essential (primary) hypertension: Secondary | ICD-10-CM | POA: Diagnosis not present

## 2019-05-31 DIAGNOSIS — I471 Supraventricular tachycardia: Secondary | ICD-10-CM | POA: Diagnosis not present

## 2019-05-31 DIAGNOSIS — E785 Hyperlipidemia, unspecified: Secondary | ICD-10-CM | POA: Diagnosis not present

## 2019-05-31 DIAGNOSIS — E538 Deficiency of other specified B group vitamins: Secondary | ICD-10-CM | POA: Diagnosis not present

## 2019-05-31 DIAGNOSIS — D638 Anemia in other chronic diseases classified elsewhere: Secondary | ICD-10-CM | POA: Diagnosis not present

## 2019-05-31 DIAGNOSIS — I6990 Unspecified sequelae of unspecified cerebrovascular disease: Secondary | ICD-10-CM | POA: Diagnosis not present

## 2019-05-31 DIAGNOSIS — R600 Localized edema: Secondary | ICD-10-CM | POA: Diagnosis not present

## 2019-06-09 DIAGNOSIS — U071 COVID-19: Secondary | ICD-10-CM | POA: Diagnosis not present

## 2019-06-10 DIAGNOSIS — N3020 Other chronic cystitis without hematuria: Secondary | ICD-10-CM | POA: Diagnosis not present

## 2019-07-26 ENCOUNTER — Ambulatory Visit (INDEPENDENT_AMBULATORY_CARE_PROVIDER_SITE_OTHER): Payer: Medicare Other | Admitting: Pulmonary Disease

## 2019-07-26 ENCOUNTER — Other Ambulatory Visit: Payer: Self-pay

## 2019-07-26 ENCOUNTER — Encounter: Payer: Self-pay | Admitting: Pulmonary Disease

## 2019-07-26 VITALS — BP 136/64 | HR 74 | Temp 97.2°F | Ht 72.0 in | Wt 190.2 lb

## 2019-07-26 DIAGNOSIS — G4733 Obstructive sleep apnea (adult) (pediatric): Secondary | ICD-10-CM

## 2019-07-26 NOTE — Patient Instructions (Signed)
Obstructive sleep apnea  Your AHI is still elevated  We will change her pressure from 9-10  We will get a download from your machine in about a month  Call if this pressure changes not well-tolerated  The goal is still to have you feeling like you have had a good nights rest every night-not to chase the numbers  I will follow up with you tentatively in 6 months Welcome to call if you have any concerns

## 2019-07-26 NOTE — Progress Notes (Addendum)
[address] Jimmy Grant    CE:[id number]    01/23/1925  Primary Care Physician:Schultz, Lora Havens, MD  Referring Physician: Nicoletta Dress, MD [address] [place],  [place] [address]  Chief complaint:   Patient with a history of mild obstructive sleep apnea for which he has been compliant with CPAP use  HPI:  Has used CPAP since 2017 Diagnosed with mild obstructive sleep apnea Titrated to a pressure of 9 in the lab At some point he was on a pressure of 12 but was brought down to 9 secondary to mask leaks  Usually goes to bed between 9 and 10 PM He is a side sleeper Usually takes him about 5 minutes to an hour to fall asleep About 4 awakenings Final wake up time about 6 AM  He feels his sleep is restorative He feels he wakes up multiple times at night because of being on CPAP He does not wake up to use the bathroom He does have musculoskeletal pains and discomfort but does not feel this is contributing to his awakenings  He started on CPAP secondary to having tachycardia when he wakes up from sleep, this is controlled at present  He does keep a very good log about his machine  Currently uses a full facemask  Outpatient Encounter Medications as of 07/26/2019  Medication Sig  . acebutolol (SECTRAL) 400 MG capsule Take 400 mg by mouth daily.  Marland Kitchen aspirin EC 81 MG tablet Take 81 mg by mouth daily.  . Azelastine HCl 0.15 % SOLN Place 2 sprays into both nostrils daily.  . Calcium Carbonate-Vit D-Min (CALCIUM 1200 PO) Take 1,200 mg by mouth daily.  . cholecalciferol (VITAMIN D3) 25 MCG (1000 UT) tablet Take 1,000 Units by mouth daily.  Marland Kitchen dexamethasone (DECADRON) 6 MG tablet Take 1 tablet (6 mg total) by mouth daily.  Marland Kitchen DIOVAN 160 MG tablet Take 160 mg by mouth 2 (two) times daily.   . dorzolamide-timolol (COSOPT) 22.3-6.8 MG/ML ophthalmic solution Place 1 drop into both eyes 2 (two) times daily.  Marland Kitchen ibuprofen (ADVIL) 200 MG tablet Take 200-400 mg by  mouth every 6 (six) hours as needed for fever.  . Misc Natural Products (GLUCOSAMINE CHOND DOUBLE STR PO) Take 1 tablet by mouth daily.  . Multiple Vitamins-Minerals (PRESERVISION AREDS 2+MULTI VIT) CAPS Take 1 capsule by mouth 2 (two) times daily.  . simvastatin (ZOCOR) 20 MG tablet Take 20 mg by mouth every evening.   No facility-administered encounter medications on file as of 07/26/2019.    Allergies as of 07/26/2019 - Review Complete 07/26/2019  Allergen Reaction Noted  . Brimonidine Other (See Comments) 01/27/2014    Past Medical History:  Diagnosis Date  . Branch retinal vein occlusion   . Dyslipidemia   . Glaucoma   . History of prostate cancer   . OSA on CPAP     Past Surgical History:  Procedure Laterality Date  . VIDEO ASSISTED THORACOSCOPY (VATS)/DECORTICATION  2012    History reviewed. No pertinent family history.  Social History   Socioeconomic History  . Marital status: Married    Spouse name: Not on file  . Number of children: Not on file  . Years of education: Not on file  . Highest education level: Not on file  Occupational History  . Occupation: retired  Tobacco Use  . Smoking status: Former Smoker    Packs/day: 1.00    Years: 40.00    Pack  years: 40.00    Types: Cigarettes  . Smokeless tobacco: Never Used  Substance and Sexual Activity  . Alcohol use: Yes    Alcohol/week: 7.0 standard drinks    Types: 7 Glasses of wine per week  . Drug use: Never  . Sexual activity: Not on file  Other Topics Concern  . Not on file  Social History Narrative  . Not on file   Social Determinants of Health   Financial Resource Strain:   . Difficulty of Paying Living Expenses:   Food Insecurity:   . Worried About Charity fundraiser in the Last Year:   . Arboriculturist in the Last Year:   Transportation Needs:   . Film/video editor (Medical):   Marland Kitchen Lack of Transportation (Non-Medical):   Physical Activity:   . Days of Exercise per Week:   .  Minutes of Exercise per Session:   Stress:   . Feeling of Stress :   Social Connections:   . Frequency of Communication with Friends and Family:   . Frequency of Social Gatherings with Friends and Family:   . Attends Religious Services:   . Active Member of Clubs or Organizations:   . Attends Archivist Meetings:   Marland Kitchen Marital Status:   Intimate Partner Violence:   . Fear of Current or Ex-Partner:   . Emotionally Abused:   Marland Kitchen Physically Abused:   . Sexually Abused:     Review of Systems  Respiratory: Positive for apnea.   Psychiatric/Behavioral: Positive for sleep disturbance.    Vitals:   07/26/19 1138  BP: 136/64  Pulse: 74  Temp: (!) 97.2 F (36.2 C)  SpO2: 98%     Physical Exam  Constitutional: He appears well-developed and well-nourished.  HENT:  Head: Normocephalic and atraumatic.  Eyes: Pupils are equal, round, and reactive to light. Conjunctivae are normal.  Neck: No tracheal deviation present. No thyromegaly present.  Cardiovascular: Normal rate and regular rhythm.  Pulmonary/Chest: Effort normal and breath sounds normal. No respiratory distress. He has no wheezes. He has no rales. He exhibits no tenderness.  Musculoskeletal:        General: Normal range of motion.     Cervical back: Normal range of motion and neck supple.  Neurological: He is alert.  Skin: Skin is warm and dry. No erythema.  Psychiatric: He has a normal mood and affect.   Data Reviewed: Sleep study from 2017 did reveal mild obstructive sleep apnea Titration study showed he was titrated to a pressure of 9 with residual AHI 1.4 Patient's AHI on this data that he keeps regularly shows AHI fluctuations from 4-13  Assessment:  Obstructive sleep apnea -May need optimized  He feels well, sleeps well He tolerates CPAP well  Plan/Recommendations: Increase pressure from 9-10  Obtain a compliance download in about 4 weeks  Encouraged to call us to let us know if the pressure is  adequate, well-tolerated  The goal is to help him continue to tolerate CPAP well and see whether we can improve his AHI without disrupting his sleep  I will follow-up with him in about 6 months   Sherrilyn Rist MD [facility] 07/26/2019, 12:19 PM  CC: Nicoletta Dress, MD  Reviewed patient CPAP compliance-from [facility]  100% compliance Average use of 8 hours 56 minutes No significant time in large leak Average AHI of 5.2 with a CPAP pressure of 9

## 2019-07-30 DIAGNOSIS — G4733 Obstructive sleep apnea (adult) (pediatric): Secondary | ICD-10-CM | POA: Diagnosis not present

## 2019-08-31 DIAGNOSIS — I471 Supraventricular tachycardia: Secondary | ICD-10-CM | POA: Diagnosis not present

## 2019-08-31 DIAGNOSIS — D638 Anemia in other chronic diseases classified elsewhere: Secondary | ICD-10-CM | POA: Diagnosis not present

## 2019-08-31 DIAGNOSIS — I6990 Unspecified sequelae of unspecified cerebrovascular disease: Secondary | ICD-10-CM | POA: Diagnosis not present

## 2019-08-31 DIAGNOSIS — R600 Localized edema: Secondary | ICD-10-CM | POA: Diagnosis not present

## 2019-08-31 DIAGNOSIS — E785 Hyperlipidemia, unspecified: Secondary | ICD-10-CM | POA: Diagnosis not present

## 2019-08-31 DIAGNOSIS — E538 Deficiency of other specified B group vitamins: Secondary | ICD-10-CM | POA: Diagnosis not present

## 2019-08-31 DIAGNOSIS — I10 Essential (primary) hypertension: Secondary | ICD-10-CM | POA: Diagnosis not present

## 2019-09-03 DIAGNOSIS — E538 Deficiency of other specified B group vitamins: Secondary | ICD-10-CM | POA: Diagnosis not present

## 2019-09-10 DIAGNOSIS — E538 Deficiency of other specified B group vitamins: Secondary | ICD-10-CM | POA: Diagnosis not present

## 2019-09-16 DIAGNOSIS — D649 Anemia, unspecified: Secondary | ICD-10-CM | POA: Diagnosis not present

## 2019-09-16 DIAGNOSIS — D72819 Decreased white blood cell count, unspecified: Secondary | ICD-10-CM | POA: Diagnosis not present

## 2019-09-16 DIAGNOSIS — C61 Malignant neoplasm of prostate: Secondary | ICD-10-CM | POA: Diagnosis not present

## 2019-09-17 DIAGNOSIS — E538 Deficiency of other specified B group vitamins: Secondary | ICD-10-CM | POA: Diagnosis not present

## 2019-09-24 DIAGNOSIS — E538 Deficiency of other specified B group vitamins: Secondary | ICD-10-CM | POA: Diagnosis not present

## 2019-10-25 DIAGNOSIS — E538 Deficiency of other specified B group vitamins: Secondary | ICD-10-CM | POA: Diagnosis not present

## 2019-11-22 DIAGNOSIS — H353131 Nonexudative age-related macular degeneration, bilateral, early dry stage: Secondary | ICD-10-CM | POA: Diagnosis not present

## 2019-11-22 DIAGNOSIS — H401133 Primary open-angle glaucoma, bilateral, severe stage: Secondary | ICD-10-CM | POA: Diagnosis not present

## 2019-11-24 DIAGNOSIS — G4733 Obstructive sleep apnea (adult) (pediatric): Secondary | ICD-10-CM | POA: Diagnosis not present

## 2019-11-26 DIAGNOSIS — E538 Deficiency of other specified B group vitamins: Secondary | ICD-10-CM | POA: Diagnosis not present

## 2019-12-01 DIAGNOSIS — D638 Anemia in other chronic diseases classified elsewhere: Secondary | ICD-10-CM | POA: Diagnosis not present

## 2019-12-01 DIAGNOSIS — R600 Localized edema: Secondary | ICD-10-CM | POA: Diagnosis not present

## 2019-12-01 DIAGNOSIS — I10 Essential (primary) hypertension: Secondary | ICD-10-CM | POA: Diagnosis not present

## 2019-12-01 DIAGNOSIS — I6990 Unspecified sequelae of unspecified cerebrovascular disease: Secondary | ICD-10-CM | POA: Diagnosis not present

## 2019-12-01 DIAGNOSIS — I471 Supraventricular tachycardia: Secondary | ICD-10-CM | POA: Diagnosis not present

## 2019-12-01 DIAGNOSIS — E785 Hyperlipidemia, unspecified: Secondary | ICD-10-CM | POA: Diagnosis not present

## 2019-12-01 DIAGNOSIS — D519 Vitamin B12 deficiency anemia, unspecified: Secondary | ICD-10-CM | POA: Diagnosis not present

## 2019-12-16 DIAGNOSIS — E875 Hyperkalemia: Secondary | ICD-10-CM | POA: Diagnosis not present

## 2019-12-31 DIAGNOSIS — D519 Vitamin B12 deficiency anemia, unspecified: Secondary | ICD-10-CM | POA: Diagnosis not present

## 2020-01-12 DIAGNOSIS — Z23 Encounter for immunization: Secondary | ICD-10-CM | POA: Diagnosis not present

## 2020-01-31 DIAGNOSIS — D519 Vitamin B12 deficiency anemia, unspecified: Secondary | ICD-10-CM | POA: Diagnosis not present

## 2020-02-17 DIAGNOSIS — Z9181 History of falling: Secondary | ICD-10-CM | POA: Diagnosis not present

## 2020-02-17 DIAGNOSIS — E785 Hyperlipidemia, unspecified: Secondary | ICD-10-CM | POA: Diagnosis not present

## 2020-02-17 DIAGNOSIS — Z0000 Encounter for general adult medical examination without abnormal findings: Secondary | ICD-10-CM | POA: Diagnosis not present

## 2020-02-21 ENCOUNTER — Ambulatory Visit: Payer: Medicare Other | Admitting: Pulmonary Disease

## 2020-02-21 ENCOUNTER — Other Ambulatory Visit: Payer: Self-pay

## 2020-02-21 ENCOUNTER — Encounter: Payer: Self-pay | Admitting: Pulmonary Disease

## 2020-02-21 VITALS — BP 126/60 | HR 74 | Temp 97.5°F | Ht 71.0 in | Wt 193.8 lb

## 2020-02-21 DIAGNOSIS — G4733 Obstructive sleep apnea (adult) (pediatric): Secondary | ICD-10-CM | POA: Diagnosis not present

## 2020-02-21 NOTE — Progress Notes (Signed)
[address] Lora Glomski    [id number]    February 07, 1925  Primary Care Physician:Schultz, Lora Havens, MD  Referring Physician: Nicoletta Dress, MD [address] [place],  [place] [address]  Chief complaint:   Patient with a history of mild obstructive sleep apnea for which he has been compliant with CPAP use  HPI:  Has used CPAP since 2017 CPAP pressure was increased from 9-10 during last visit  Patient keeps a recording of his AHI's Noted an increase in his AHI since the last change  He feels well generally  Has noticed a lot of mild coughs on his face when he wakes up in the morning from mask pressure  Diagnosed with mild obstructive sleep apnea Titrated to a pressure of 9 in the lab At some point he was on a pressure of 12 but was brought down to 9 secondary to mask leaks  Usually goes to bed between 9 and 10 PM He is a side sleeper Usually takes him about 5 minutes to an hour to fall asleep About 4 awakenings Final wake up time about 6 AM  He feels his sleep is restorative He feels he wakes up multiple times at night because of being on CPAP He does not wake up to use the bathroom He does have musculoskeletal pains and discomfort but does not feel this is contributing to his awakenings  He started on CPAP secondary to having tachycardia when he wakes up from sleep, this is controlled at present  He does keep a very good log about his machine  Currently uses a full facemask  Outpatient Encounter Medications as of 02/21/2020  Medication Sig  . acebutolol (SECTRAL) 400 MG capsule Take 400 mg by mouth daily.  Marland Kitchen aspirin EC 81 MG tablet Take 81 mg by mouth daily.  . Azelastine HCl 0.15 % SOLN Place 2 sprays into both nostrils daily.  . Calcium Carbonate-Vit D-Min (CALCIUM 1200 PO) Take 1,200 mg by mouth daily.  . cholecalciferol (VITAMIN D3) 25 MCG (1000 UT) tablet Take 1,000 Units by mouth daily.  Marland Kitchen DIOVAN 160 MG tablet Take 160 mg by  mouth 2 (two) times daily.   . dorzolamide-timolol (COSOPT) 22.3-6.8 MG/ML ophthalmic solution Place 1 drop into both eyes 2 (two) times daily.  . Misc Natural Products (GLUCOSAMINE CHOND DOUBLE STR PO) Take 1 tablet by mouth daily.  . Multiple Vitamins-Minerals (PRESERVISION AREDS 2+MULTI VIT) CAPS Take 1 capsule by mouth 2 (two) times daily.  . simvastatin (ZOCOR) 20 MG tablet Take 20 mg by mouth every evening.  Marland Kitchen ibuprofen (ADVIL) 200 MG tablet Take 200-400 mg by mouth every 6 (six) hours as needed for fever. (Patient not taking: Reported on 02/21/2020)  . [DISCONTINUED] dexamethasone (DECADRON) 6 MG tablet Take 1 tablet (6 mg total) by mouth daily. (Patient not taking: Reported on 02/21/2020)   No facility-administered encounter medications on file as of 02/21/2020.    Allergies as of 02/21/2020 - Review Complete 02/21/2020  Allergen Reaction Noted  . Brimonidine Other (See Comments) 01/27/2014    Past Medical History:  Diagnosis Date  . Branch retinal vein occlusion   . Dyslipidemia   . Glaucoma   . History of prostate cancer   . OSA on CPAP     Past Surgical History:  Procedure Laterality Date  . VIDEO ASSISTED THORACOSCOPY (VATS)/DECORTICATION  2012    No family history on file.  Social History   Socioeconomic History  .  Marital status: Married    Spouse name: Not on file  . Number of children: Not on file  . Years of education: Not on file  . Highest education level: Not on file  Occupational History  . Occupation: retired  Tobacco Use  . Smoking status: Former Smoker    Packs/day: 1.00    Years: 40.00    Pack years: 40.00    Types: Cigarettes  . Smokeless tobacco: Never Used  Substance and Sexual Activity  . Alcohol use: Yes    Alcohol/week: 7.0 standard drinks    Types: 7 Glasses of wine per week  . Drug use: Never  . Sexual activity: Not on file  Other Topics Concern  . Not on file  Social History Narrative  . Not on file   Social Determinants of  Health   Financial Resource Strain:   . Difficulty of Paying Living Expenses: Not on file  Food Insecurity:   . Worried About Charity fundraiser in the Last Year: Not on file  . Ran Out of Food in the Last Year: Not on file  Transportation Needs:   . Lack of Transportation (Medical): Not on file  . Lack of Transportation (Non-Medical): Not on file  Physical Activity:   . Days of Exercise per Week: Not on file  . Minutes of Exercise per Session: Not on file  Stress:   . Feeling of Stress : Not on file  Social Connections:   . Frequency of Communication with Friends and Family: Not on file  . Frequency of Social Gatherings with Friends and Family: Not on file  . Attends Religious Services: Not on file  . Active Member of Clubs or Organizations: Not on file  . Attends Archivist Meetings: Not on file  . Marital Status: Not on file  Intimate Partner Violence:   . Fear of Current or Ex-Partner: Not on file  . Emotionally Abused: Not on file  . Physically Abused: Not on file  . Sexually Abused: Not on file    Review of Systems  Respiratory: Positive for apnea.   Psychiatric/Behavioral: Positive for sleep disturbance.    Vitals:   02/21/20 1109  BP: 126/60  Pulse: 74  Temp: (!) 97.5 F (36.4 C)  SpO2: 97%     Physical Exam Constitutional:      Appearance: He is well-developed.  Neck:     Thyroid: No thyromegaly.     Trachea: No tracheal deviation.  Cardiovascular:     Rate and Rhythm: Normal rate and regular rhythm.  Pulmonary:     Effort: Pulmonary effort is normal. No respiratory distress.     Breath sounds: Normal breath sounds. No wheezing or rales.  Chest:     Chest wall: No tenderness.  Musculoskeletal:     Cervical back: No rigidity or tenderness.  Neurological:     Mental Status: He is alert.  Psychiatric:        Mood and Affect: Mood normal.    Data Reviewed: Sleep study from 2017 did reveal mild obstructive sleep apnea Titration study  showed he was titrated to a pressure of 9 with residual AHI 1.4 Patient's AHI on this data that he keeps regularly shows AHI fluctuations from 4-13  Compliance data reveals AHI 11  Assessment:  Obstructive sleep apnea -Pressure will be increased to 11 from 10  He feels well, sleeps well He tolerates CPAP well  Encouraged to not pull his mask as tightly  Plan/Recommendations: Increase pressure  from 10-11 Obtain a compliance download in about 4 weeks  Encouraged to call us to let us know if the pressure is adequate, well-tolerated  Continue adjusting CPAP, goal is for him to still tolerate CPAP, adjust pressures as needed  I will follow-up with him in about 3 months   Sherrilyn Rist MD [facility] 02/21/2020, 11:26 AM  CC: Nicoletta Dress, MD  Reviewed patient CPAP compliance-from [facility]  100% compliance Average use of 8 hours 56 minutes No significant time in large leak Average AHI of 5.2 with a CPAP pressure of 9

## 2020-02-21 NOTE — Patient Instructions (Signed)
Increase pressure to 11 from 10  I will see you back in 3 months  Call with significant concerns   Call if pressures are not well tolerated or you noticing leaks, if your AHI does increase significantly also call so we can back off on the pressures

## 2020-02-22 DIAGNOSIS — G4733 Obstructive sleep apnea (adult) (pediatric): Secondary | ICD-10-CM | POA: Diagnosis not present

## 2020-03-01 ENCOUNTER — Other Ambulatory Visit: Payer: Self-pay | Admitting: Hematology and Oncology

## 2020-03-01 DIAGNOSIS — D649 Anemia, unspecified: Secondary | ICD-10-CM

## 2020-03-01 DIAGNOSIS — E785 Hyperlipidemia, unspecified: Secondary | ICD-10-CM | POA: Diagnosis not present

## 2020-03-01 DIAGNOSIS — D519 Vitamin B12 deficiency anemia, unspecified: Secondary | ICD-10-CM | POA: Diagnosis not present

## 2020-03-01 DIAGNOSIS — I10 Essential (primary) hypertension: Secondary | ICD-10-CM | POA: Diagnosis not present

## 2020-03-01 DIAGNOSIS — I471 Supraventricular tachycardia: Secondary | ICD-10-CM | POA: Diagnosis not present

## 2020-03-01 DIAGNOSIS — I6990 Unspecified sequelae of unspecified cerebrovascular disease: Secondary | ICD-10-CM | POA: Diagnosis not present

## 2020-03-02 DIAGNOSIS — D519 Vitamin B12 deficiency anemia, unspecified: Secondary | ICD-10-CM | POA: Diagnosis not present

## 2020-03-02 DIAGNOSIS — D638 Anemia in other chronic diseases classified elsewhere: Secondary | ICD-10-CM | POA: Diagnosis not present

## 2020-03-02 DIAGNOSIS — E785 Hyperlipidemia, unspecified: Secondary | ICD-10-CM | POA: Diagnosis not present

## 2020-03-09 ENCOUNTER — Telehealth: Payer: Self-pay | Admitting: Pulmonary Disease

## 2020-03-09 NOTE — Telephone Encounter (Signed)
Spoke with [facility] in [place], they could not locate order. They would like [place] to fax original order to them at [phone]

## 2020-03-09 NOTE — Telephone Encounter (Signed)
Printed order will refax to [employer]

## 2020-03-15 ENCOUNTER — Encounter: Payer: Self-pay | Admitting: Oncology

## 2020-03-16 ENCOUNTER — Other Ambulatory Visit: Payer: Self-pay | Admitting: Oncology

## 2020-03-16 DIAGNOSIS — D649 Anemia, unspecified: Secondary | ICD-10-CM

## 2020-03-16 NOTE — Progress Notes (Signed)
[facility]  [address] [place],  [place]  [address] [phone]  Clinic Day:  03/17/2020  Referring physician: Nicoletta Dress, MD   HISTORY OF PRESENT ILLNESS:  The patient is a 86 y.o. male who I follow for mild leukopenia and anemia.  As his peripheral counts have held stable, they have been followed conservatively.  He comes in today for routine follow-up.  Since his last visit, the patient has been doing well.  He denies having any spontaneous infections as it pertains to his leukopenia.  He denies having any overt forms of blood loss as it pertains to his mild anemia.  Overall, he denies having any changes in his health over these past months.   PHYSICAL EXAM:  Blood pressure 133/61, pulse 77, temperature 98.1 F (36.7 C), resp. rate 16, height 5\' 11"  (1.803 m), weight 193 lb 11.2 oz (87.9 kg), SpO2 94 %. Wt Readings from Last 3 Encounters:  03/17/20 193 lb 11.2 oz (87.9 kg)  02/21/20 193 lb 12.8 oz (87.9 kg)  07/26/19 190 lb 3.2 oz (86.3 kg)   Body mass index is 27.02 kg/m. Performance status (ECOG): 0 Physical Exam Constitutional:      Appearance: Normal appearance. He is not ill-appearing.  HENT:     Mouth/Throat:     Mouth: Mucous membranes are moist.     Pharynx: Oropharynx is clear. No oropharyngeal exudate or posterior oropharyngeal erythema.  Cardiovascular:     Rate and Rhythm: Normal rate and regular rhythm.     Heart sounds: No murmur heard. No friction rub. No gallop.   Pulmonary:     Effort: Pulmonary effort is normal. No respiratory distress.     Breath sounds: Normal breath sounds. No wheezing, rhonchi or rales.  Chest:  Breasts:     Right: No axillary adenopathy or supraclavicular adenopathy.     Left: No axillary adenopathy or supraclavicular adenopathy.    Abdominal:     General: Bowel sounds are normal. There is no distension.     Palpations: Abdomen is soft. There is no mass.     Tenderness: There is  no abdominal tenderness.  Musculoskeletal:        General: No swelling.     Right lower leg: No edema.     Left lower leg: No edema.  Lymphadenopathy:     Cervical: No cervical adenopathy.     Upper Body:     Right upper body: No supraclavicular or axillary adenopathy.     Left upper body: No supraclavicular or axillary adenopathy.     Lower Body: No right inguinal adenopathy. No left inguinal adenopathy.  Skin:    General: Skin is warm.     Coloration: Skin is not jaundiced.     Findings: No lesion or rash.  Neurological:     General: No focal deficit present.     Mental Status: He is alert and oriented to person, place, and time. Mental status is at baseline.     Cranial Nerves: Cranial nerves are intact.  Psychiatric:        Mood and Affect: Mood normal.        Behavior: Behavior normal.        Thought Content: Thought content normal.     LABS:   CBC Latest Ref Rng & Units 03/17/2020 04/11/2019 04/10/2019  WBC - 6.3 7.6 8.3  Hemoglobin 13.5 - 17.5 11.9(A) 11.0(L) 10.7(L)  Hematocrit 41 - 53 36(A) 32.6(L) 31.7(L)  Platelets  150 - 399 125(A) 209 178   CMP Latest Ref Rng & Units 03/17/2020 04/11/2019 04/10/2019  Glucose 70 - 99 mg/dL - 120(H) 161(H)  BUN 4 - 21 21 44(H) 45(H)  Creatinine 0.6 - 1.3 1.2 0.95 0.95  Sodium 137 - 147 139 135 135  Potassium 3.4 - 5.3 4.5 4.2 4.3  Chloride 99 - 108 103 103 104  CO2 13 - 22 26(A) 23 22  Calcium 8.7 - 10.7 9.6 8.7(L) 8.7(L)  Total Protein 6.5 - 8.1 g/dL - 5.7(L) 5.6(L)  Total Bilirubin 0.3 - 1.2 mg/dL - 0.4 0.5  Alkaline Phos 25 - 125 63 49 46  AST 14 - 40 22 49(H) 26  ALT 10 - 40 15 84(H) 31    ASSESSMENT & PLAN:  Assessment/Plan:  A 86 y.o. male with mild leukopenia and anemia.  When evaluating his labs today, his white cell and hemoglobin levels remain fine.  There remains nothing which suggests an ominous hematologic process is present.   As he is doing well, I will continue to follow him conservatively and will see him back  in another 6 months for repeat clinical assessment.  The patient understands all the plans discussed today and is in agreement with them.     Jillana Selph Macarthur Critchley, MD

## 2020-03-17 ENCOUNTER — Inpatient Hospital Stay: Payer: Medicare Other | Attending: Oncology

## 2020-03-17 ENCOUNTER — Telehealth: Payer: Self-pay | Admitting: Oncology

## 2020-03-17 ENCOUNTER — Inpatient Hospital Stay (INDEPENDENT_AMBULATORY_CARE_PROVIDER_SITE_OTHER): Payer: Medicare Other | Admitting: Oncology

## 2020-03-17 ENCOUNTER — Other Ambulatory Visit: Payer: Self-pay | Admitting: Oncology

## 2020-03-17 ENCOUNTER — Other Ambulatory Visit: Payer: Self-pay

## 2020-03-17 ENCOUNTER — Telehealth: Payer: Self-pay | Admitting: Pulmonary Disease

## 2020-03-17 ENCOUNTER — Other Ambulatory Visit: Payer: Self-pay | Admitting: Hematology and Oncology

## 2020-03-17 VITALS — BP 133/61 | HR 77 | Temp 98.1°F | Resp 16 | Ht 71.0 in | Wt 193.7 lb

## 2020-03-17 DIAGNOSIS — Z0001 Encounter for general adult medical examination with abnormal findings: Secondary | ICD-10-CM | POA: Diagnosis not present

## 2020-03-17 DIAGNOSIS — C61 Malignant neoplasm of prostate: Secondary | ICD-10-CM | POA: Insufficient documentation

## 2020-03-17 DIAGNOSIS — D649 Anemia, unspecified: Secondary | ICD-10-CM

## 2020-03-17 DIAGNOSIS — D72829 Elevated white blood cell count, unspecified: Secondary | ICD-10-CM | POA: Diagnosis not present

## 2020-03-17 HISTORY — DX: Malignant neoplasm of prostate: C61

## 2020-03-17 LAB — COMPREHENSIVE METABOLIC PANEL
Albumin: 4.4 (ref 3.5–5.0)
Calcium: 9.6 (ref 8.7–10.7)

## 2020-03-17 LAB — CBC AND DIFFERENTIAL
HCT: 36 — AB (ref 41–53)
Hemoglobin: 11.9 — AB (ref 13.5–17.5)
Neutrophils Absolute: 4.16
Platelets: 125 — AB (ref 150–399)
WBC: 6.3

## 2020-03-17 LAB — HEPATIC FUNCTION PANEL
ALT: 15 (ref 10–40)
AST: 22 (ref 14–40)
Alkaline Phosphatase: 63 (ref 25–125)
Bilirubin, Total: 0.4

## 2020-03-17 LAB — BASIC METABOLIC PANEL
BUN: 21 (ref 4–21)
CO2: 26 — AB (ref 13–22)
Chloride: 103 (ref 99–108)
Creatinine: 1.2 (ref 0.6–1.3)
Glucose: 108
Potassium: 4.5 (ref 3.4–5.3)
Sodium: 139 (ref 137–147)

## 2020-03-17 LAB — CBC: RBC: 3.9 (ref 3.87–5.11)

## 2020-03-17 NOTE — Telephone Encounter (Signed)
Per 12/17 los next appt given to patient

## 2020-03-17 NOTE — Telephone Encounter (Signed)
[facility] has access to Epic but I have refaxed the referral order to them

## 2020-03-17 NOTE — Telephone Encounter (Signed)
When I called [facility] to make sure they did get the pressure change order this time I spoke with [facility] and she stated that she would pull the order out of Epic

## 2020-03-17 NOTE — Telephone Encounter (Signed)
Spoke with Beth at [facility]  She states that they never received the order for CPAP pressure change we sent on 02/21/20  Will forward to [facility] to resend to DME fax [phone] Thank you!

## 2020-04-03 DIAGNOSIS — D519 Vitamin B12 deficiency anemia, unspecified: Secondary | ICD-10-CM | POA: Diagnosis not present

## 2020-05-05 DIAGNOSIS — D519 Vitamin B12 deficiency anemia, unspecified: Secondary | ICD-10-CM | POA: Diagnosis not present

## 2020-05-15 DIAGNOSIS — I471 Supraventricular tachycardia: Secondary | ICD-10-CM | POA: Diagnosis not present

## 2020-05-15 DIAGNOSIS — I10 Essential (primary) hypertension: Secondary | ICD-10-CM | POA: Diagnosis not present

## 2020-05-15 DIAGNOSIS — R002 Palpitations: Secondary | ICD-10-CM | POA: Diagnosis not present

## 2020-05-16 ENCOUNTER — Encounter: Payer: Self-pay | Admitting: *Deleted

## 2020-05-16 ENCOUNTER — Encounter: Payer: Self-pay | Admitting: Cardiology

## 2020-05-21 DIAGNOSIS — Z79899 Other long term (current) drug therapy: Secondary | ICD-10-CM | POA: Diagnosis not present

## 2020-05-21 DIAGNOSIS — Z7982 Long term (current) use of aspirin: Secondary | ICD-10-CM | POA: Diagnosis not present

## 2020-05-21 DIAGNOSIS — R002 Palpitations: Secondary | ICD-10-CM | POA: Diagnosis not present

## 2020-05-21 DIAGNOSIS — E7800 Pure hypercholesterolemia, unspecified: Secondary | ICD-10-CM | POA: Diagnosis not present

## 2020-05-21 DIAGNOSIS — I493 Ventricular premature depolarization: Secondary | ICD-10-CM | POA: Diagnosis not present

## 2020-05-21 DIAGNOSIS — I10 Essential (primary) hypertension: Secondary | ICD-10-CM | POA: Diagnosis not present

## 2020-05-21 DIAGNOSIS — Z87891 Personal history of nicotine dependence: Secondary | ICD-10-CM | POA: Diagnosis not present

## 2020-05-21 DIAGNOSIS — R21 Rash and other nonspecific skin eruption: Secondary | ICD-10-CM | POA: Diagnosis not present

## 2020-05-21 DIAGNOSIS — I700 Atherosclerosis of aorta: Secondary | ICD-10-CM | POA: Diagnosis not present

## 2020-05-21 DIAGNOSIS — G4730 Sleep apnea, unspecified: Secondary | ICD-10-CM | POA: Diagnosis not present

## 2020-05-21 DIAGNOSIS — E785 Hyperlipidemia, unspecified: Secondary | ICD-10-CM | POA: Diagnosis not present

## 2020-05-22 ENCOUNTER — Ambulatory Visit (INDEPENDENT_AMBULATORY_CARE_PROVIDER_SITE_OTHER): Payer: Medicare Other

## 2020-05-22 ENCOUNTER — Other Ambulatory Visit: Payer: Self-pay | Admitting: Cardiology

## 2020-05-22 DIAGNOSIS — R002 Palpitations: Secondary | ICD-10-CM

## 2020-05-26 DIAGNOSIS — I10 Essential (primary) hypertension: Secondary | ICD-10-CM | POA: Diagnosis not present

## 2020-05-26 DIAGNOSIS — I493 Ventricular premature depolarization: Secondary | ICD-10-CM | POA: Diagnosis not present

## 2020-05-26 DIAGNOSIS — G4733 Obstructive sleep apnea (adult) (pediatric): Secondary | ICD-10-CM | POA: Diagnosis not present

## 2020-05-31 DIAGNOSIS — E538 Deficiency of other specified B group vitamins: Secondary | ICD-10-CM | POA: Insufficient documentation

## 2020-05-31 DIAGNOSIS — M19011 Primary osteoarthritis, right shoulder: Secondary | ICD-10-CM | POA: Insufficient documentation

## 2020-05-31 DIAGNOSIS — R600 Localized edema: Secondary | ICD-10-CM | POA: Insufficient documentation

## 2020-05-31 DIAGNOSIS — M5136 Other intervertebral disc degeneration, lumbar region: Secondary | ICD-10-CM | POA: Insufficient documentation

## 2020-05-31 DIAGNOSIS — I6990 Unspecified sequelae of unspecified cerebrovascular disease: Secondary | ICD-10-CM | POA: Insufficient documentation

## 2020-05-31 DIAGNOSIS — E041 Nontoxic single thyroid nodule: Secondary | ICD-10-CM | POA: Insufficient documentation

## 2020-05-31 DIAGNOSIS — C61 Malignant neoplasm of prostate: Secondary | ICD-10-CM | POA: Insufficient documentation

## 2020-06-02 ENCOUNTER — Ambulatory Visit: Payer: Medicare Other | Admitting: Cardiology

## 2020-06-02 ENCOUNTER — Other Ambulatory Visit: Payer: Self-pay

## 2020-06-02 ENCOUNTER — Encounter: Payer: Self-pay | Admitting: Cardiology

## 2020-06-02 VITALS — BP 130/74 | HR 93 | Ht 71.0 in | Wt 189.0 lb

## 2020-06-02 DIAGNOSIS — D638 Anemia in other chronic diseases classified elsewhere: Secondary | ICD-10-CM | POA: Diagnosis not present

## 2020-06-02 DIAGNOSIS — I493 Ventricular premature depolarization: Secondary | ICD-10-CM | POA: Insufficient documentation

## 2020-06-02 DIAGNOSIS — D519 Vitamin B12 deficiency anemia, unspecified: Secondary | ICD-10-CM | POA: Diagnosis not present

## 2020-06-02 DIAGNOSIS — E782 Mixed hyperlipidemia: Secondary | ICD-10-CM

## 2020-06-02 DIAGNOSIS — E785 Hyperlipidemia, unspecified: Secondary | ICD-10-CM | POA: Diagnosis not present

## 2020-06-02 DIAGNOSIS — I10 Essential (primary) hypertension: Secondary | ICD-10-CM

## 2020-06-02 DIAGNOSIS — I491 Atrial premature depolarization: Secondary | ICD-10-CM

## 2020-06-02 HISTORY — DX: Mixed hyperlipidemia: E78.2

## 2020-06-02 HISTORY — DX: Atrial premature depolarization: I49.1

## 2020-06-02 HISTORY — DX: Essential (primary) hypertension: I10

## 2020-06-02 HISTORY — DX: Ventricular premature depolarization: I49.3

## 2020-06-02 MED ORDER — DILTIAZEM HCL ER COATED BEADS 120 MG PO CP24: 120.0000 mg | ORAL_CAPSULE | Freq: Every day | ORAL | 3 refills | Status: DC

## 2020-06-02 NOTE — Patient Instructions (Addendum)
Medication Instructions:  Your physician has recommended you make the following change in your medication:  STOP: Acebutolol  START: Cardizem 120 mg daily *If you need a refill on your cardiac medications before your next appointment, please call your pharmacy*   Lab Work: None If you have labs (blood work) drawn today and your tests are completely normal, you will receive your results only by: Marland Kitchen MyChart Message (if you have MyChart) OR . A paper copy in the mail If you have any lab test that is abnormal or we need to change your treatment, we will call you to review the results.   Testing/Procedures: Your physician has requested that you have an echocardiogram. Echocardiography is a painless test that uses sound waves to create images of your heart. It provides your doctor with information about the size and shape of your heart and how well your heart's chambers and valves are working. This procedure takes approximately one hour. There are no restrictions for this procedure.    Follow-Up: At [facility], you and your health needs are our priority.  As part of our continuing mission to provide you with exceptional heart care, we have created designated Provider Care Teams.  These Care Teams include your primary Cardiologist (physician) and Advanced Practice Providers (APPs -  Physician Assistants and Nurse Practitioners) who all work together to provide you with the care you need, when you need it.  We recommend signing up for the patient portal called "MyChart".  Sign up information is provided on this After Visit Summary.  MyChart is used to connect with patients for Virtual Visits (Telemedicine).  Patients are able to view lab/test results, encounter notes, upcoming appointments, etc.  Non-urgent messages can be sent to your provider as well.   To learn more about what you can do with MyChart, go to [email or web address].    Your next appointment:   8 week(s)  The format for your  next appointment:   In Person  Provider:   Berniece Salines, DO   Other Instructions   Echocardiogram An echocardiogram is a test that uses sound waves (ultrasound) to produce images of the heart. Images from an echocardiogram can provide important information about:  Heart size and shape.  The size and thickness and movement of your heart's walls.  Heart muscle function and strength.  Heart valve function or if you have stenosis. Stenosis is when the heart valves are too narrow.  If blood is flowing backward through the heart valves (regurgitation).  A tumor or infectious growth around the heart valves.  Areas of heart muscle that are not working well because of poor blood flow or injury from a heart attack.  Aneurysm detection. An aneurysm is a weak or damaged part of an artery wall. The wall bulges out from the normal force of blood pumping through the body. Tell a health care provider about:  Any allergies you have.  All medicines you are taking, including vitamins, herbs, eye drops, creams, and over-the-counter medicines.  Any blood disorders you have.  Any surgeries you have had.  Any medical conditions you have.  Whether you are pregnant or may be pregnant. What are the risks? Generally, this is a safe test. However, problems may occur, including an allergic reaction to dye (contrast) that may be used during the test. What happens before the test? No specific preparation is needed. You may eat and drink normally. What happens during the test?  You will take off your clothes from the waist  up and put on a hospital gown.  Electrodes or electrocardiogram (ECG)patches may be placed on your chest. The electrodes or patches are then connected to a device that monitors your heart rate and rhythm.  You will lie down on a table for an ultrasound exam. A gel will be applied to your chest to help sound waves pass through your skin.  A handheld device, called a transducer,  will be pressed against your chest and moved over your heart. The transducer produces sound waves that travel to your heart and bounce back (or "echo" back) to the transducer. These sound waves will be captured in real-time and changed into images of your heart that can be viewed on a video monitor. The images will be recorded on a computer and reviewed by your health care provider.  You may be asked to change positions or hold your breath for a short time. This makes it easier to get different views or better views of your heart.  In some cases, you may receive contrast through an IV in one of your veins. This can improve the quality of the pictures from your heart. The procedure may vary among health care providers and hospitals.   What can I expect after the test? You may return to your normal, everyday life, including diet, activities, and medicines, unless your health care provider tells you not to do that. Follow these instructions at home:  It is up to you to get the results of your test. Ask your health care provider, or the department that is doing the test, when your results will be ready.  Keep all follow-up visits. This is important. Summary  An echocardiogram is a test that uses sound waves (ultrasound) to produce images of the heart.  Images from an echocardiogram can provide important information about the size and shape of your heart, heart muscle function, heart valve function, and other possible heart problems.  You do not need to do anything to prepare before this test. You may eat and drink normally.  After the echocardiogram is completed, you may return to your normal, everyday life, unless your health care provider tells you not to do that. This information is not intended to replace advice given to you by your health care provider. Make sure you discuss any questions you have with your health care provider. Document Revised: 11/09/2019 Document Reviewed:  11/09/2019 Elsevier Patient Education  2021 [employer].

## 2020-06-02 NOTE — Progress Notes (Signed)
Cardiology Office Note:    Date:  06/02/2020   ID:  Jimmy Lucks., DOB [birth year], MRN [id number]  PCP:  Nicoletta Dress, MD  Cardiologist:  Berniece Salines, DO  Electrophysiologist:  None   Referring MD: Nicoletta Dress, MD   Chief Complaint  Patient presents with  . Palpitations    History of Present Illness:    Jimmy Keay. is a 87 y.o. male with a hx of hyperlipidemia, hypertension, history of PVCs is here today to be evaluated for palpitations.  Patient tells me that this has been going on for 10 to 15 years.  Initially he was followed by Dr. Bettina Gavia he was started on acebutolol.  He has some relief.  Over the last several several months he has had worsening palpitations even on acebutolol.  He ended up in emergency department at [facility] where he was told that he had frequent PVCs.  Posthospitalization has a monitor was placed on the patient he is here today for follow-up visit.  He has not had any lightheadedness or syncope episodes.  He just tells me the palpitations are bothersome.  He has had some associated shortness of breath.  Past Medical History:  Diagnosis Date  . 'light-for-dates' infant with signs of fetal malnutrition 08/19/2016  . Acute respiratory disease due to COVID-19 virus 04/07/2019  . Anemia 10/17/2017  . Branch retinal vein occlusion   . Cardiac arrhythmia 07/23/2016  . Choroidal nevus, right 11/13/2017  . Dyslipidemia   . Edema of both legs   . Glaucoma   . History of prostate cancer   . Hypercholesterolemia 06/08/2018  . Hypertension 07/23/2016  . Iritis, recurrent, bilateral 12/09/2013  . Late effects of CVA (cerebrovascular accident)   . Leukopenia 10/17/2017  . Lumbar disc narrowing   . Macular edema 07/16/2012  . Malignant neoplasm of prostate ([place]) 03/17/2020  . OSA on CPAP   . Osteoarthritis of right glenohumeral joint   . Primary open angle glaucoma (POAG) of both eyes, moderate stage 07/16/2012  . Prostate cancer  ([place])   . PSVT (paroxysmal supraventricular tachycardia) ([place]) 08/12/2005  . Status post intraocular lens implant 07/16/2012  . Thyroid nodule   . Vitamin B12 deficiency     Past Surgical History:  Procedure Laterality Date  . ADENOIDECTOMY  1931  . CATARACT EXTRACTION Bilateral    Left on 05/04/96, Right on 05/08/1993  . HEMORROIDECTOMY  11/21/2014   Done by Kendell Bane  . PROSTATECTOMY  01/11/1999   Transurethral  . TEAR DUCT PROBING Right 07/2018   Tear duct surgery / Dr. Lorina Rabon  . VIDEO ASSISTED THORACOSCOPY (VATS)/DECORTICATION  2012    Current Medications: Current Meds  Medication Sig  . aspirin EC 81 MG tablet Take 81 mg by mouth daily.  . Azelastine HCl 0.15 % SOLN Place 2 sprays into both nostrils daily.  . calcium-vitamin D (OSCAL WITH D) 500-200 MG-UNIT tablet Take 1 tablet by mouth daily.  Marland Kitchen diltiazem (CARDIZEM CD) 120 MG 24 hr capsule Take 1 capsule (120 mg total) by mouth daily.  Marland Kitchen DIOVAN 160 MG tablet Take 160 mg by mouth 2 (two) times daily.   . dorzolamide-timolol (COSOPT) 22.3-6.8 MG/ML ophthalmic solution Place 1 drop into both eyes 2 (two) times daily.  . Glucosamine-Chondroitin (GLUCOSAMINE CHONDR COMPLEX PO) Take 1 tablet by mouth daily.  . Multiple Vitamins-Minerals (PRESERVISION AREDS 2+MULTI VIT) CAPS Take 1 capsule by mouth 2 (two) times daily.  . simvastatin (ZOCOR) 20 MG tablet Take 20  mg by mouth every evening.  . [DISCONTINUED] acebutolol (SECTRAL) 400 MG capsule Take 400 mg by mouth daily.     Allergies:   Patient has no known allergies.   Social History   Socioeconomic History  . Marital status: Married    Spouse name: Not on file  . Number of children: Not on file  . Years of education: Not on file  . Highest education level: Not on file  Occupational History  . Occupation: retired  Tobacco Use  . Smoking status: Former Smoker    Packs/day: 1.00    Years: 40.00    Pack years: 40.00    Types: Cigarettes    Quit date: 1971    Years  since quitting: 51.2  . Smokeless tobacco: Never Used  Substance and Sexual Activity  . Alcohol use: Yes    Alcohol/week: 7.0 standard drinks    Types: 7 Glasses of wine per week  . Drug use: Never  . Sexual activity: Not on file  Other Topics Concern  . Not on file  Social History Narrative  . Not on file   Social Determinants of Health   Financial Resource Strain: Not on file  Food Insecurity: Not on file  Transportation Needs: Not on file  Physical Activity: Not on file  Stress: Not on file  Social Connections: Not on file     Family History: The patient's family history includes Diabetes in his mother; Hypertension in his mother; Kidney disease in his father.  ROS:   Review of Systems  Constitution: Negative for decreased appetite, fever and weight gain.  HENT: Negative for congestion, ear discharge, hoarse voice and sore throat.   Eyes: Negative for discharge, redness, vision loss in right eye and visual halos.  Cardiovascular: Negative for chest pain, dyspnea on exertion, leg swelling, orthopnea and palpitations.  Respiratory: Negative for cough, hemoptysis, shortness of breath and snoring.   Endocrine: Negative for heat intolerance and polyphagia.  Hematologic/Lymphatic: Negative for bleeding problem. Does not bruise/bleed easily.  Skin: Negative for flushing, nail changes, rash and suspicious lesions.  Musculoskeletal: Negative for arthritis, joint pain, muscle cramps, myalgias, neck pain and stiffness.  Gastrointestinal: Negative for abdominal pain, bowel incontinence, diarrhea and excessive appetite.  Genitourinary: Negative for decreased libido, genital sores and incomplete emptying.  Neurological: Negative for brief paralysis, focal weakness, headaches and loss of balance.  Psychiatric/Behavioral: Negative for altered mental status, depression and suicidal ideas.  Allergic/Immunologic: Negative for HIV exposure and persistent infections.    EKGs/Labs/Other  Studies Reviewed:    The following studies were reviewed today:   EKG:  The ekg ordered today demonstrates sinus rhythm, heart rate 78 bpm with evidence of left ventricular hypertrophy.  Recent Labs: 03/17/2020: ALT 15; BUN 21; Creatinine 1.2; Hemoglobin 11.9; Platelets 125; Potassium 4.5; Sodium 139  Recent Lipid Panel    Component Value Date/Time   TRIG 124 04/07/2019 1039    Physical Exam:    VS:  BP 130/74 (BP Location: Left Arm, Patient Position: Sitting)   Pulse 93   Ht 5\' 11"  (1.803 m)   Wt 189 lb (85.7 kg)   SpO2 98%   BMI 26.36 kg/m     Wt Readings from Last 3 Encounters:  06/02/20 189 lb (85.7 kg)  05/15/20 193 lb (87.5 kg)  03/17/20 193 lb 11.2 oz (87.9 kg)     GEN: Well nourished, well developed in no acute distress HEENT: Normal NECK: No JVD; No carotid bruits LYMPHATICS: No lymphadenopathy CARDIAC: S1S2 noted,RRR,  no murmurs, rubs, gallops RESPIRATORY:  Clear to auscultation without rales, wheezing or rhonchi  ABDOMEN: Soft, non-tender, non-distended, +bowel sounds, no guarding. EXTREMITIES: No edema, No cyanosis, no clubbing MUSCULOSKELETAL:  No deformity  SKIN: Warm and dry NEUROLOGIC:  Alert and oriented x 3, non-focal PSYCHIATRIC:  Normal affect, good insight  ASSESSMENT:    1. PAC (premature atrial contraction)   2. PVC (premature ventricular contraction)   3. Primary hypertension   4. Mixed hyperlipidemia    PLAN:     He already has a monitor on him which she is going to send back next week.  Once this come back we will review his PVCs frequency and rule out any atrial fibrillation.  For now we will stop the acebutolol and started patient on Cardizem 120 mg today.  For his shortness of breath I am going to order an echocardiogram to assess his LV function.  Blood pressure acceptable no changes will be made  Hyperlipidemia - continue with current statin medication.  The patient is in agreement with the above plan. The patient left the  office in stable condition.  The patient will follow up in 8 weeks or sooner if needed.   Medication Adjustments/Labs and Tests Ordered: Current medicines are reviewed at length with the patient today.  Concerns regarding medicines are outlined above.  Orders Placed This Encounter  Procedures  . EKG 12-Lead  . ECHOCARDIOGRAM COMPLETE   Meds ordered this encounter  Medications  . diltiazem (CARDIZEM CD) 120 MG 24 hr capsule    Sig: Take 1 capsule (120 mg total) by mouth daily.    Dispense:  90 capsule    Refill:  3    Patient Instructions   Medication Instructions:  Your physician has recommended you make the following change in your medication:  STOP: Acebutolol  START: Cardizem 120 mg daily *If you need a refill on your cardiac medications before your next appointment, please call your pharmacy*   Lab Work: None If you have labs (blood work) drawn today and your tests are completely normal, you will receive your results only by: Marland Kitchen MyChart Message (if you have MyChart) OR . A paper copy in the mail If you have any lab test that is abnormal or we need to change your treatment, we will call you to review the results.   Testing/Procedures: Your physician has requested that you have an echocardiogram. Echocardiography is a painless test that uses sound waves to create images of your heart. It provides your doctor with information about the size and shape of your heart and how well your heart's chambers and valves are working. This procedure takes approximately one hour. There are no restrictions for this procedure.    Follow-Up: At [facility], you and your health needs are our priority.  As part of our continuing mission to provide you with exceptional heart care, we have created designated Provider Care Teams.  These Care Teams include your primary Cardiologist (physician) and Advanced Practice Providers (APPs -  Physician Assistants and Nurse Practitioners) who all work  together to provide you with the care you need, when you need it.  We recommend signing up for the patient portal called "MyChart".  Sign up information is provided on this After Visit Summary.  MyChart is used to connect with patients for Virtual Visits (Telemedicine).  Patients are able to view lab/test results, encounter notes, upcoming appointments, etc.  Non-urgent messages can be sent to your provider as well.   To learn  more about what you can do with MyChart, go to [email or web address].    Your next appointment:   8 week(s)  The format for your next appointment:   In Person  Provider:   Berniece Salines, DO   Other Instructions   Echocardiogram An echocardiogram is a test that uses sound waves (ultrasound) to produce images of the heart. Images from an echocardiogram can provide important information about:  Heart size and shape.  The size and thickness and movement of your heart's walls.  Heart muscle function and strength.  Heart valve function or if you have stenosis. Stenosis is when the heart valves are too narrow.  If blood is flowing backward through the heart valves (regurgitation).  A tumor or infectious growth around the heart valves.  Areas of heart muscle that are not working well because of poor blood flow or injury from a heart attack.  Aneurysm detection. An aneurysm is a weak or damaged part of an artery wall. The wall bulges out from the normal force of blood pumping through the body. Tell a health care provider about:  Any allergies you have.  All medicines you are taking, including vitamins, herbs, eye drops, creams, and over-the-counter medicines.  Any blood disorders you have.  Any surgeries you have had.  Any medical conditions you have.  Whether you are pregnant or may be pregnant. What are the risks? Generally, this is a safe test. However, problems may occur, including an allergic reaction to dye (contrast) that may be used during the  test. What happens before the test? No specific preparation is needed. You may eat and drink normally. What happens during the test?  You will take off your clothes from the waist up and put on a hospital gown.  Electrodes or electrocardiogram (ECG)patches may be placed on your chest. The electrodes or patches are then connected to a device that monitors your heart rate and rhythm.  You will lie down on a table for an ultrasound exam. A gel will be applied to your chest to help sound waves pass through your skin.  A handheld device, called a transducer, will be pressed against your chest and moved over your heart. The transducer produces sound waves that travel to your heart and bounce back (or "echo" back) to the transducer. These sound waves will be captured in real-time and changed into images of your heart that can be viewed on a video monitor. The images will be recorded on a computer and reviewed by your health care provider.  You may be asked to change positions or hold your breath for a short time. This makes it easier to get different views or better views of your heart.  In some cases, you may receive contrast through an IV in one of your veins. This can improve the quality of the pictures from your heart. The procedure may vary among health care providers and hospitals.   What can I expect after the test? You may return to your normal, everyday life, including diet, activities, and medicines, unless your health care provider tells you not to do that. Follow these instructions at home:  It is up to you to get the results of your test. Ask your health care provider, or the department that is doing the test, when your results will be ready.  Keep all follow-up visits. This is important. Summary  An echocardiogram is a test that uses sound waves (ultrasound) to produce images of the heart.  Images from an  echocardiogram can provide important information about the size and shape of  your heart, heart muscle function, heart valve function, and other possible heart problems.  You do not need to do anything to prepare before this test. You may eat and drink normally.  After the echocardiogram is completed, you may return to your normal, everyday life, unless your health care provider tells you not to do that. This information is not intended to replace advice given to you by your health care provider. Make sure you discuss any questions you have with your health care provider. Document Revised: 11/09/2019 Document Reviewed: 11/09/2019 Elsevier Patient Education  2021 [place].     Adopting a Healthy Lifestyle.  Know what a healthy weight is for you (roughly BMI <25) and aim to maintain this   Aim for 7+ servings of fruits and vegetables daily   65-80+ fluid ounces of water or unsweet tea for healthy kidneys   Limit to max 1 drink of alcohol per day; avoid smoking/tobacco   Limit animal fats in diet for cholesterol and heart health - choose grass fed whenever available   Avoid highly processed foods, and foods high in saturated/trans fats   Aim for low stress - take time to unwind and care for your mental health   Aim for 150 min of moderate intensity exercise weekly for heart health, and weights twice weekly for bone health   Aim for 7-9 hours of sleep daily   When it comes to diets, agreement about the perfect plan isnt easy to find, even among the experts. Experts at the [place] developed an idea known as the Healthy Eating Plate. Just imagine a plate divided into logical, healthy portions.   The emphasis is on diet quality:   Load up on vegetables and fruits - one-half of your plate: Aim for color and variety, and remember that potatoes dont count.   Go for whole grains - one-quarter of your plate: Whole wheat, barley, wheat berries, quinoa, oats, brown rice, and foods made with them. If you want pasta, go with whole wheat  pasta.   Protein power - one-quarter of your plate: Fish, chicken, beans, and nuts are all healthy, versatile protein sources. Limit red meat.   The diet, however, does go beyond the plate, offering a few other suggestions.   Use healthy plant oils, such as olive, canola, soy, corn, sunflower and peanut. Check the labels, and avoid partially hydrogenated oil, which have unhealthy trans fats.   If youre thirsty, drink water. Coffee and tea are good in moderation, but skip sugary drinks and limit milk and dairy products to one or two daily servings.   The type of carbohydrate in the diet is more important than the amount. Some sources of carbohydrates, such as vegetables, fruits, whole grains, and beans-are healthier than others.   Finally, stay active  Signed, Berniece Salines, DO  06/02/2020 2:08 PM    [facility]

## 2020-06-15 DIAGNOSIS — R002 Palpitations: Secondary | ICD-10-CM | POA: Diagnosis not present

## 2020-06-19 DIAGNOSIS — J069 Acute upper respiratory infection, unspecified: Secondary | ICD-10-CM | POA: Diagnosis not present

## 2020-06-20 ENCOUNTER — Telehealth: Payer: Self-pay | Admitting: Cardiology

## 2020-06-20 NOTE — Telephone Encounter (Signed)
Pt c/o medication issue:  1. Name of Medication: diltiazem (CARDIZEM CD) 120 MG 24 hr capsule  2. How are you currently taking this medication (dosage and times per day)? 1 table a day  3. Are you having a reaction (difficulty breathing--STAT)? no  4. What is your medication issue? Patients states prior to get the new medicine he was on acebutolol he was 50% have ventricular contraction now that he is taking the diltiazem he is saying its about 100% contraction. He is not sure if its being generic brand and not the orginally has anything to do with it but he just mainly want to voice his concern with the new meds Please advise

## 2020-06-21 NOTE — Telephone Encounter (Signed)
We can change him to Acebutolol 200mg  daily if you prefers to stop the cardizem

## 2020-06-21 NOTE — Telephone Encounter (Signed)
Spoke with patient in regards to Cardizem and changing to Acebutolol 200 mg daily. He states "That is only 1/4 the amount of what I was taking. I was taking 400 mg twice a day. I just wanted to let Dr. Harriet Masson know what I thought my reaction is to the Cardizem and I want her to make a judgement. I don't want to just change medications. I will be up there Monday for an ultrasound. But I will not see her will I?" Patient informed he will not see her but I will relay this information to her and call him back.

## 2020-06-21 NOTE — Telephone Encounter (Signed)
Spoke with patient and gave him Dr. Terrial Rhodes recommendations. He verbalized understanding and thanked me for calling him back.

## 2020-06-29 DIAGNOSIS — D3131 Benign neoplasm of right choroid: Secondary | ICD-10-CM | POA: Diagnosis not present

## 2020-06-29 DIAGNOSIS — Z961 Presence of intraocular lens: Secondary | ICD-10-CM | POA: Diagnosis not present

## 2020-06-29 DIAGNOSIS — H401133 Primary open-angle glaucoma, bilateral, severe stage: Secondary | ICD-10-CM | POA: Diagnosis not present

## 2020-06-29 DIAGNOSIS — H353131 Nonexudative age-related macular degeneration, bilateral, early dry stage: Secondary | ICD-10-CM | POA: Diagnosis not present

## 2020-07-03 ENCOUNTER — Ambulatory Visit (INDEPENDENT_AMBULATORY_CARE_PROVIDER_SITE_OTHER): Payer: Medicare Other

## 2020-07-03 ENCOUNTER — Other Ambulatory Visit: Payer: Self-pay

## 2020-07-03 DIAGNOSIS — I493 Ventricular premature depolarization: Secondary | ICD-10-CM | POA: Diagnosis not present

## 2020-07-03 DIAGNOSIS — I491 Atrial premature depolarization: Secondary | ICD-10-CM | POA: Diagnosis not present

## 2020-07-03 DIAGNOSIS — R0602 Shortness of breath: Secondary | ICD-10-CM

## 2020-07-03 LAB — ECHOCARDIOGRAM COMPLETE
Area-P 1/2: 2.47 cm2
S' Lateral: 2.6 cm

## 2020-07-03 NOTE — Progress Notes (Signed)
Complete echocardiogram performed.  Jimmy Ketina Mars RDCS, RVT  

## 2020-07-04 DIAGNOSIS — D519 Vitamin B12 deficiency anemia, unspecified: Secondary | ICD-10-CM | POA: Diagnosis not present

## 2020-07-19 DIAGNOSIS — G4733 Obstructive sleep apnea (adult) (pediatric): Secondary | ICD-10-CM | POA: Diagnosis not present

## 2020-07-25 ENCOUNTER — Other Ambulatory Visit: Payer: Self-pay

## 2020-07-28 ENCOUNTER — Ambulatory Visit: Payer: Medicare Other | Admitting: Cardiology

## 2020-08-01 ENCOUNTER — Other Ambulatory Visit: Payer: Self-pay

## 2020-08-01 ENCOUNTER — Ambulatory Visit: Payer: Medicare Other | Admitting: Cardiology

## 2020-08-01 VITALS — BP 140/88 | HR 84 | Ht 72.0 in | Wt 186.0 lb

## 2020-08-01 DIAGNOSIS — Z9989 Dependence on other enabling machines and devices: Secondary | ICD-10-CM

## 2020-08-01 DIAGNOSIS — E782 Mixed hyperlipidemia: Secondary | ICD-10-CM

## 2020-08-01 DIAGNOSIS — I10 Essential (primary) hypertension: Secondary | ICD-10-CM

## 2020-08-01 DIAGNOSIS — G4733 Obstructive sleep apnea (adult) (pediatric): Secondary | ICD-10-CM | POA: Diagnosis not present

## 2020-08-01 DIAGNOSIS — E559 Vitamin D deficiency, unspecified: Secondary | ICD-10-CM | POA: Diagnosis not present

## 2020-08-01 DIAGNOSIS — I493 Ventricular premature depolarization: Secondary | ICD-10-CM | POA: Diagnosis not present

## 2020-08-01 MED ORDER — DILTIAZEM HCL ER COATED BEADS 240 MG PO CP24: 240.0000 mg | ORAL_CAPSULE | Freq: Every day | ORAL | 3 refills | Status: DC

## 2020-08-01 NOTE — Patient Instructions (Addendum)
Medication Instructions:  Your physician has recommended you make the following change in your medication:  INCREASE: Cardizem 240 mg once daily *If you need a refill on your cardiac medications before your next appointment, please call your pharmacy*   Lab Work: Your physician recommends that you return for lab work: TODAY: Vitamin D If you have labs (blood work) drawn today and your tests are completely normal, you will receive your results only by: Marland Kitchen MyChart Message (if you have MyChart) OR . A paper copy in the mail If you have any lab test that is abnormal or we need to change your treatment, we will call you to review the results.   Testing/Procedures: None   Follow-Up: At [facility], you and your health needs are our priority.  As part of our continuing mission to provide you with exceptional heart care, we have created designated Provider Care Teams.  These Care Teams include your primary Cardiologist (physician) and Advanced Practice Providers (APPs -  Physician Assistants and Nurse Practitioners) who all work together to provide you with the care you need, when you need it.  We recommend signing up for the patient portal called "MyChart".  Sign up information is provided on this After Visit Summary.  MyChart is used to connect with patients for Virtual Visits (Telemedicine).  Patients are able to view lab/test results, encounter notes, upcoming appointments, etc.  Non-urgent messages can be sent to your provider as well.   To learn more about what you can do with MyChart, go to [email or web address].    Your next appointment:   6 week(s)  The format for your next appointment:   In Person  Provider:   Berniece Salines, DO   Other Instructions

## 2020-08-01 NOTE — Progress Notes (Signed)
Cardiology Office Note:    Date:  08/01/2020   ID:  Jimmy Grant., DOB [birth year], MRN [id number]  PCP:  Nicoletta Dress, MD  Cardiologist:  Berniece Salines, DO  Electrophysiologist:  None   Referring MD: Nicoletta Dress, MD  I am still having palpitations.  History of Present Illness:    Jimmy Grant. is a 87 y.o. male with a hx of  hyperlipidemia, hypertension, history of PVCs is here today to be evaluated for palpitations.  Patient tells me that this has been going on for 10 to 15 years.  Initially he was followed by Dr. Bettina Gavia he was started on acebutolol.  He has some relief.  Over the last several several months he has had worsening palpitations even on acebutolol.  He ended up in emergency department at [facility] where he was told that he had frequent PVCs.  I saw the patient initially on June 02, 2020 at that time he was post hospitalization where he had frequent PVCs and a monitor had been placed on him.  Given he was experiencing significant PVCs on his acebutolol has stopped the medication started the patient on Cardizem 120 mg daily.  In the interim his monitor had come back showing frequent PVCs.  Today he tells me he still is experiencing palpitations.  He thinks that the 120 of Cardizem helps a little bit but not improved his symptoms at all.  Past Medical History:  Diagnosis Date  . 'light-for-dates' infant with signs of fetal malnutrition 08/19/2016  . Acute respiratory disease due to COVID-19 virus 04/07/2019  . Anemia 10/17/2017  . Branch retinal vein occlusion   . Cardiac arrhythmia 07/23/2016  . Choroidal nevus, right 11/13/2017  . Dyslipidemia   . Edema of both legs   . Glaucoma   . History of prostate cancer   . Hypercholesterolemia 06/08/2018  . Hypertension 07/23/2016  . Iritis, recurrent, bilateral 12/09/2013  . Late effects of CVA (cerebrovascular accident)   . Leukopenia 10/17/2017  . Lumbar disc narrowing   . Macular edema  07/16/2012  . Malignant neoplasm of prostate ([place]) 03/17/2020  . Mixed hyperlipidemia 06/02/2020  . OSA on CPAP   . Osteoarthritis of right glenohumeral joint   . PAC (premature atrial contraction) 06/02/2020  . Primary hypertension 06/02/2020  . Primary open angle glaucoma (POAG) of both eyes, moderate stage 07/16/2012  . Prostate cancer ([place])   . PSVT (paroxysmal supraventricular tachycardia) ([place]) 08/12/2005  . PVC (premature ventricular contraction) 06/02/2020  . Status post intraocular lens implant 07/16/2012  . Thyroid nodule   . Vitamin B12 deficiency     Past Surgical History:  Procedure Laterality Date  . ADENOIDECTOMY  1931  . CATARACT EXTRACTION Bilateral    Left on 05/04/96, Right on 05/08/1993  . HEMORROIDECTOMY  11/21/2014   Done by Kendell Bane  . PROSTATECTOMY  01/11/1999   Transurethral  . TEAR DUCT PROBING Right 07/2018   Tear duct surgery / Dr. Lorina Rabon  . VIDEO ASSISTED THORACOSCOPY (VATS)/DECORTICATION  2012    Current Medications: Current Meds  Medication Sig  . aspirin EC 81 MG tablet Take 81 mg by mouth daily.  . Azelastine HCl 0.15 % SOLN Place 2 sprays into both nostrils daily.  . calcium-vitamin D (OSCAL WITH D) 500-200 MG-UNIT tablet Take 1 tablet by mouth daily.  Marland Kitchen diltiazem (CARDIZEM CD) 240 MG 24 hr capsule Take 1 capsule (240 mg total) by mouth daily.  Marland Kitchen DIOVAN 160 MG tablet Take 160  mg by mouth 2 (two) times daily.   . dorzolamide-timolol (COSOPT) 22.3-6.8 MG/ML ophthalmic solution Place 1 drop into both eyes 2 (two) times daily.  . Glucosamine-Chondroitin (GLUCOSAMINE CHONDR COMPLEX PO) Take 1 tablet by mouth daily.  . Multiple Vitamins-Minerals (PRESERVISION AREDS 2+MULTI VIT) CAPS Take 1 capsule by mouth 2 (two) times daily.  . simvastatin (ZOCOR) 20 MG tablet Take 20 mg by mouth every evening.  . [DISCONTINUED] diltiazem (CARDIZEM CD) 120 MG 24 hr capsule Take 1 capsule (120 mg total) by mouth daily.     Allergies:   Patient has no known allergies.    Social History   Socioeconomic History  . Marital status: Married    Spouse name: Not on file  . Number of children: Not on file  . Years of education: Not on file  . Highest education level: Not on file  Occupational History  . Occupation: retired  Tobacco Use  . Smoking status: Former Smoker    Packs/day: 1.00    Years: 40.00    Pack years: 40.00    Types: Cigarettes    Quit date: 1971    Years since quitting: 51.3  . Smokeless tobacco: Never Used  Substance and Sexual Activity  . Alcohol use: Yes    Alcohol/week: 7.0 standard drinks    Types: 7 Glasses of wine per week  . Drug use: Never  . Sexual activity: Not on file  Other Topics Concern  . Not on file  Social History Narrative  . Not on file   Social Determinants of Health   Financial Resource Strain: Not on file  Food Insecurity: Not on file  Transportation Needs: Not on file  Physical Activity: Not on file  Stress: Not on file  Social Connections: Not on file     Family History: The patient's family history includes Diabetes in his mother; Hypertension in his mother; Kidney disease in his father.  ROS:   Review of Systems  Constitution: Negative for decreased appetite, fever and weight gain.  HENT: Negative for congestion, ear discharge, hoarse voice and sore throat.   Eyes: Negative for discharge, redness, vision loss in right eye and visual halos.  Cardiovascular: Negative for chest pain, dyspnea on exertion, leg swelling, orthopnea and palpitations.  Respiratory: Negative for cough, hemoptysis, shortness of breath and snoring.   Endocrine: Negative for heat intolerance and polyphagia.  Hematologic/Lymphatic: Negative for bleeding problem. Does not bruise/bleed easily.  Skin: Negative for flushing, nail changes, rash and suspicious lesions.  Musculoskeletal: Negative for arthritis, joint pain, muscle cramps, myalgias, neck pain and stiffness.  Gastrointestinal: Negative for abdominal pain, bowel  incontinence, diarrhea and excessive appetite.  Genitourinary: Negative for decreased libido, genital sores and incomplete emptying.  Neurological: Negative for brief paralysis, focal weakness, headaches and loss of balance.  Psychiatric/Behavioral: Negative for altered mental status, depression and suicidal ideas.  Allergic/Immunologic: Negative for HIV exposure and persistent infections.    EKGs/Labs/Other Studies Reviewed:    The following studies were reviewed today:   EKG: None today  July 03, 2020 IMPRESSIONS  1. Frequent PVCs during the study.  2. Left ventricular ejection fraction, by estimation, is 55 to 60%. The  left ventricle has normal function. The left ventricle has no regional  wall motion abnormalities. There is mild concentric left ventricular  hypertrophy. Left ventricular diastolic  parameters are consistent with Grade I diastolic dysfunction (impaired  relaxation). The average left ventricular global longitudinal strain is  -8.3 %. The global longitudinal strain is abnormal.  3. Right ventricular systolic function is normal. The right ventricular  size is normal. There is normal pulmonary artery systolic pressure.  4. The mitral valve is normal in structure. No evidence of mitral valve  regurgitation. No evidence of mitral stenosis.  5. The aortic valve is normal in structure. Aortic valve regurgitation is  not visualized. No aortic stenosis is present.  6. Abdominal aorta mildly dilalated (2.3 cm).  7. No pericardial effusion.   FINDINGS  Left Ventricle: Left ventricular ejection fraction, by estimation, is 55  to 60%. The left ventricle has normal function. The left ventricle has no  regional wall motion abnormalities. The average left ventricular global  longitudinal strain is -8.3 %. The  global longitudinal strain is abnormal. The left ventricular internal  cavity size was normal in size. There is mild concentric left ventricular   hypertrophy. Left ventricular diastolic parameters are consistent with  Grade I diastolic dysfunction  (impaired relaxation).   Right Ventricle: The right ventricular size is normal. No increase in  right ventricular wall thickness. Right ventricular systolic function is  normal. There is normal pulmonary artery systolic pressure. The tricuspid  regurgitant velocity is 2.55 m/s, and  with an assumed right atrial pressure of 3 mmHg, the estimated right  ventricular systolic pressure is 0000000 mmHg.   Left Atrium: Left atrial size was normal in size.   Right Atrium: Right atrial size was normal in size.   Pericardium: There is no evidence of pericardial effusion. Presence of  pericardial fat pad.   Mitral Valve: The mitral valve is normal in structure. No evidence of  mitral valve regurgitation. No evidence of mitral valve stenosis.   Tricuspid Valve: The tricuspid valve is normal in structure. Tricuspid  valve regurgitation is not demonstrated. No evidence of tricuspid  stenosis.   Aortic Valve: The aortic valve is normal in structure. Aortic valve  regurgitation is not visualized. No aortic stenosis is present.   Pulmonic Valve: The pulmonic valve was normal in structure. Pulmonic valve  regurgitation is not visualized. No evidence of pulmonic stenosis.   Aorta: Abdominal aorta mildly dilalated (2.3 cm). The aortic root is  normal in size and structure.   Venous: The inferior vena cava is normal in size with greater than 50%  respiratory variability, suggesting right atrial pressure of 3 mmHg.   IAS/Shunts: No atrial level shunt detected by color flow Doppler.      ZIO monitor The patient wore the monitor for 13 days 19 hours starting May 22, 2020 Indication: Palpitations  The minimum heart rate was 32 bpm, maximum heart rate was 165 bpm, and average heart rate was 71 bpm. Predominant underlying rhythm was Sinus Rhythm. Second Degree AV Block-Mobitz I  (Wenckebach) was present   Premature atrial complexes were rare less than 1%. Premature Ventricular complexes where frequent (9.4%, [id number]).  No pauses, No AV block and no atrial fibrillation present. No patient triggered events or diary events were noted  Conclusion: This study is remarkable for the following:                                  1.  Frequent premature ventricular complex.                                  2.  Single episode of type I second-degree AV block (Wenckebach)-this is  nonpathological.   Recent Labs: 03/17/2020: ALT 15; BUN 21; Creatinine 1.2; Hemoglobin 11.9; Platelets 125; Potassium 4.5; Sodium 139  Recent Lipid Panel    Component Value Date/Time   TRIG 124 04/07/2019 1039    Physical Exam:    VS:  BP 140/88   Pulse 84   Ht 6' (1.829 m)   Wt 186 lb (84.4 kg)   SpO2 98%   BMI 25.23 kg/m     Wt Readings from Last 3 Encounters:  08/01/20 186 lb (84.4 kg)  06/02/20 189 lb (85.7 kg)  05/15/20 193 lb (87.5 kg)     GEN: Well nourished, well developed in no acute distress HEENT: Normal NECK: No JVD; No carotid bruits LYMPHATICS: No lymphadenopathy CARDIAC: S1S2 noted,RRR, no murmurs, rubs, gallops RESPIRATORY:  Clear to auscultation without rales, wheezing or rhonchi  ABDOMEN: Soft, non-tender, non-distended, +bowel sounds, no guarding. EXTREMITIES: No edema, No cyanosis, no clubbing MUSCULOSKELETAL:  No deformity  SKIN: Warm and dry NEUROLOGIC:  Alert and oriented x 3, non-focal PSYCHIATRIC:  Normal affect, good insight  ASSESSMENT:    1. Frequent PVCs   2. Vitamin D deficiency   3. Primary hypertension   4. OSA on CPAP   5. Mixed hyperlipidemia    PLAN:     Since he still having symptoms that his blood pressure can tolerate and will increase his Cardizem to 240 mg daily.  If he had no relief with the Cardizem will consider amiodarone for his frequent PVCs.  His echocardiogram which was done on April 4 results have been discussed  with the patient I was able to talk to him about again today.  He also notes significant fatigue so we will get vitamin D levels given his history of vitamin D deficiency.  The patient is in agreement with the above plan. The patient left the office in stable condition.  The patient will follow up in 6 weeks due to medication change.   Medication Adjustments/Labs and Tests Ordered: Current medicines are reviewed at length with the patient today.  Concerns regarding medicines are outlined above.  Orders Placed This Encounter  Procedures  . VITAMIN D 25 Hydroxy (Vit-D Deficiency, Fractures)   Meds ordered this encounter  Medications  . diltiazem (CARDIZEM CD) 240 MG 24 hr capsule    Sig: Take 1 capsule (240 mg total) by mouth daily.    Dispense:  90 capsule    Refill:  3    Patient Instructions  Medication Instructions:  Your physician has recommended you make the following change in your medication:  INCREASE: Cardizem 240 mg once daily *If you need a refill on your cardiac medications before your next appointment, please call your pharmacy*   Lab Work: Your physician recommends that you return for lab work: TODAY: Vitamin D If you have labs (blood work) drawn today and your tests are completely normal, you will receive your results only by: Marland Kitchen MyChart Message (if you have MyChart) OR . A paper copy in the mail If you have any lab test that is abnormal or we need to change your treatment, we will call you to review the results.   Testing/Procedures: None   Follow-Up: At [facility], you and your health needs are our priority.  As part of our continuing mission to provide you with exceptional heart care, we have created designated Provider Care Teams.  These Care Teams include your primary Cardiologist (physician) and Advanced Practice Providers (APPs -  Physician Assistants and Nurse Practitioners) who  all work together to provide you with the care you need, when you need  it.  We recommend signing up for the patient portal called "MyChart".  Sign up information is provided on this After Visit Summary.  MyChart is used to connect with patients for Virtual Visits (Telemedicine).  Patients are able to view lab/test results, encounter notes, upcoming appointments, etc.  Non-urgent messages can be sent to your provider as well.   To learn more about what you can do with MyChart, go to [email or web address].    Your next appointment:   6 week(s)  The format for your next appointment:   In Person  Provider:   Berniece Salines, DO   Other Instructions      Adopting a Healthy Lifestyle.  Know what a healthy weight is for you (roughly BMI <25) and aim to maintain this   Aim for 7+ servings of fruits and vegetables daily   65-80+ fluid ounces of water or unsweet tea for healthy kidneys   Limit to max 1 drink of alcohol per day; avoid smoking/tobacco   Limit animal fats in diet for cholesterol and heart health - choose grass fed whenever available   Avoid highly processed foods, and foods high in saturated/trans fats   Aim for low stress - take time to unwind and care for your mental health   Aim for 150 min of moderate intensity exercise weekly for heart health, and weights twice weekly for bone health   Aim for 7-9 hours of sleep daily   When it comes to diets, agreement about the perfect plan isnt easy to find, even among the experts. Experts at the [employer] developed an idea known as the Healthy Eating Plate. Just imagine a plate divided into logical, healthy portions.   The emphasis is on diet quality:   Load up on vegetables and fruits - one-half of your plate: Aim for color and variety, and remember that potatoes dont count.   Go for whole grains - one-quarter of your plate: Whole wheat, barley, wheat berries, quinoa, oats, brown rice, and foods made with them. If you want pasta, go with whole wheat pasta.   Protein  power - one-quarter of your plate: Fish, chicken, beans, and nuts are all healthy, versatile protein sources. Limit red meat.   The diet, however, does go beyond the plate, offering a few other suggestions.   Use healthy plant oils, such as olive, canola, soy, corn, sunflower and peanut. Check the labels, and avoid partially hydrogenated oil, which have unhealthy trans fats.   If youre thirsty, drink water. Coffee and tea are good in moderation, but skip sugary drinks and limit milk and dairy products to one or two daily servings.   The type of carbohydrate in the diet is more important than the amount. Some sources of carbohydrates, such as vegetables, fruits, whole grains, and beans-are healthier than others.   Finally, stay active  Signed, Berniece Salines, DO  08/01/2020 5:00 PM    [place]

## 2020-08-02 ENCOUNTER — Ambulatory Visit: Payer: Medicare Other | Admitting: Cardiology

## 2020-08-02 LAB — VITAMIN D 25 HYDROXY (VIT D DEFICIENCY, FRACTURES): Vit D, 25-Hydroxy: 23.8 ng/mL — ABNORMAL LOW (ref 30.0–100.0)

## 2020-08-07 DIAGNOSIS — D519 Vitamin B12 deficiency anemia, unspecified: Secondary | ICD-10-CM | POA: Diagnosis not present

## 2020-08-09 ENCOUNTER — Other Ambulatory Visit: Payer: Self-pay

## 2020-08-09 MED ORDER — VITAMIN D (ERGOCALCIFEROL) 1.25 MG (50000 UNIT) PO CAPS: 50000.0000 [IU] | ORAL_CAPSULE | ORAL | 0 refills | Status: DC

## 2020-08-09 NOTE — Progress Notes (Signed)
Prescription sent to pharmacy.

## 2020-09-04 DIAGNOSIS — E785 Hyperlipidemia, unspecified: Secondary | ICD-10-CM | POA: Diagnosis not present

## 2020-09-04 DIAGNOSIS — I6990 Unspecified sequelae of unspecified cerebrovascular disease: Secondary | ICD-10-CM | POA: Diagnosis not present

## 2020-09-04 DIAGNOSIS — Z139 Encounter for screening, unspecified: Secondary | ICD-10-CM | POA: Diagnosis not present

## 2020-09-04 DIAGNOSIS — I471 Supraventricular tachycardia: Secondary | ICD-10-CM | POA: Diagnosis not present

## 2020-09-04 DIAGNOSIS — D638 Anemia in other chronic diseases classified elsewhere: Secondary | ICD-10-CM | POA: Diagnosis not present

## 2020-09-04 DIAGNOSIS — R600 Localized edema: Secondary | ICD-10-CM | POA: Diagnosis not present

## 2020-09-04 DIAGNOSIS — I10 Essential (primary) hypertension: Secondary | ICD-10-CM | POA: Diagnosis not present

## 2020-09-04 DIAGNOSIS — D519 Vitamin B12 deficiency anemia, unspecified: Secondary | ICD-10-CM | POA: Diagnosis not present

## 2020-09-07 DIAGNOSIS — D519 Vitamin B12 deficiency anemia, unspecified: Secondary | ICD-10-CM | POA: Diagnosis not present

## 2020-09-12 NOTE — Progress Notes (Signed)
[facility]  [address] [place],  [place]  [address] [phone]  Clinic Day:  09/15/2020  Referring physician: Nicoletta Dress, MD  This document serves as a record of services personally performed by [name], MD. It was created on their behalf by [facility] E, a trained medical scribe. The creation of this record is based on the scribe's personal observations and the provider's statements to them.  HISTORY OF PRESENT ILLNESS:  The patient is a 87 y.o. male who I follow for mild leukopenia and anemia.  As his peripheral counts have held stable, they have been followed conservatively.  He comes in today for routine follow-up.  Since his last visit, the patient has been doing well.  He denies having any spontaneous infections as it pertains to his leukopenia.  He denies having any overt forms of blood loss as it pertains to his mild anemia.  Overall, he denies having any changes in his health over these past months.   PHYSICAL EXAM:  Blood pressure (!) 144/69, pulse 77, temperature 98.7 F (37.1 C), resp. rate 16, height 6' (1.829 m), weight 188 lb 3.2 oz (85.4 kg), SpO2 97 %. Wt Readings from Last 3 Encounters:  09/15/20 188 lb 3.2 oz (85.4 kg)  08/01/20 186 lb (84.4 kg)  06/02/20 189 lb (85.7 kg)   Body mass index is 25.52 kg/m. Performance status (ECOG): 0 Physical Exam Constitutional:      Appearance: Normal appearance. He is not ill-appearing.  HENT:     Mouth/Throat:     Mouth: Mucous membranes are moist.     Pharynx: Oropharynx is clear. No oropharyngeal exudate or posterior oropharyngeal erythema.  Cardiovascular:     Rate and Rhythm: Normal rate and regular rhythm.     Heart sounds: No murmur heard.   No friction rub. No gallop.  Pulmonary:     Effort: Pulmonary effort is normal. No respiratory distress.     Breath sounds: Normal breath sounds. No wheezing, rhonchi or rales.  Chest:  Breasts:    Right: No axillary  adenopathy or supraclavicular adenopathy.     Left: No axillary adenopathy or supraclavicular adenopathy.  Abdominal:     General: Bowel sounds are normal. There is no distension.     Palpations: Abdomen is soft. There is no mass.     Tenderness: There is no abdominal tenderness.  Musculoskeletal:        General: No swelling.     Right lower leg: No edema.     Left lower leg: No edema.  Lymphadenopathy:     Cervical: No cervical adenopathy.     Upper Body:     Right upper body: No supraclavicular or axillary adenopathy.     Left upper body: No supraclavicular or axillary adenopathy.     Lower Body: No right inguinal adenopathy. No left inguinal adenopathy.  Skin:    General: Skin is warm.     Coloration: Skin is not jaundiced.     Findings: No lesion or rash.  Neurological:     General: No focal deficit present.     Mental Status: He is alert and oriented to person, place, and time. Mental status is at baseline.     Cranial Nerves: Cranial nerves are intact.  Psychiatric:        Mood and Affect: Mood normal.        Behavior: Behavior normal.        Thought Content: Thought content normal.  LABS:   CBC Latest Ref Rng & Units 09/15/2020 03/17/2020 04/11/2019  WBC - 5.5 6.3 7.6  Hemoglobin 13.5 - 17.5 11.9(A) 11.9(A) 11.0(L)  Hematocrit 41 - 53 35(A) 36(A) 32.6(L)  Platelets 150 - 399 137(A) 125(A) 209   CMP Latest Ref Rng & Units 09/15/2020 03/17/2020 04/11/2019  Glucose 70 - 99 mg/dL - - 120(H)  BUN 4 - 21 14 21  44(H)  Creatinine 0.6 - 1.3 1.0 1.2 0.95  Sodium 137 - 147 137 139 135  Potassium 3.4 - 5.3 4.7 4.5 4.2  Chloride 99 - 108 104 103 103  CO2 13 - 22 26(A) 26(A) 23  Calcium 8.7 - 10.7 9.0 9.6 8.7(L)  Total Protein 6.5 - 8.1 g/dL - - 5.7(L)  Total Bilirubin 0.3 - 1.2 mg/dL - - 0.4  Alkaline Phos 25 - 125 79 63 49  AST 14 - 40 24 22 49(H)  ALT 10 - 40 24 15 84(H)    ASSESSMENT & PLAN:  Assessment/Plan:  A 87 y.o. male with mild leukopenia and anemia.  When  evaluating his labs today, his white cell and hemoglobin levels continue to hold stable.  He still has increased monocytes, which suggests there could be underlying myelodysplasia or the very early stages of chronic myelomonocytic leukemia.  However, as his counts remain fine and he is clinically doing well,  I will continue to follow him conservatively.  I will see him back in 6 months for repeat clinical assessment.  The patient understands all the plans discussed today and is in agreement with them.     I, Rita Ohara, am acting as scribe for [name], MD    I have reviewed this report as typed by the medical scribe, and it is complete and accurate.  [name], MD

## 2020-09-13 ENCOUNTER — Telehealth: Payer: Self-pay | Admitting: Oncology

## 2020-09-13 NOTE — Telephone Encounter (Signed)
Patient called to verify 6/17 Appt's

## 2020-09-15 ENCOUNTER — Telehealth: Payer: Self-pay | Admitting: Oncology

## 2020-09-15 ENCOUNTER — Other Ambulatory Visit: Payer: Self-pay | Admitting: Oncology

## 2020-09-15 ENCOUNTER — Other Ambulatory Visit: Payer: Self-pay

## 2020-09-15 ENCOUNTER — Inpatient Hospital Stay: Payer: Medicare Other

## 2020-09-15 ENCOUNTER — Encounter: Payer: Self-pay | Admitting: Oncology

## 2020-09-15 ENCOUNTER — Inpatient Hospital Stay: Payer: Medicare Other | Attending: Oncology | Admitting: Oncology

## 2020-09-15 VITALS — BP 144/69 | HR 77 | Temp 98.7°F | Resp 16 | Ht 72.0 in | Wt 188.2 lb

## 2020-09-15 DIAGNOSIS — D649 Anemia, unspecified: Secondary | ICD-10-CM

## 2020-09-15 DIAGNOSIS — D72819 Decreased white blood cell count, unspecified: Secondary | ICD-10-CM

## 2020-09-15 LAB — BASIC METABOLIC PANEL
BUN: 14 (ref 4–21)
CO2: 26 — AB (ref 13–22)
Chloride: 104 (ref 99–108)
Creatinine: 1 (ref 0.6–1.3)
Glucose: 96
Potassium: 4.7 (ref 3.4–5.3)
Sodium: 137 (ref 137–147)

## 2020-09-15 LAB — HEPATIC FUNCTION PANEL
ALT: 24 (ref 10–40)
AST: 24 (ref 14–40)
Alkaline Phosphatase: 79 (ref 25–125)
Bilirubin, Total: 0.4

## 2020-09-15 LAB — CBC AND DIFFERENTIAL
HCT: 35 — AB (ref 41–53)
Hemoglobin: 11.9 — AB (ref 13.5–17.5)
Neutrophils Absolute: 3.47
Platelets: 137 — AB (ref 150–399)
WBC: 5.5

## 2020-09-15 LAB — CBC: RBC: 3.76 — AB (ref 3.87–5.11)

## 2020-09-15 LAB — COMPREHENSIVE METABOLIC PANEL
Albumin: 4.1 (ref 3.5–5.0)
Calcium: 9 (ref 8.7–10.7)

## 2020-09-15 NOTE — Telephone Encounter (Signed)
Per 6/17 los next appt scheduled and given to patient

## 2020-09-21 ENCOUNTER — Other Ambulatory Visit: Payer: Self-pay

## 2020-09-25 ENCOUNTER — Ambulatory Visit: Payer: Medicare Other | Admitting: Cardiology

## 2020-09-25 ENCOUNTER — Other Ambulatory Visit: Payer: Self-pay

## 2020-09-25 ENCOUNTER — Encounter: Payer: Self-pay | Admitting: Cardiology

## 2020-09-25 VITALS — BP 136/72 | HR 76 | Ht 71.0 in | Wt 189.2 lb

## 2020-09-25 DIAGNOSIS — I493 Ventricular premature depolarization: Secondary | ICD-10-CM

## 2020-09-25 DIAGNOSIS — E782 Mixed hyperlipidemia: Secondary | ICD-10-CM | POA: Diagnosis not present

## 2020-09-25 DIAGNOSIS — Z79899 Other long term (current) drug therapy: Secondary | ICD-10-CM | POA: Diagnosis not present

## 2020-09-25 HISTORY — DX: Ventricular premature depolarization: I49.3

## 2020-09-25 MED ORDER — AMIODARONE HCL 200 MG PO TABS: ORAL_TABLET | ORAL | 3 refills | Status: DC

## 2020-09-25 NOTE — Patient Instructions (Signed)
Medication Instructions:   Your physician has recommended you make the following change in your medication:  START: Amiodarone 200 mg twice daily for 7 days then 200 mg once daily.  *If you need a refill on your cardiac medications before your next appointment, please call your pharmacy*   Lab Work:  Your physician recommends that you return for lab work in:  TODAY: BMET, Mag, TSH, LFTs  If you have labs (blood work) drawn today and your tests are completely normal, you will receive your results only by: [place] (if you have [place]) OR A paper copy in the mail If you have any lab test that is abnormal or we need to change your treatment, we will call you to review the results.   Testing/Procedures:  None   Follow-Up: At [facility], you and your health needs are our priority.  As part of our continuing mission to provide you with exceptional heart care, we have created designated Provider Care Teams.  These Care Teams include your primary Cardiologist (physician) and Advanced Practice Providers (APPs -  Physician Assistants and Nurse Practitioners) who all work together to provide you with the care you need, when you need it.  We recommend signing up for the patient portal called "MyChart".  Sign up information is provided on this After Visit Summary.  MyChart is used to connect with patients for Virtual Visits (Telemedicine).  Patients are able to view lab/test results, encounter notes, upcoming appointments, etc.  Non-urgent messages can be sent to your provider as well.   To learn more about what you can do with MyChart, go to [email or web address].    Your next appointment:   6 week(s)  The format for your next appointment:   In Person  Provider:   Berniece Salines, DO   Other Instructions

## 2020-09-25 NOTE — Progress Notes (Signed)
Cardiology Office Note:    Date:  09/25/2020   ID:  Jimmy Grant., DOB 1925-01-28, MRN [id number]  PCP:  Nicoletta Dress, MD  Cardiologist:  Berniece Salines, DO  Electrophysiologist:  None   Referring MD: Nicoletta Dress, MD   No chief complaint on file. I think the PVCs are getting worse  History of Present Illness:    Jimmy Grant. is a 87 y.o. male with a hx of of  hyperlipidemia, hypertension, history of PVCs is here today to be evaluated for palpitations.  Patient tells me that this has been going on for 10 to 15 years.  Initially he was followed by Dr. Bettina Gavia he was started on acebutolol.  He has some relief.  Over the last several several months he has had worsening palpitations even on acebutolol.  He ended up in emergency department at [facility] where he was told that he had frequent PVCs.   I saw the patient initially on June 02, 2020 at that time he was post hospitalization where he had frequent PVCs and a monitor had been placed on him.  Given he was experiencing significant PVCs on his acebutolol has stopped the medication started the patient on Cardizem 120 mg daily.  In the interim his monitor had come back showing frequent PVCs.  At his visit on Aug 01, 2020 we will increase his Cardizem to 240 mg daily.  He tells me that the medicine seem to work for a while but he has gone back to frequent PVCs and is really disturbing.  No other complaints at this time.  Past Medical History:  Diagnosis Date   'light-for-dates' infant with signs of fetal malnutrition 08/19/2016   Acute respiratory disease due to COVID-19 virus 04/07/2019   Anemia 10/17/2017   Branch retinal vein occlusion    Cardiac arrhythmia 07/23/2016   Choroidal nevus, right 11/13/2017   Dyslipidemia    Edema of both legs    Glaucoma    History of prostate cancer    Hypercholesterolemia 06/08/2018   Hypertension 07/23/2016   Iritis, recurrent, bilateral 12/09/2013   Late effects of  CVA (cerebrovascular accident)    Leukopenia 10/17/2017   Lumbar disc narrowing    Macular edema 07/16/2012   Malignant neoplasm of prostate ([place]) 03/17/2020   Mixed hyperlipidemia 06/02/2020   OSA on CPAP    Osteoarthritis of right glenohumeral joint    PAC (premature atrial contraction) 06/02/2020   Primary hypertension 06/02/2020   Primary open angle glaucoma (POAG) of both eyes, moderate stage 07/16/2012   Prostate cancer ([place])    PSVT (paroxysmal supraventricular tachycardia) ([place]) 08/12/2005   PVC (premature ventricular contraction) 06/02/2020   Status post intraocular lens implant 07/16/2012   Thyroid nodule    Vitamin B12 deficiency     Past Surgical History:  Procedure Laterality Date   ADENOIDECTOMY  1931   CATARACT EXTRACTION Bilateral    Left on 05/04/96, Right on 05/08/1993   HEMORROIDECTOMY  11/21/2014   Done by Kendell Bane   PROSTATECTOMY  01/11/1999   Transurethral   TEAR DUCT PROBING Right 07/2018   Tear duct surgery / Dr. Lorina Rabon   VIDEO ASSISTED THORACOSCOPY (VATS)/DECORTICATION  2012    Current Medications: Current Meds  Medication Sig   aspirin EC 81 MG tablet Take 81 mg by mouth daily.   Azelastine HCl 0.15 % SOLN Place 2 sprays into both nostrils daily.   calcium-vitamin D (OSCAL WITH D) 500-200 MG-UNIT tablet Take 1 tablet  by mouth daily.   DILT-XR 240 MG 24 hr capsule Take 240 mg by mouth daily.   diltiazem (CARDIZEM CD) 240 MG 24 hr capsule Take 1 capsule (240 mg total) by mouth daily.   DIOVAN 160 MG tablet Take 160 mg by mouth 2 (two) times daily.    dorzolamide-timolol (COSOPT) 22.3-6.8 MG/ML ophthalmic solution Place 1 drop into both eyes 2 (two) times daily.   Glucosamine-Chondroitin (GLUCOSAMINE CHONDR COMPLEX PO) Take 1 tablet by mouth daily.   Multiple Vitamins-Minerals (PRESERVISION AREDS 2+MULTI VIT) CAPS Take 1 capsule by mouth 2 (two) times daily.   simvastatin (ZOCOR) 20 MG tablet Take 20 mg by mouth every evening.   Vitamin D, Ergocalciferol,  (DRISDOL) 1.25 MG (50000 UNIT) CAPS capsule Take 1 capsule (50,000 Units total) by mouth every 7 (seven) days.     Allergies:   Patient has no known allergies.   Social History   Socioeconomic History   Marital status: Married    Spouse name: Not on file   Number of children: Not on file   Years of education: Not on file   Highest education level: Not on file  Occupational History   Occupation: retired  Tobacco Use   Smoking status: Former    Packs/day: 1.00    Years: 40.00    Pack years: 40.00    Types: Cigarettes    Quit date: 1971    Years since quitting: 51.5   Smokeless tobacco: Never  Substance and Sexual Activity   Alcohol use: Yes    Alcohol/week: 7.0 standard drinks    Types: 7 Glasses of wine per week   Drug use: Never   Sexual activity: Not on file  Other Topics Concern   Not on file  Social History Narrative   Not on file   Social Determinants of Health   Financial Resource Strain: Not on file  Food Insecurity: Not on file  Transportation Needs: Not on file  Physical Activity: Not on file  Stress: Not on file  Social Connections: Not on file     Family History: The patient's family history includes Diabetes in his mother; Hypertension in his mother; Kidney disease in his father.  ROS:   Review of Systems  Constitution: Negative for decreased appetite, fever and weight gain.  HENT: Negative for congestion, ear discharge, hoarse voice and sore throat.   Eyes: Negative for discharge, redness, vision loss in right eye and visual halos.  Cardiovascular: Negative for chest pain, dyspnea on exertion, leg swelling, orthopnea and palpitations.  Respiratory: Negative for cough, hemoptysis, shortness of breath and snoring.   Endocrine: Negative for heat intolerance and polyphagia.  Hematologic/Lymphatic: Negative for bleeding problem. Does not bruise/bleed easily.  Skin: Negative for flushing, nail changes, rash and suspicious lesions.  Musculoskeletal:  Negative for arthritis, joint pain, muscle cramps, myalgias, neck pain and stiffness.  Gastrointestinal: Negative for abdominal pain, bowel incontinence, diarrhea and excessive appetite.  Genitourinary: Negative for decreased libido, genital sores and incomplete emptying.  Neurological: Negative for brief paralysis, focal weakness, headaches and loss of balance.  Psychiatric/Behavioral: Negative for altered mental status, depression and suicidal ideas.  Allergic/Immunologic: Negative for HIV exposure and persistent infections.    EKGs/Labs/Other Studies Reviewed:    The following studies were reviewed today:   EKG: None today  July 03, 2020 IMPRESSIONS   1. Frequent PVCs during the study.   2. Left ventricular ejection fraction, by estimation, is 55 to 60%. The  left ventricle has normal function. The left  ventricle has no regional  wall motion abnormalities. There is mild concentric left ventricular  hypertrophy. Left ventricular diastolic  parameters are consistent with Grade I diastolic dysfunction (impaired  relaxation). The average left ventricular global longitudinal strain is  -8.3 %. The global longitudinal strain is abnormal.   3. Right ventricular systolic function is normal. The right ventricular  size is normal. There is normal pulmonary artery systolic pressure.   4. The mitral valve is normal in structure. No evidence of mitral valve  regurgitation. No evidence of mitral stenosis.   5. The aortic valve is normal in structure. Aortic valve regurgitation is  not visualized. No aortic stenosis is present.   6. Abdominal aorta mildly dilalated (2.3 cm).   7. No pericardial effusion.   FINDINGS   Left Ventricle: Left ventricular ejection fraction, by estimation, is 55  to 60%. The left ventricle has normal function. The left ventricle has no  regional wall motion abnormalities. The average left ventricular global  longitudinal strain is -8.3 %. The   global  longitudinal strain is abnormal. The left ventricular internal  cavity size was normal in size. There is mild concentric left ventricular  hypertrophy. Left ventricular diastolic parameters are consistent with  Grade I diastolic dysfunction  (impaired relaxation).   Right Ventricle: The right ventricular size is normal. No increase in  right ventricular wall thickness. Right ventricular systolic function is  normal. There is normal pulmonary artery systolic pressure. The tricuspid  regurgitant velocity is 2.55 m/s, and   with an assumed right atrial pressure of 3 mmHg, the estimated right  ventricular systolic pressure is 30.0 mmHg.   Left Atrium: Left atrial size was normal in size.   Right Atrium: Right atrial size was normal in size.   Pericardium: There is no evidence of pericardial effusion. Presence of  pericardial fat pad.   Mitral Valve: The mitral valve is normal in structure. No evidence of  mitral valve regurgitation. No evidence of mitral valve stenosis.   Tricuspid Valve: The tricuspid valve is normal in structure. Tricuspid  valve regurgitation is not demonstrated. No evidence of tricuspid  stenosis.   Aortic Valve: The aortic valve is normal in structure. Aortic valve  regurgitation is not visualized. No aortic stenosis is present.   Pulmonic Valve: The pulmonic valve was normal in structure. Pulmonic valve  regurgitation is not visualized. No evidence of pulmonic stenosis.   Aorta: Abdominal aorta mildly dilalated (2.3 cm). The aortic root is  normal in size and structure.   Venous: The inferior vena cava is normal in size with greater than 50%  respiratory variability, suggesting right atrial pressure of 3 mmHg.   IAS/Shunts: No atrial level shunt detected by color flow Doppler.       ZIO monitor The patient wore the monitor for 13 days 19 hours starting May 22, 2020 Indication: Palpitations   The minimum heart rate was 32 bpm, maximum heart rate  was 165 bpm, and average heart rate was 71 bpm. Predominant underlying rhythm was Sinus Rhythm. Second Degree AV Block-Mobitz I (Wenckebach) was present     Premature atrial complexes were rare less than 1%. Premature Ventricular complexes where frequent (9.4%, [id number]).   No pauses, No AV block and no atrial fibrillation present. No patient triggered events or diary events were noted   Conclusion: This study is remarkable for the following:  1.  Frequent premature ventricular complex.                                  2.  Single episode of type I second-degree AV block (Wenckebach)-this is nonpathological.    Recent Labs: 09/15/2020: ALT 24; BUN 14; Creatinine 1.0; Hemoglobin 11.9; Platelets 137; Potassium 4.7; Sodium 137  Recent Lipid Panel    Component Value Date/Time   TRIG 124 04/07/2019 1039    Physical Exam:    VS:  BP 136/72   Pulse 76   Ht 5\' 11"  (1.803 m)   Wt 189 lb 3.2 oz (85.8 kg)   SpO2 97%   BMI 26.39 kg/m     Wt Readings from Last 3 Encounters:  09/25/20 189 lb 3.2 oz (85.8 kg)  09/15/20 188 lb 3.2 oz (85.4 kg)  08/01/20 186 lb (84.4 kg)     GEN: Well nourished, well developed in no acute distress HEENT: Normal NECK: No JVD; No carotid bruits LYMPHATICS: No lymphadenopathy CARDIAC: S1S2 noted,RRR, no murmurs, rubs, gallops RESPIRATORY:  Clear to auscultation without rales, wheezing or rhonchi  ABDOMEN: Soft, non-tender, non-distended, +bowel sounds, no guarding. EXTREMITIES: No edema, No cyanosis, no clubbing MUSCULOSKELETAL:  No deformity  SKIN: Warm and dry NEUROLOGIC:  Alert and oriented x 3, non-focal PSYCHIATRIC:  Normal affect, good insight  ASSESSMENT:    1. PVC (premature ventricular contraction)   2. Mixed hyperlipidemia   3. Frequent PVCs    PLAN:     We will keep the patient on his current Cardizem dose and add amiodarone 200 mg daily.  He will start with 200 mg twice a day for 7 days and then down to  200 mg daily.  Hoping this will help with his symptoms.  Blood pressure is acceptable, continue with current antihypertensive regimen.  The patient is in agreement with the above plan. The patient left the office in stable condition.  The patient will follow up in 6 weeks.   Medication Adjustments/Labs and Tests Ordered: Current medicines are reviewed at length with the patient today.  Concerns regarding medicines are outlined above.  No orders of the defined types were placed in this encounter.  No orders of the defined types were placed in this encounter.   There are no Patient Instructions on file for this visit.   Adopting a Healthy Lifestyle.  Know what a healthy weight is for you (roughly BMI <25) and aim to maintain this   Aim for 7+ servings of fruits and vegetables daily   65-80+ fluid ounces of water or unsweet tea for healthy kidneys   Limit to max 1 drink of alcohol per day; avoid smoking/tobacco   Limit animal fats in diet for cholesterol and heart health - choose grass fed whenever available   Avoid highly processed foods, and foods high in saturated/trans fats   Aim for low stress - take time to unwind and care for your mental health   Aim for 150 min of moderate intensity exercise weekly for heart health, and weights twice weekly for bone health   Aim for 7-9 hours of sleep daily   When it comes to diets, agreement about the perfect plan isnt easy to find, even among the experts. Experts at the [place] developed an idea known as the Healthy Eating Plate. Just imagine a plate divided into logical, healthy portions.   The emphasis is on  diet quality:   Load up on vegetables and fruits - one-half of your plate: Aim for color and variety, and remember that potatoes dont count.   Go for whole grains - one-quarter of your plate: Whole wheat, barley, wheat berries, quinoa, oats, brown rice, and foods made with them. If you want pasta, go  with whole wheat pasta.   Protein power - one-quarter of your plate: Fish, chicken, beans, and nuts are all healthy, versatile protein sources. Limit red meat.   The diet, however, does go beyond the plate, offering a few other suggestions.   Use healthy plant oils, such as olive, canola, soy, corn, sunflower and peanut. Check the labels, and avoid partially hydrogenated oil, which have unhealthy trans fats.   If youre thirsty, drink water. Coffee and tea are good in moderation, but skip sugary drinks and limit milk and dairy products to one or two daily servings.   The type of carbohydrate in the diet is more important than the amount. Some sources of carbohydrates, such as vegetables, fruits, whole grains, and beans-are healthier than others.   Finally, stay active  Signed, Berniece Salines, DO  09/25/2020 2:38 PM    [facility]

## 2020-09-26 LAB — BASIC METABOLIC PANEL
BUN/Creatinine Ratio: 16 (ref 10–24)
BUN: 17 mg/dL (ref 10–36)
CO2: 21 mmol/L (ref 20–29)
Calcium: 9.3 mg/dL (ref 8.6–10.2)
Chloride: 104 mmol/L (ref 96–106)
Creatinine, Ser: 1.07 mg/dL (ref 0.76–1.27)
Glucose: 88 mg/dL (ref 65–99)
Potassium: 4.6 mmol/L (ref 3.5–5.2)
Sodium: 138 mmol/L (ref 134–144)
eGFR: 64 mL/min/{1.73_m2} (ref >59)

## 2020-09-26 LAB — HEPATIC FUNCTION PANEL
ALT: 14 IU/L (ref 0–44)
AST: 18 IU/L (ref 0–40)
Albumin: 4.3 g/dL (ref 3.5–4.6)
Alkaline Phosphatase: 89 IU/L (ref 44–121)
Bilirubin Total: 0.3 mg/dL (ref 0.0–1.2)
Bilirubin, Direct: <0.1 mg/dL (ref 0.00–0.40)
Total Protein: 6.1 g/dL (ref 6.0–8.5)

## 2020-09-26 LAB — MAGNESIUM: Magnesium: 2.2 mg/dL (ref 1.6–2.3)

## 2020-09-26 LAB — TSH: TSH: 2.79 u[IU]/mL (ref 0.450–4.500)

## 2020-09-26 IMAGING — DX DG CHEST 1V PORT
1 series · 1 of 1 positions shown · non-contrast
Comparison: 04/03/2019

CLINICAL DATA: Shortness of breath.  COVID positive

EXAM:
PORTABLE CHEST 1 VIEW

[chest]
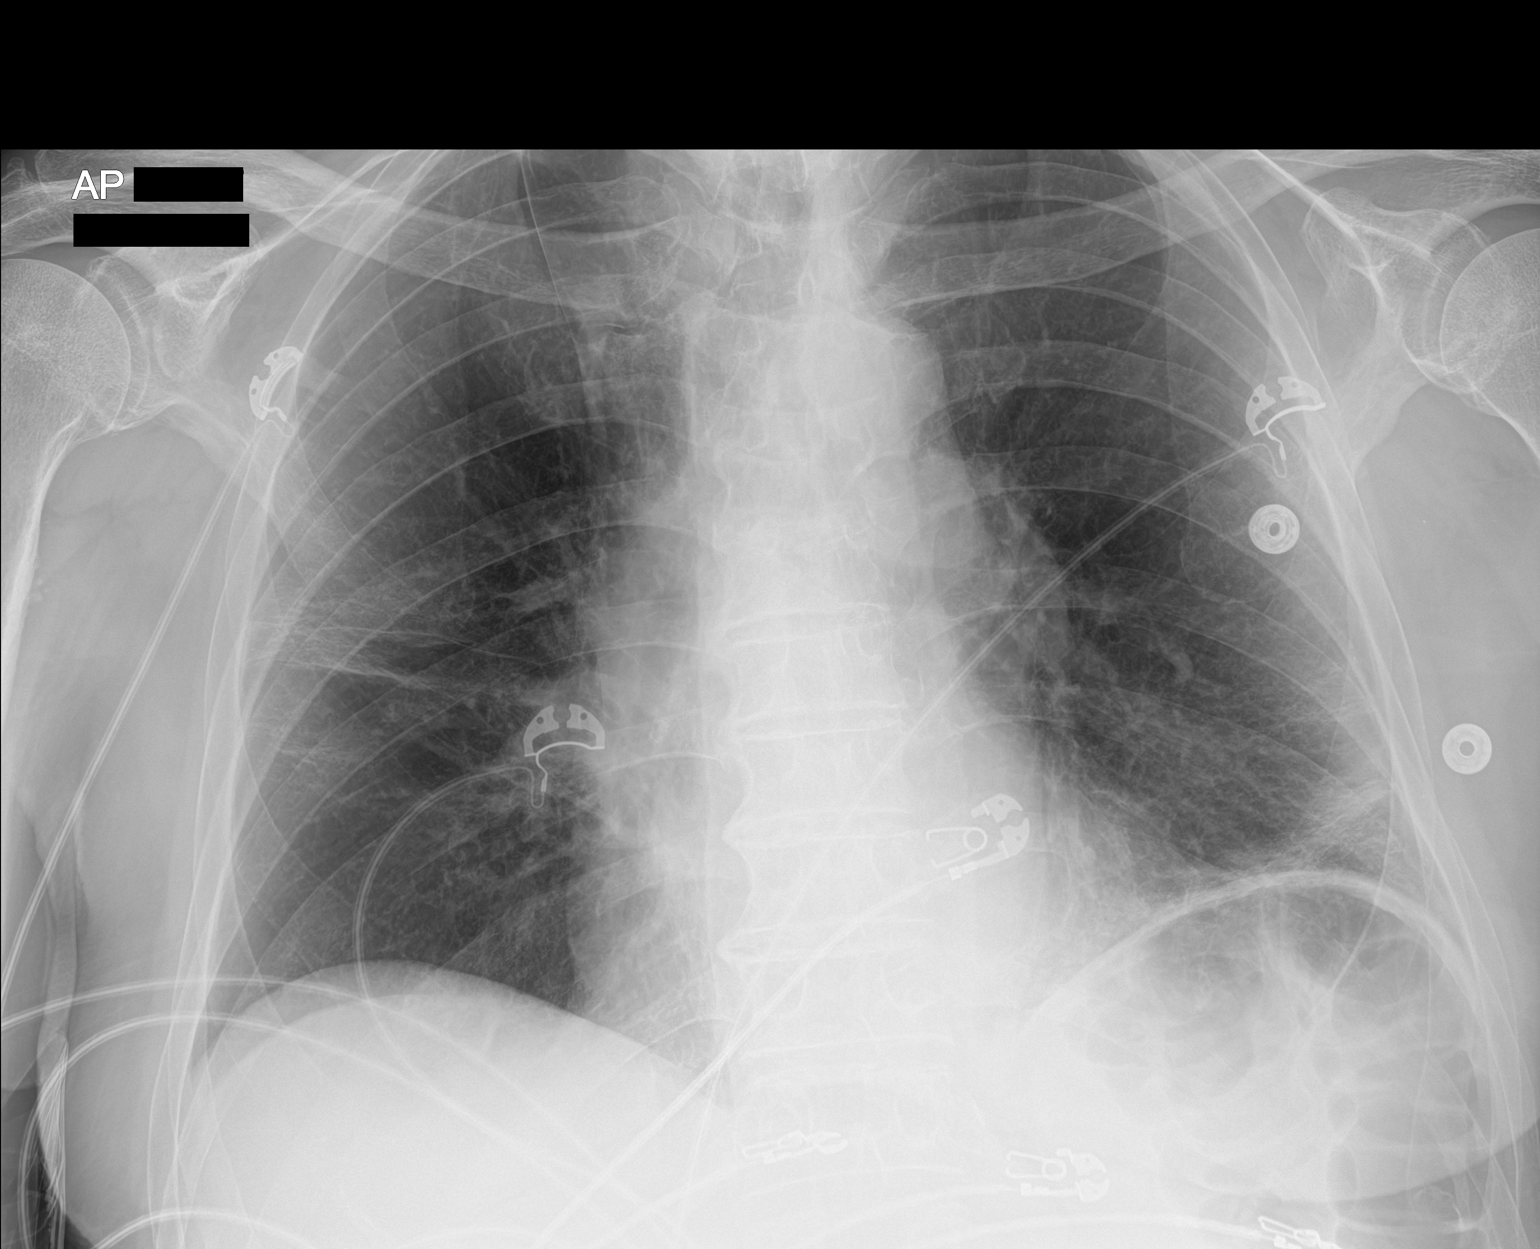

[1 of 1 positions shown; findings below may reference images not displayed]

FINDINGS: New patchy streaky opacities noted at the left lung base and in the
right mid lung. Findings concerning for early infiltrate/pneumonia.
Heart is normal size. No effusions or acute bony abnormality.
IMPRESSION: Streaky opacities at the left lung base and the right mid lung
concerning for early infiltrates/pneumonia.

## 2020-10-09 DIAGNOSIS — D519 Vitamin B12 deficiency anemia, unspecified: Secondary | ICD-10-CM | POA: Diagnosis not present

## 2020-10-12 ENCOUNTER — Telehealth: Payer: Self-pay | Admitting: Cardiology

## 2020-10-12 NOTE — Telephone Encounter (Signed)
Patient c/o Palpitations:  High priority if patient c/o lightheadedness, shortness of breath, or chest pain  How long have you had palpitations/irregular HR/ Afib? Are you having the symptoms now? Past 3 months has been worsening over past 2 weeks since medication add-on's, not having symptoms now.   Are you currently experiencing lightheadedness, SOB or CP? No, but states he feels sinking feeling in his chest like possible SOB  Do you have a history of afib (atrial fibrillation) or irregular heart rhythm? Yes  Have you checked your BP or HR? (document readings if available): Has not checked BP, but thinks it is okay.  HR 72 at 8:45 AM  Are you experiencing any other symptoms? No   Pt c/o medication issue:  1. Name of Medication:  diltiazem (CARDIZEM CD) 240 MG 24 hr capsule  2. How are you currently taking this medication (dosage and times per day)? As prescribed, except for last night took it at 12 AM due to palps being so bad.   3. Are you having a reaction (difficulty breathing--STAT)? No   4. What is your medication issue? Jimmy Grant is calling stating he takes this medication at 6 AM every morning. He feels that he needs an increase in dosage or needs to double up on medication daily. Due to him having bad nights as if the medication has worn off. Last night his palpitations were so severe he would have 1 good beat then PVC. Due to this he took this medication at 12 AM. At 6 AM when he woke up he was still having 1 good beat then PVC, 1 good beat then PVC and then 12 good beats. He reports after getting up and moving around everything seemed to calm down and go back to normal. Please advise.

## 2020-10-12 NOTE — Telephone Encounter (Signed)
At his last visit I started him on amiodarone please find out if the patient is taking his medication.

## 2020-10-13 ENCOUNTER — Other Ambulatory Visit: Payer: Self-pay

## 2020-10-13 ENCOUNTER — Telehealth: Payer: Self-pay

## 2020-10-13 MED ORDER — AMIODARONE HCL 200 MG PO TABS
200.0000 mg | ORAL_TABLET | Freq: Two times a day (BID) | ORAL | 3 refills | Status: DC
Start: 2020-10-13 — End: 2021-05-31

## 2020-10-13 NOTE — Progress Notes (Signed)
Prescription sent to pharmacy.

## 2020-10-13 NOTE — Telephone Encounter (Signed)
Called patient to let know Dr. Harriet Masson wants to increase his Amiodarone to twice daily. He is to reach back out in 2-3 weeks to let us know how is he feeling. He verbalized understanding and said he will reach back out. He thanked me for calling him back before the weekend.

## 2020-10-13 NOTE — Telephone Encounter (Signed)
Called patient he states he has taken his medications as prescribed and but he has bene taking them early the past three for days and it has worked out [employer]. Took at midnight the night before by 6 am my heart settled. 6 pm yesterday I felt my heart starting up again so at 8:30 I took both medications. Last night was wonderful. I feel good this morning. He states "it will run out about dinner time tonight. Can I take it twice daily? Or a great concentration?" Will relay message to Dr. Harriet Masson and get back to patient.

## 2020-10-30 DIAGNOSIS — I10 Essential (primary) hypertension: Secondary | ICD-10-CM | POA: Diagnosis not present

## 2020-10-30 DIAGNOSIS — E785 Hyperlipidemia, unspecified: Secondary | ICD-10-CM | POA: Diagnosis not present

## 2020-11-13 ENCOUNTER — Encounter: Payer: Self-pay | Admitting: Cardiology

## 2020-11-13 ENCOUNTER — Other Ambulatory Visit: Payer: Self-pay

## 2020-11-13 ENCOUNTER — Ambulatory Visit: Payer: Medicare Other | Admitting: Cardiology

## 2020-11-13 VITALS — BP 150/70 | HR 68 | Ht 71.5 in | Wt 189.1 lb

## 2020-11-13 DIAGNOSIS — I493 Ventricular premature depolarization: Secondary | ICD-10-CM | POA: Diagnosis not present

## 2020-11-13 DIAGNOSIS — I10 Essential (primary) hypertension: Secondary | ICD-10-CM | POA: Diagnosis not present

## 2020-11-13 NOTE — Progress Notes (Signed)
Cardiology Office Note:    Date:  11/13/2020   ID:  Jimmy Lucks., DOB September 01, 1924, MRN [id number]  PCP:  Nicoletta Dress, MD  Cardiologist:  Berniece Salines, DO  Electrophysiologist:  None   Referring MD: Nicoletta Dress, MD   I am doing a lot better  History of Present Illness:    Jimmy Whittum. is a 87 y.o. male with a hx of  hyperlipidemia, hypertension, history of PVCs is here today to be evaluated for palpitations.  Patient tells me that this has been going on for 10 to 15 years.  Initially he was followed by Dr. Bettina Grant he was started on acebutolol.  He has some relief.  Over the last several several months he has had worsening palpitations even on acebutolol.  He ended up in emergency department at [facility] where he was told that he had frequent PVCs.   I saw the patient initially on June 02, 2020 at that time he was post hospitalization where he had frequent PVCs and a monitor had been placed on him.  Given he was experiencing significant PVCs on his acebutolol has stopped the medication started the patient on Cardizem 120 mg daily.  In the interim his monitor had come back showing frequent PVCs.  At his visit on Aug 01, 2020 we will increase his Cardizem to 240 mg daily.  He tells me that the medicine seem to work for a while but he has gone back to frequent PVCs and is really disturbing.  At his visit on September 25, 2020 we kept him on the same dose of Cardizem and added amiodarone to the patient medication regimen. In the interim I have had to increase his amiodarone twice daily.  He tells me since that change he has had difficult improvement and have had no symptoms from the PVCs.  He is very happy with the improvement.  Past Medical History:  Diagnosis Date   'light-for-dates' infant with signs of fetal malnutrition 08/19/2016   Acute respiratory disease due to COVID-19 virus 04/07/2019   Anemia 10/17/2017   Branch retinal vein occlusion     Cardiac arrhythmia 07/23/2016   Choroidal nevus, right 11/13/2017   Dyslipidemia    Edema of both legs    Glaucoma    History of prostate cancer    Hypercholesterolemia 06/08/2018   Hypertension 07/23/2016   Iritis, recurrent, bilateral 12/09/2013   Late effects of CVA (cerebrovascular accident)    Leukopenia 10/17/2017   Lumbar disc narrowing    Macular edema 07/16/2012   Malignant neoplasm of prostate ([place]) 03/17/2020   Mixed hyperlipidemia 06/02/2020   OSA on CPAP    Osteoarthritis of right glenohumeral joint    PAC (premature atrial contraction) 06/02/2020   Primary hypertension 06/02/2020   Primary open angle glaucoma (POAG) of both eyes, moderate stage 07/16/2012   Prostate cancer ([place])    PSVT (paroxysmal supraventricular tachycardia) ([place]) 08/12/2005   PVC (premature ventricular contraction) 06/02/2020   Status post intraocular lens implant 07/16/2012   Thyroid nodule    Vitamin B12 deficiency     Past Surgical History:  Procedure Laterality Date   ADENOIDECTOMY  1931   CATARACT EXTRACTION Bilateral    Left on 05/04/96, Right on 05/08/1993   HEMORROIDECTOMY  11/21/2014   Done by Kendell Bane   PROSTATECTOMY  01/11/1999   Transurethral   TEAR DUCT PROBING Right 07/2018   Tear duct surgery / Dr. Lorina Rabon   VIDEO ASSISTED THORACOSCOPY (VATS)/DECORTICATION  2012    Current Medications: Current Meds  Medication Sig   amiodarone (PACERONE) 200 MG tablet Take 1 tablet (200 mg total) by mouth 2 (two) times daily.   aspirin EC 81 MG tablet Take 81 mg by mouth daily.   Azelastine HCl 0.15 % SOLN Place 2 sprays into both nostrils daily.   calcium-vitamin D (OSCAL WITH D) 500-200 MG-UNIT tablet Take 1 tablet by mouth daily.   diltiazem (CARDIZEM CD) 240 MG 24 hr capsule Take 1 capsule (240 mg total) by mouth daily.   DIOVAN 160 MG tablet Take 160 mg by mouth 2 (two) times daily.    dorzolamide-timolol (COSOPT) 22.3-6.8 MG/ML ophthalmic solution Place 1 drop into both eyes 2 (two) times  daily.   Glucosamine-Chondroitin (GLUCOSAMINE CHONDR COMPLEX PO) Take 1 tablet by mouth daily.   Multiple Vitamins-Minerals (PRESERVISION AREDS 2+MULTI VIT) CAPS Take 1 capsule by mouth 2 (two) times daily.   simvastatin (ZOCOR) 20 MG tablet Take 20 mg by mouth every evening.     Allergies:   Patient has no known allergies.   Social History   Socioeconomic History   Marital status: Married    Spouse name: Not on file   Number of children: Not on file   Years of education: Not on file   Highest education level: Not on file  Occupational History   Occupation: retired  Tobacco Use   Smoking status: Former    Packs/day: 1.00    Years: 40.00    Pack years: 40.00    Types: Cigarettes    Quit date: 1971    Years since quitting: 51.6   Smokeless tobacco: Never  Substance and Sexual Activity   Alcohol use: Yes    Alcohol/week: 7.0 standard drinks    Types: 7 Glasses of wine per week   Drug use: Never   Sexual activity: Not on file  Other Topics Concern   Not on file  Social History Narrative   Not on file   Social Determinants of Health   Financial Resource Strain: Not on file  Food Insecurity: Not on file  Transportation Needs: Not on file  Physical Activity: Not on file  Stress: Not on file  Social Connections: Not on file     Family History: The patient's family history includes Diabetes in his mother; Hypertension in his mother; Kidney disease in his father.  ROS:   Review of Systems  Constitution: Negative for decreased appetite, fever and weight gain.  HENT: Negative for congestion, ear discharge, hoarse voice and sore throat.   Eyes: Negative for discharge, redness, vision loss in right eye and visual halos.  Cardiovascular: Negative for chest pain, dyspnea on exertion, leg swelling, orthopnea and palpitations.  Respiratory: Negative for cough, hemoptysis, shortness of breath and snoring.   Endocrine: Negative for heat intolerance and polyphagia.   Hematologic/Lymphatic: Negative for bleeding problem. Does not bruise/bleed easily.  Skin: Negative for flushing, nail changes, rash and suspicious lesions.  Musculoskeletal: Negative for arthritis, joint pain, muscle cramps, myalgias, neck pain and stiffness.  Gastrointestinal: Negative for abdominal pain, bowel incontinence, diarrhea and excessive appetite.  Genitourinary: Negative for decreased libido, genital sores and incomplete emptying.  Neurological: Negative for brief paralysis, focal weakness, headaches and loss of balance.  Psychiatric/Behavioral: Negative for altered mental status, depression and suicidal ideas.  Allergic/Immunologic: Negative for HIV exposure and persistent infections.    EKGs/Labs/Other Studies Reviewed:    The following studies were reviewed today:   EKG:  The ekg ordered today  demonstrates sinus rhythm, heart rate 68 bpm with prolonged PR interval and nonspecific interventricular conduction delay.  July 03, 2020 IMPRESSIONS   1. Frequent PVCs during the study.   2. Left ventricular ejection fraction, by estimation, is 55 to 60%. The  left ventricle has normal function. The left ventricle has no regional  wall motion abnormalities. There is mild concentric left ventricular  hypertrophy. Left ventricular diastolic  parameters are consistent with Grade I diastolic dysfunction (impaired  relaxation). The average left ventricular global longitudinal strain is  -8.3 %. The global longitudinal strain is abnormal.   3. Right ventricular systolic function is normal. The right ventricular  size is normal. There is normal pulmonary artery systolic pressure.   4. The mitral valve is normal in structure. No evidence of mitral valve  regurgitation. No evidence of mitral stenosis.   5. The aortic valve is normal in structure. Aortic valve regurgitation is  not visualized. No aortic stenosis is present.   6. Abdominal aorta mildly dilalated (2.3 cm).   7. No  pericardial effusion.   FINDINGS   Left Ventricle: Left ventricular ejection fraction, by estimation, is 55  to 60%. The left ventricle has normal function. The left ventricle has no  regional wall motion abnormalities. The average left ventricular global  longitudinal strain is -8.3 %. The   global longitudinal strain is abnormal. The left ventricular internal  cavity size was normal in size. There is mild concentric left ventricular  hypertrophy. Left ventricular diastolic parameters are consistent with  Grade I diastolic dysfunction  (impaired relaxation).   Right Ventricle: The right ventricular size is normal. No increase in  right ventricular wall thickness. Right ventricular systolic function is  normal. There is normal pulmonary artery systolic pressure. The tricuspid  regurgitant velocity is 2.55 m/s, and   with an assumed right atrial pressure of 3 mmHg, the estimated right  ventricular systolic pressure is 0000000 mmHg.   Left Atrium: Left atrial size was normal in size.   Right Atrium: Right atrial size was normal in size.   Pericardium: There is no evidence of pericardial effusion. Presence of  pericardial fat pad.   Mitral Valve: The mitral valve is normal in structure. No evidence of  mitral valve regurgitation. No evidence of mitral valve stenosis.   Tricuspid Valve: The tricuspid valve is normal in structure. Tricuspid  valve regurgitation is not demonstrated. No evidence of tricuspid  stenosis.   Aortic Valve: The aortic valve is normal in structure. Aortic valve  regurgitation is not visualized. No aortic stenosis is present.   Pulmonic Valve: The pulmonic valve was normal in structure. Pulmonic valve  regurgitation is not visualized. No evidence of pulmonic stenosis.   Aorta: Abdominal aorta mildly dilalated (2.3 cm). The aortic root is  normal in size and structure.   Venous: The inferior vena cava is normal in size with greater than 50%  respiratory  variability, suggesting right atrial pressure of 3 mmHg.   IAS/Shunts: No atrial level shunt detected by color flow Doppler.       ZIO monitor The patient wore the monitor for 13 days 19 hours starting May 22, 2020 Indication: Palpitations   The minimum heart rate was 32 bpm, maximum heart rate was 165 bpm, and average heart rate was 71 bpm. Predominant underlying rhythm was Sinus Rhythm. Second Degree AV Block-Mobitz I (Wenckebach) was present     Premature atrial complexes were rare less than 1%. Premature Ventricular complexes where frequent (9.4%, [id number]).  No pauses, No AV block and no atrial fibrillation present. No patient triggered events or diary events were noted   Conclusion: This study is remarkable for the following:                                  1.  Frequent premature ventricular complex.                                  2.  Single episode of type I second-degree AV block (Wenckebach)-this is nonpathological.  Recent Labs: 09/15/2020: Hemoglobin 11.9; Platelets 137 09/25/2020: ALT 14; BUN 17; Creatinine, Ser 1.07; Magnesium 2.2; Potassium 4.6; Sodium 138; TSH 2.790  Recent Lipid Panel    Component Value Date/Time   TRIG 124 04/07/2019 1039    Physical Exam:    VS:  BP (!) 150/70 (BP Location: Left Arm, Patient Position: Sitting, Cuff Size: Normal)   Pulse 68   Ht 5' 11.5" (1.816 m)   Wt 189 lb 1.3 oz (85.8 kg)   SpO2 97%   BMI 26.00 kg/m     Wt Readings from Last 3 Encounters:  11/13/20 189 lb 1.3 oz (85.8 kg)  09/25/20 189 lb 3.2 oz (85.8 kg)  09/15/20 188 lb 3.2 oz (85.4 kg)     GEN: Well nourished, well developed in no acute distress HEENT: Normal NECK: No JVD; No carotid bruits LYMPHATICS: No lymphadenopathy CARDIAC: S1S2 noted,RRR, no murmurs, rubs, gallops RESPIRATORY:  Clear to auscultation without rales, wheezing or rhonchi  ABDOMEN: Soft, non-tender, non-distended, +bowel sounds, no guarding. EXTREMITIES: No edema, No cyanosis, no  clubbing MUSCULOSKELETAL:  No deformity  SKIN: Warm and dry NEUROLOGIC:  Alert and oriented x 3, non-focal PSYCHIATRIC:  Normal affect, good insight  ASSESSMENT:    1. Symptomatic PVCs   2. Primary hypertension    PLAN:    His blood pressure is slightly elevated today.  This is an isolated reading.  We will continue to have the patient monitor his blood pressure and should this be elevated we will optimize his antihypertensive medication. Great news he is having great symptomatic relief on the current management of his amiodarone as well as his Cardizem.  He has had some admitting watery stools but tells me over the last several days it has improved.  We will continue to monitor.  This may likely be a side effect from his amiodarone but for right now since he is improving no changes will be made.  The patient is in agreement with the above plan. The patient left the office in stable condition.  The patient will follow up in 6 months.  The patient prefers to see Dr. Bettina Grant as I transition to our [facility] line office.   Medication Adjustments/Labs and Tests Ordered: Current medicines are reviewed at length with the patient today.  Concerns regarding medicines are outlined above.  Orders Placed This Encounter  Procedures   EKG 12-Lead   No orders of the defined types were placed in this encounter.   Patient Instructions  Medication Instructions:  Your physician recommends that you continue on your current medications as directed. Please refer to the Current Medication list given to you today.  *If you need a refill on your cardiac medications before your next appointment, please call your pharmacy*   Lab Work: None If you have labs (blood work) drawn today and  your tests are completely normal, you will receive your results only by: MyChart Message (if you have MyChart) OR A paper copy in the mail If you have any lab test that is abnormal or we need to change your treatment, we will  call you to review the results.   Testing/Procedures: None   Follow-Up: At [facility], you and your health needs are our priority.  As part of our continuing mission to provide you with exceptional heart care, we have created designated Provider Care Teams.  These Care Teams include your primary Cardiologist (physician) and Advanced Practice Providers (APPs -  Physician Assistants and Nurse Practitioners) who all work together to provide you with the care you need, when you need it.  We recommend signing up for the patient portal called "MyChart".  Sign up information is provided on this After Visit Summary.  MyChart is used to connect with patients for Virtual Visits (Telemedicine).  Patients are able to view lab/test results, encounter notes, upcoming appointments, etc.  Non-urgent messages can be sent to your provider as well.   To learn Grant about what you can do with MyChart, go to [email or web address].    Your next appointment:   6 month(s)  The format for your next appointment:   In Person  Provider:   Shirlee More, MD   Other Instructions    Adopting a Healthy Lifestyle.  Know what a healthy weight is for you (roughly BMI <25) and aim to maintain this   Aim for 7+ servings of fruits and vegetables daily   65-80+ fluid ounces of water or unsweet tea for healthy kidneys   Limit to max 1 drink of alcohol per day; avoid smoking/tobacco   Limit animal fats in diet for cholesterol and heart health - choose grass fed whenever available   Avoid highly processed foods, and foods high in saturated/trans fats   Aim for low stress - take time to unwind and care for your mental health   Aim for 150 min of moderate intensity exercise weekly for heart health, and weights twice weekly for bone health   Aim for 7-9 hours of sleep daily   When it comes to diets, agreement about the perfect plan isnt easy to find, even among the experts. Experts at the [place] developed an idea known as the Healthy Eating Plate. Just imagine a plate divided into logical, healthy portions.   The emphasis is on diet quality:   Load up on vegetables and fruits - one-half of your plate: Aim for color and variety, and remember that potatoes dont count.   Go for whole grains - one-quarter of your plate: Whole wheat, barley, wheat berries, quinoa, oats, brown rice, and foods made with them. If you want pasta, go with whole wheat pasta.   Protein power - one-quarter of your plate: Fish, chicken, beans, and nuts are all healthy, versatile protein sources. Limit red meat.   The diet, however, does go beyond the plate, offering a few other suggestions.   Use healthy plant oils, such as olive, canola, soy, corn, sunflower and peanut. Check the labels, and avoid partially hydrogenated oil, which have unhealthy trans fats.   If youre thirsty, drink water. Coffee and tea are good in moderation, but skip sugary drinks and limit milk and dairy products to one or two daily servings.   The type of carbohydrate in the diet is Grant important than the amount. Some sources of carbohydrates, such as vegetables,  fruits, whole grains, and beans-are healthier than others.   Finally, stay active  Signed, Berniece Salines, DO  11/13/2020 1:49 PM    [facility]

## 2020-11-13 NOTE — Patient Instructions (Signed)

## 2020-12-07 DIAGNOSIS — E785 Hyperlipidemia, unspecified: Secondary | ICD-10-CM | POA: Diagnosis not present

## 2020-12-07 DIAGNOSIS — G8191 Hemiplegia, unspecified affecting right dominant side: Secondary | ICD-10-CM | POA: Diagnosis not present

## 2020-12-07 DIAGNOSIS — D638 Anemia in other chronic diseases classified elsewhere: Secondary | ICD-10-CM | POA: Diagnosis not present

## 2020-12-07 DIAGNOSIS — I10 Essential (primary) hypertension: Secondary | ICD-10-CM | POA: Diagnosis not present

## 2020-12-07 DIAGNOSIS — D519 Vitamin B12 deficiency anemia, unspecified: Secondary | ICD-10-CM | POA: Diagnosis not present

## 2020-12-07 DIAGNOSIS — I471 Supraventricular tachycardia: Secondary | ICD-10-CM | POA: Diagnosis not present

## 2020-12-07 DIAGNOSIS — R600 Localized edema: Secondary | ICD-10-CM | POA: Diagnosis not present

## 2020-12-11 DIAGNOSIS — D519 Vitamin B12 deficiency anemia, unspecified: Secondary | ICD-10-CM | POA: Diagnosis not present

## 2020-12-27 ENCOUNTER — Ambulatory Visit: Payer: Medicare Other | Admitting: Pulmonary Disease

## 2020-12-27 ENCOUNTER — Other Ambulatory Visit: Payer: Self-pay

## 2020-12-27 ENCOUNTER — Encounter: Payer: Self-pay | Admitting: Pulmonary Disease

## 2020-12-27 VITALS — BP 150/80 | HR 68 | Temp 97.8°F | Ht 71.0 in | Wt 189.0 lb

## 2020-12-27 DIAGNOSIS — Z9989 Dependence on other enabling machines and devices: Secondary | ICD-10-CM

## 2020-12-27 DIAGNOSIS — G4733 Obstructive sleep apnea (adult) (pediatric): Secondary | ICD-10-CM | POA: Diagnosis not present

## 2020-12-27 NOTE — Patient Instructions (Signed)
I will see you in about 6 months  Continue using CPAP on a regular basis  Call with any significant concerns

## 2020-12-27 NOTE — Progress Notes (Signed)
[address] Jimmy Grant    [id number]    1924-05-19  Primary Care Physician:Schultz, Lora Havens, MD  Referring Physician: Nicoletta Dress, MD [address] [place],  [place] [id number]  Chief complaint:   Patient with a history of mild obstructive sleep apnea for which he has been compliant with CPAP use In for follow up  HPI:  Has used CPAP since 2017 Currently on CPAP of 10  Continues to tolerate CPAP well  Recently changed his machine Has noted his AHI as since been in the low single digits compared with his previous machine  Clinically does not feel any different Continues to feel well generally Wakes up feeling like is at a good nights rest  We did have a discussion about the inspire device today  Diagnosed with mild obstructive sleep apnea Titrated to a pressure of 9 in the lab At some point he was on a pressure of 12 but was brought down to 9 secondary to mask leaks  Usually goes to bed between 9 and 10 PM He is a side sleeper Usually takes him about 5 minutes to an hour to fall asleep About 4 awakenings Final wake up time about 6 AM  He feels his sleep is restorative He does not wake up to use the bathroom He does have musculoskeletal pains and discomfort but does not feel this is contributing to his awakenings  He started on CPAP secondary to having tachycardia when he wakes up from sleep, this is controlled at present  He does keep a very good log about his machine and his AHI  Currently uses a full facemask  Outpatient Encounter Medications as of 02/21/2020  Medication Sig   acebutolol (SECTRAL) 400 MG capsule Take 400 mg by mouth daily.   aspirin EC 81 MG tablet Take 81 mg by mouth daily.   Azelastine HCl 0.15 % SOLN Place 2 sprays into both nostrils daily.   Calcium Carbonate-Vit D-Min (CALCIUM 1200 PO) Take 1,200 mg by mouth daily.   cholecalciferol (VITAMIN D3) 25 MCG (1000 UT) tablet Take 1,000 Units by mouth daily.    DIOVAN 160 MG tablet Take 160 mg by mouth 2 (two) times daily.    dorzolamide-timolol (COSOPT) 22.3-6.8 MG/ML ophthalmic solution Place 1 drop into both eyes 2 (two) times daily.   Misc Natural Products (GLUCOSAMINE CHOND DOUBLE STR PO) Take 1 tablet by mouth daily.   Multiple Vitamins-Minerals (PRESERVISION AREDS 2+MULTI VIT) CAPS Take 1 capsule by mouth 2 (two) times daily.   simvastatin (ZOCOR) 20 MG tablet Take 20 mg by mouth every evening.   ibuprofen (ADVIL) 200 MG tablet Take 200-400 mg by mouth every 6 (six) hours as needed for fever. (Patient not taking: Reported on 02/21/2020)   [DISCONTINUED] dexamethasone (DECADRON) 6 MG tablet Take 1 tablet (6 mg total) by mouth daily. (Patient not taking: Reported on 02/21/2020)   No facility-administered encounter medications on file as of 02/21/2020.    Allergies as of 02/21/2020 - Review Complete 02/21/2020  Allergen Reaction Noted   Brimonidine Other (See Comments) 01/27/2014    Past Medical History:  Diagnosis Date   Branch retinal vein occlusion    Dyslipidemia    Glaucoma    History of prostate cancer    OSA on CPAP     Past Surgical History:  Procedure Laterality Date   VIDEO ASSISTED THORACOSCOPY (VATS)/DECORTICATION  2012    No family history on file.  Social  History   Socioeconomic History   Marital status: Married    Spouse name: Not on file   Number of children: Not on file   Years of education: Not on file   Highest education level: Not on file  Occupational History   Occupation: retired  Tobacco Use   Smoking status: Former Smoker    Packs/day: 1.00    Years: 40.00    Pack years: 40.00    Types: Cigarettes   Smokeless tobacco: Never Used  Substance and Sexual Activity   Alcohol use: Yes    Alcohol/week: 7.0 standard drinks    Types: 7 Glasses of wine per week   Drug use: Never   Sexual activity: Not on file  Other Topics Concern   Not on file  Social History Narrative   Not on file   Social  Determinants of Health   Financial Resource Strain:    Difficulty of Paying Living Expenses: Not on file  Food Insecurity:    Worried About [place] in the Last Year: Not on file   Ran Out of Food in the Last Year: Not on file  Transportation Needs:    Lack of Transportation (Medical): Not on file   Lack of Transportation (Non-Medical): Not on file  Physical Activity:    Days of Exercise per Week: Not on file   Minutes of Exercise per Session: Not on file  Stress:    Feeling of Stress : Not on file  Social Connections:    Frequency of Communication with Friends and Family: Not on file   Frequency of Social Gatherings with Friends and Family: Not on file   Attends Religious Services: Not on file   Active Member of Clubs or Organizations: Not on file   Attends Archivist Meetings: Not on file   Marital Status: Not on file  Intimate Partner Violence:    Fear of Current or Ex-Partner: Not on file   Emotionally Abused: Not on file   Physically Abused: Not on file   Sexually Abused: Not on file    Review of Systems  Respiratory:  Positive for apnea.   Psychiatric/Behavioral:  Positive for sleep disturbance.    Vitals:   02/21/20 1109  BP: 126/60  Pulse: 74  Temp: (!) 97.5 F (36.4 C)  SpO2: 97%     Physical Exam Constitutional:      Appearance: Normal appearance. He is well-developed.  HENT:     Nose: Nose normal.  Neck:     Thyroid: No thyromegaly.     Trachea: No tracheal deviation.  Cardiovascular:     Rate and Rhythm: Normal rate and regular rhythm.  Pulmonary:     Effort: Pulmonary effort is normal. No respiratory distress.     Breath sounds: Normal breath sounds. No stridor. No wheezing, rhonchi or rales.  Musculoskeletal:     Cervical back: No rigidity or tenderness.  Neurological:     Mental Status: He is alert.  Psychiatric:        Mood and Affect: Mood normal.   Data Reviewed: Sleep study from 2017 did reveal mild obstructive  sleep apnea Titration study showed he was titrated to a pressure of 9 with residual AHI 1.4 Patient's AHI on this data that he keeps regularly shows AHI fluctuations from 4-13  Compliance data reveals AHI 2.5 100% compliant Average use of 7 hours 45 minutes Currently on pressure of 11  Assessment:  Obstructive sleep apnea -Continue on pressure of 11  He feels well, sleeps well He tolerates CPAP well  Discussion about inspire device  Discussion about options of treatment about sleep apnea  Plan/Recommendations: Continue CPAP at 11  Encouraged to call with any significant concerns  We talked about how we will figure out somebody does not need CPAP any longer-this will require him having a sleep study  I will follow-up with him in about 3 months  I spent 30 minutes dedicated to the care of this patient on the date of this encounter to include previsit review of records, face-to-face time with the patient discussing conditions above, post visit ordering of testing, clinical documentation with electronic health record and communicated necessary findings to members of the patient's care team   Sherrilyn Rist MD [facility] 02/21/2020, 11:26 AM  CC: Nicoletta Dress, MD

## 2021-01-10 DIAGNOSIS — D519 Vitamin B12 deficiency anemia, unspecified: Secondary | ICD-10-CM | POA: Diagnosis not present

## 2021-02-01 DIAGNOSIS — H353132 Nonexudative age-related macular degeneration, bilateral, intermediate dry stage: Secondary | ICD-10-CM

## 2021-02-01 DIAGNOSIS — H34832 Tributary (branch) retinal vein occlusion, left eye, with macular edema: Secondary | ICD-10-CM | POA: Diagnosis not present

## 2021-02-01 DIAGNOSIS — Z961 Presence of intraocular lens: Secondary | ICD-10-CM | POA: Diagnosis not present

## 2021-02-01 DIAGNOSIS — H401132 Primary open-angle glaucoma, bilateral, moderate stage: Secondary | ICD-10-CM | POA: Diagnosis not present

## 2021-02-01 DIAGNOSIS — D3131 Benign neoplasm of right choroid: Secondary | ICD-10-CM | POA: Diagnosis not present

## 2021-02-01 HISTORY — DX: Nonexudative age-related macular degeneration, bilateral, intermediate dry stage: H35.3132

## 2021-02-12 DIAGNOSIS — D519 Vitamin B12 deficiency anemia, unspecified: Secondary | ICD-10-CM | POA: Diagnosis not present

## 2021-02-13 DIAGNOSIS — G4733 Obstructive sleep apnea (adult) (pediatric): Secondary | ICD-10-CM | POA: Diagnosis not present

## 2021-02-21 DIAGNOSIS — Z9181 History of falling: Secondary | ICD-10-CM | POA: Diagnosis not present

## 2021-02-21 DIAGNOSIS — Z0000 Encounter for general adult medical examination without abnormal findings: Secondary | ICD-10-CM | POA: Diagnosis not present

## 2021-02-21 DIAGNOSIS — E785 Hyperlipidemia, unspecified: Secondary | ICD-10-CM | POA: Diagnosis not present

## 2021-02-21 DIAGNOSIS — Z23 Encounter for immunization: Secondary | ICD-10-CM | POA: Diagnosis not present

## 2021-02-28 DIAGNOSIS — I10 Essential (primary) hypertension: Secondary | ICD-10-CM | POA: Diagnosis not present

## 2021-02-28 DIAGNOSIS — E785 Hyperlipidemia, unspecified: Secondary | ICD-10-CM | POA: Diagnosis not present

## 2021-03-09 NOTE — Progress Notes (Signed)
Jimmy Grant  [address] [place],  [place]  [address] [phone]  Clinic Day:  03/19/2021  Referring physician: Nicoletta Dress, MD  This document serves as a record of services personally performed by Jimmy Macarthur Critchley, MD. It was created on their behalf by [facility] E, a trained medical scribe. The creation of this record is based on the scribe's personal observations and the provider's statements to them.  HISTORY OF PRESENT ILLNESS:  The patient is a 87 y.o. male who I follow for mild leukopenia and anemia.  As his peripheral counts have held stable, they have been followed conservatively.  He comes in today for routine follow-up.  Since his last visit, the patient has been doing well.  He denies having any spontaneous infections as it pertains to his leukopenia.  He denies having any overt forms of blood loss as it pertains to his mild anemia.  Overall, he denies having any changes in his health over these past months.   PHYSICAL EXAM:  Blood pressure (!) 181/78, pulse 60, temperature 97.7 F (36.5 C), resp. rate 16, height 5\' 11"  (1.803 m), weight 186 lb 9.6 oz (84.6 kg), SpO2 97 %. Wt Readings from Last 3 Encounters:  03/19/21 186 lb 9.6 oz (84.6 kg)  12/27/20 189 lb (85.7 kg)  11/13/20 189 lb 1.3 oz (85.8 kg)   Body mass index is 26.03 kg/m. Performance status (ECOG): 0 Physical Exam Constitutional:      Appearance: Normal appearance. He is not ill-appearing.  HENT:     Mouth/Throat:     Mouth: Mucous membranes are moist.     Pharynx: Oropharynx is clear. No oropharyngeal exudate or posterior oropharyngeal erythema.  Cardiovascular:     Rate and Rhythm: Normal rate and regular rhythm.     Heart sounds: No murmur heard.   No friction rub. No gallop.  Pulmonary:     Effort: Pulmonary effort is normal. No respiratory distress.     Breath sounds: Normal breath sounds. No wheezing, rhonchi or rales.  Abdominal:     General: Bowel  sounds are normal. There is no distension.     Palpations: Abdomen is soft. There is no mass.     Tenderness: There is no abdominal tenderness.  Musculoskeletal:        General: No swelling.     Right lower leg: No edema.     Left lower leg: No edema.  Lymphadenopathy:     Cervical: No cervical adenopathy.     Upper Body:     Right upper body: No supraclavicular or axillary adenopathy.     Left upper body: No supraclavicular or axillary adenopathy.     Lower Body: No right inguinal adenopathy. No left inguinal adenopathy.  Skin:    General: Skin is warm.     Coloration: Skin is not jaundiced.     Findings: No lesion or rash.  Neurological:     General: No focal deficit present.     Mental Status: He is alert and oriented to person, place, and time. Mental status is at baseline.  Psychiatric:        Mood and Affect: Mood normal.        Behavior: Behavior normal.        Thought Content: Thought content normal.    LABS:     CBC Latest Ref Rng & Units 03/19/2021 09/15/2020 03/17/2020  WBC - 5.0 5.5 6.3  Hemoglobin 13.5 - 17.5 11.7(A) 11.9(A) 11.9(A)  Hematocrit 41 - 53 36(A) 35(A) 36(A)  Platelets 150 - 399 123(A) 137(A) 125(A)   CMP Latest Ref Rng & Units 09/25/2020 09/15/2020 03/17/2020  Glucose 65 - 99 mg/dL 88 - -  BUN 10 - 36 mg/dL 17 14 21   Creatinine 0.76 - 1.27 mg/dL 1.07 1.0 1.2  Sodium 134 - 144 mmol/L 138 137 139  Potassium 3.5 - 5.2 mmol/L 4.6 4.7 4.5  Chloride 96 - 106 mmol/L 104 104 103  CO2 20 - 29 mmol/L 21 26(A) 26(A)  Calcium 8.6 - 10.2 mg/dL 9.3 9.0 9.6  Total Protein 6.0 - 8.5 g/dL 6.1 - -  Total Bilirubin 0.0 - 1.2 mg/dL 0.3 - -  Alkaline Phos 44 - 121 IU/L 89 79 63  AST 0 - 40 IU/L 18 24 22   ALT 0 - 44 IU/L 14 24 15     ASSESSMENT & PLAN:  Assessment/Plan:  A 87 y.o. male with mild leukopenia and anemia.  When evaluating his labs today, his white cell and hemoglobin levels continue to hold stable.  He still has increased monocytes, which suggests  there could be underlying myelodysplasia or the very early stages of chronic myelomonocytic leukemia.  However, as his counts remain fine and he is clinically doing well,  I will continue to follow him conservatively.  I will see him back in 6 months for repeat clinical assessment.  The patient understands all the plans discussed today and is in agreement with them.     I, Rita Ohara, am acting as scribe for [name], MD    I have reviewed this report as typed by the medical scribe, and it is complete and accurate.  Jimmy Macarthur Critchley, MD

## 2021-03-15 DIAGNOSIS — I471 Supraventricular tachycardia: Secondary | ICD-10-CM | POA: Diagnosis not present

## 2021-03-15 DIAGNOSIS — D519 Vitamin B12 deficiency anemia, unspecified: Secondary | ICD-10-CM | POA: Diagnosis not present

## 2021-03-15 DIAGNOSIS — D638 Anemia in other chronic diseases classified elsewhere: Secondary | ICD-10-CM | POA: Diagnosis not present

## 2021-03-15 DIAGNOSIS — E785 Hyperlipidemia, unspecified: Secondary | ICD-10-CM | POA: Diagnosis not present

## 2021-03-15 DIAGNOSIS — G8191 Hemiplegia, unspecified affecting right dominant side: Secondary | ICD-10-CM | POA: Diagnosis not present

## 2021-03-15 DIAGNOSIS — I10 Essential (primary) hypertension: Secondary | ICD-10-CM | POA: Diagnosis not present

## 2021-03-15 DIAGNOSIS — R600 Localized edema: Secondary | ICD-10-CM | POA: Diagnosis not present

## 2021-03-19 ENCOUNTER — Other Ambulatory Visit: Payer: Self-pay | Admitting: Hematology and Oncology

## 2021-03-19 ENCOUNTER — Inpatient Hospital Stay: Payer: Medicare Other | Attending: Oncology | Admitting: Oncology

## 2021-03-19 ENCOUNTER — Inpatient Hospital Stay: Payer: Medicare Other

## 2021-03-19 ENCOUNTER — Other Ambulatory Visit: Payer: Self-pay | Admitting: Oncology

## 2021-03-19 VITALS — BP 181/78 | HR 60 | Temp 97.7°F | Resp 16 | Ht 71.0 in | Wt 186.6 lb

## 2021-03-19 DIAGNOSIS — D72819 Decreased white blood cell count, unspecified: Secondary | ICD-10-CM

## 2021-03-19 DIAGNOSIS — D649 Anemia, unspecified: Secondary | ICD-10-CM

## 2021-03-19 DIAGNOSIS — D539 Nutritional anemia, unspecified: Secondary | ICD-10-CM

## 2021-03-19 LAB — CBC
MCV: 92 (ref 80–94)
RBC: 3.88 (ref 3.87–5.11)

## 2021-03-19 LAB — CBC AND DIFFERENTIAL
HCT: 36 — AB (ref 41–53)
Hemoglobin: 11.7 — AB (ref 13.5–17.5)
Neutrophils Absolute: 3.05
Platelets: 123 — AB (ref 150–399)
WBC: 5

## 2021-04-16 DIAGNOSIS — D519 Vitamin B12 deficiency anemia, unspecified: Secondary | ICD-10-CM | POA: Diagnosis not present

## 2021-05-14 DIAGNOSIS — G4733 Obstructive sleep apnea (adult) (pediatric): Secondary | ICD-10-CM | POA: Diagnosis not present

## 2021-05-17 DIAGNOSIS — D519 Vitamin B12 deficiency anemia, unspecified: Secondary | ICD-10-CM | POA: Diagnosis not present

## 2021-05-28 ENCOUNTER — Ambulatory Visit: Payer: Medicare Other | Admitting: Cardiology

## 2021-05-29 DIAGNOSIS — I10 Essential (primary) hypertension: Secondary | ICD-10-CM | POA: Diagnosis not present

## 2021-05-29 DIAGNOSIS — E785 Hyperlipidemia, unspecified: Secondary | ICD-10-CM | POA: Diagnosis not present

## 2021-05-31 ENCOUNTER — Other Ambulatory Visit: Payer: Self-pay

## 2021-05-31 ENCOUNTER — Encounter: Payer: Self-pay | Admitting: Cardiology

## 2021-05-31 ENCOUNTER — Ambulatory Visit: Payer: Medicare Other | Admitting: Cardiology

## 2021-05-31 VITALS — BP 140/70 | HR 65 | Ht 71.0 in | Wt 185.0 lb

## 2021-05-31 DIAGNOSIS — Z79899 Other long term (current) drug therapy: Secondary | ICD-10-CM | POA: Diagnosis not present

## 2021-05-31 DIAGNOSIS — E782 Mixed hyperlipidemia: Secondary | ICD-10-CM

## 2021-05-31 DIAGNOSIS — I10 Essential (primary) hypertension: Secondary | ICD-10-CM

## 2021-05-31 DIAGNOSIS — I493 Ventricular premature depolarization: Secondary | ICD-10-CM | POA: Diagnosis not present

## 2021-05-31 NOTE — Progress Notes (Signed)
Cardiology Office Note:    Date:  05/31/2021   ID:  Jimmy Lucks., DOB [birth year], MRN [id number]  PCP:  Nicoletta Dress, MD  Cardiologist:  Shirlee More, MD    Referring MD: Nicoletta Dress, MD    ASSESSMENT:    1. PVC (premature ventricular contraction)   2. On amiodarone therapy   3. Primary hypertension   4. Mixed hyperlipidemia    PLAN:    In order of problems listed above:  I think his predominant complaint is malaise and fatigue may well be hypothyroid also is having relatively low heart rate and blood pressure.  His rate limiting calcium channel blocker continue the beta-blocker eyedrops low-dose amiodarone quickly evaluate for hypothyroidism. Blood pressure is overtreated for age stop calcium channel blocker if he requires another agent I would add a low-dose diuretic to his ARB continue his statin  Next appointment: 3 months   Medication Adjustments/Labs and Tests Ordered: Current medicines are reviewed at length with the patient today.  Concerns regarding medicines are outlined above.  No orders of the defined types were placed in this encounter.  No orders of the defined types were placed in this encounter.   Chief Complaint  Patient presents with   Follow-up    Frequent PVCs on amiodarone   Predominant complaint is malaise and fatigue and is at low heart rate and blood pressure  History of Present Illness:    Jimmy Campoli. is a 88 y.o. male with a hx of frequent PVCs hyperlipidemia and hypertension last seen 11/13/2020.  He was initiated on amiodarone 09/25/2020 Compliance with diet, lifestyle and medications: Yes  Although his PVCs have resolved he feels badly very weak fatigued has decreased his calcium channel blocker and amiodarone 50% and still at times gets blood pressures less than 401 systolic heart rate 60 bpm and is concerned about how he feels and his heart rate and blood pressure. No palpitations syncope chest  pain edema shortness of breath He also takes beta-blocker eyedrops that can slow his heart rate He is at risk for amiodarone induced hypothyroidism and has not had thyroid test checked  For now he will stop his Cardizem trend heart rate and blood pressure dropping off in the office in 2 weeks he does not do Internet email continue amiodarone and today we will check labs including a CBC thyroid TSH T3-T4 and a CMP and continue his low-dose amiodarone. Past Medical History:  Diagnosis Date   'light-for-dates' infant with signs of fetal malnutrition 08/19/2016   Acute respiratory disease due to COVID-19 virus 04/07/2019   Anemia 10/17/2017   Branch retinal vein occlusion    Cardiac arrhythmia 07/23/2016   Choroidal nevus, right 11/13/2017   Dyslipidemia    Edema of both legs    Glaucoma    History of prostate cancer    Hypercholesterolemia 06/08/2018   Hypertension 07/23/2016   Iritis, recurrent, bilateral 12/09/2013   Late effects of CVA (cerebrovascular accident)    Leukopenia 10/17/2017   Lumbar disc narrowing    Macular edema 07/16/2012   Malignant neoplasm of prostate ([place]) 03/17/2020   Mixed hyperlipidemia 06/02/2020   Jimmy on CPAP    Osteoarthritis of right glenohumeral joint    PAC (premature atrial contraction) 06/02/2020   Primary hypertension 06/02/2020   Primary open angle glaucoma (POAG) of both eyes, moderate stage 07/16/2012   Prostate cancer ([place])    PSVT (paroxysmal supraventricular tachycardia) ([place]) 08/12/2005   PVC (premature ventricular contraction) 06/02/2020  Status post intraocular lens implant 07/16/2012   Thyroid nodule    Vitamin B12 deficiency     Past Surgical History:  Procedure Laterality Date   ADENOIDECTOMY  1931   CATARACT EXTRACTION Bilateral    Left on 05/04/96, Right on 05/08/1993   HEMORROIDECTOMY  11/21/2014   Done by Kendell Bane   PROSTATECTOMY  01/11/1999   Transurethral   TEAR DUCT PROBING Right 07/2018   Tear duct surgery / Dr. Lorina Rabon   VIDEO  ASSISTED THORACOSCOPY (VATS)/DECORTICATION  2012    Current Medications: Current Meds  Medication Sig   amiodarone (PACERONE) 200 MG tablet Take 200 mg by mouth daily.   aspirin EC 81 MG tablet Take 81 mg by mouth daily.   Azelastine HCl 0.15 % SOLN Place 2 sprays into both nostrils daily.   calcium-vitamin D (OSCAL WITH D) 500-200 MG-UNIT tablet Take 1 tablet by mouth daily.   diltiazem (DILACOR XR) 120 MG 24 hr capsule Take 120 mg by mouth daily.   DIOVAN 160 MG tablet Take 160 mg by mouth 2 (two) times daily.    dorzolamide-timolol (COSOPT) 22.3-6.8 MG/ML ophthalmic solution Place 1 drop into both eyes 2 (two) times daily.   Glucosamine-Chondroitin (GLUCOSAMINE CHONDR COMPLEX PO) Take 1 tablet by mouth daily.   Multiple Vitamins-Minerals (PRESERVISION AREDS 2+MULTI VIT) CAPS Take 1 capsule by mouth 2 (two) times daily.   simvastatin (ZOCOR) 20 MG tablet Take 20 mg by mouth every evening.     Allergies:   Patient has no known allergies.   Social History   Socioeconomic History   Marital status: Married    Spouse name: Not on file   Number of children: Not on file   Years of education: Not on file   Highest education level: Not on file  Occupational History   Occupation: retired  Tobacco Use   Smoking status: Former    Packs/day: 1.00    Years: 40.00    Pack years: 40.00    Types: Cigarettes    Quit date: 1971    Years since quitting: 52.2   Smokeless tobacco: Never  Substance and Sexual Activity   Alcohol use: Yes    Alcohol/week: 7.0 standard drinks    Types: 7 Glasses of wine per week   Drug use: Never   Sexual activity: Not on file  Other Topics Concern   Not on file  Social History Narrative   Not on file   Social Determinants of Health   Financial Resource Strain: Not on file  Food Insecurity: Not on file  Transportation Needs: Not on file  Physical Activity: Not on file  Stress: Not on file  Social Connections: Not on file     Family History: The  patient's family history includes Diabetes in his mother; Hypertension in his mother; Kidney disease in his father. ROS:   Please see the history of present illness.    All other systems reviewed and are negative.  EKGs/Labs/Other Studies Reviewed:    The following studies were reviewed today: Echocardiogram: July 03, 2020 IMPRESSIONS   1. Frequent PVCs during the study.   2. Left ventricular ejection fraction, by estimation, is 55 to 60%. The  left ventricle has normal function. The left ventricle has no regional  wall motion abnormalities. There is mild concentric left ventricular  hypertrophy. Left ventricular diastolic  parameters are consistent with Grade I diastolic dysfunction (impaired  relaxation). The average left ventricular global longitudinal strain is  -8.3 %. The global longitudinal strain  is abnormal.   3. Right ventricular systolic function is normal. The right ventricular  size is normal. There is normal pulmonary artery systolic pressure.   4. The mitral valve is normal in structure. No evidence of mitral valve  regurgitation. No evidence of mitral stenosis.   5. The aortic valve is normal in structure. Aortic valve regurgitation is  not visualized. No aortic stenosis is present.   6. Abdominal aorta mildly dilalated (2.3 cm).   7. No pericardial effusion.    July 03, 2020 IMPRESSIONS   1. Frequent PVCs during the study.   2. Left ventricular ejection fraction, by estimation, is 55 to 60%. The  left ventricle has normal function. The left ventricle has no regional  wall motion abnormalities. There is mild concentric left ventricular  hypertrophy. Left ventricular diastolic  parameters are consistent with Grade I diastolic dysfunction (impaired  relaxation). The average left ventricular global longitudinal strain is  -8.3 %. The global longitudinal strain is abnormal.   3. Right ventricular systolic function is normal. The right ventricular  size is normal.  There is normal pulmonary artery systolic pressure.   4. The mitral valve is normal in structure. No evidence of mitral valve  regurgitation. No evidence of mitral stenosis.   5. The aortic valve is normal in structure. Aortic valve regurgitation is  not visualized. No aortic stenosis is present.   6. Abdominal aorta mildly dilalated (2.3 cm).   7. No pericardial effusion.    EKG:  EKG ordered today and personally reviewed.  The ekg ordered today demonstrates sinus rhythm left bundle branch block  Recent Labs: 09/25/2020: ALT 14; BUN 17; Creatinine, Ser 1.07; Magnesium 2.2; Potassium 4.6; Sodium 138; TSH 2.790 03/19/2021: Hemoglobin 11.7; Platelets 123   Recent Lipid Panel    Component Value Date/Time   TRIG 124 04/07/2019 1039    Physical Exam:    VS:  BP 140/70 (BP Location: Right Arm, Patient Position: Sitting, Cuff Size: Normal)    Pulse 65    Ht 5\' 11"  (1.803 m)    Wt 185 lb (83.9 kg)    SpO2 97%    BMI 25.80 kg/m     Wt Readings from Last 3 Encounters:  05/31/21 185 lb (83.9 kg)  03/19/21 186 lb 9.6 oz (84.6 kg)  12/27/20 189 lb (85.7 kg)     GEN: Appears his age well nourished, well developed in no acute distress HEENT: Normal NECK: No JVD; No carotid bruits LYMPHATICS: No lymphadenopathy CARDIAC: S2 paradoxical RRR, no murmurs, rubs, gallops RESPIRATORY:  Clear to auscultation without rales, wheezing or rhonchi  ABDOMEN: Soft, non-tender, non-distended MUSCULOSKELETAL:  No edema; No deformity  SKIN: Warm and dry NEUROLOGIC:  Alert and oriented x 3 PSYCHIATRIC:  Normal affect    Signed, Shirlee More, MD  05/31/2021 1:04 PM    [facility]

## 2021-05-31 NOTE — Patient Instructions (Addendum)
Medication Instructions:  ? ? STOP TAKING DILTIAZEM 240 MG  ? ?*If you need a refill on your cardiac medications before your next appointment, please call your pharmacy* ? ? ?Lab Work: TSH  FREE T4 FREE T3 CBC AND CMP  ? ?If you have labs (blood work) drawn today and your tests are completely normal, you will receive your results only by: ?MyChart Message (if you have MyChart) OR ?A paper copy in the mail ?If you have any lab test that is abnormal or we need to change your treatment, we will call you to review the results. ? ? ?Testing/Procedures: NONE ORDERED  TODAY ? ? ?Follow-Up: ?At [facility], you and your health needs are our priority.  As part of our continuing mission to provide you with exceptional heart care, we have created designated Provider Care Teams.  These Care Teams include your primary Cardiologist (physician) and Advanced Practice Providers (APPs -  Physician Assistants and Nurse Practitioners) who all work together to provide you with the care you need, when you need it. ? ?We recommend signing up for the patient portal called "MyChart".  Sign up information is provided on this After Visit Summary.  MyChart is used to connect with patients for Virtual Visits (Telemedicine).  Patients are able to view lab/test results, encounter notes, upcoming appointments, etc.  Non-urgent messages can be sent to your provider as well.   ?To learn more about what you can do with MyChart, go to [email or web address].   ? ?Your next appointment:   ?3 month(s) ? ?The format for your next appointment:   ?In Person ? ?Provider:   ?Shirlee More, MD  ? ? ?Other Instructions ? ?MAKE SURE YOU DROP OFF YOUR LIST OF BLOOD PRESSURE  AND HEARTRATE READINGS  ?

## 2021-06-02 LAB — CBC
Hematocrit: 38.4 % (ref 37.5–51.0)
Hemoglobin: 13 g/dL (ref 13.0–17.7)
MCH: 31.2 pg (ref 26.6–33.0)
MCHC: 33.9 g/dL (ref 31.5–35.7)
MCV: 92 fL (ref 79–97)
Platelets: 127 10*3/uL — ABNORMAL LOW (ref 150–450)
RBC: 4.17 x10E6/uL (ref 4.14–5.80)
RDW: 13.2 % (ref 11.6–15.4)
WBC: 5 10*3/uL (ref 3.4–10.8)

## 2021-06-02 LAB — COMPREHENSIVE METABOLIC PANEL
ALT: 14 IU/L (ref 0–44)
AST: 17 IU/L (ref 0–40)
Albumin/Globulin Ratio: 2 (ref 1.2–2.2)
Albumin: 4.4 g/dL (ref 3.5–4.6)
Alkaline Phosphatase: 85 IU/L (ref 44–121)
BUN/Creatinine Ratio: 15 (ref 10–24)
BUN: 17 mg/dL (ref 10–36)
Bilirubin Total: 0.3 mg/dL (ref 0.0–1.2)
CO2: 24 mmol/L (ref 20–29)
Calcium: 9.3 mg/dL (ref 8.6–10.2)
Chloride: 101 mmol/L (ref 96–106)
Creatinine, Ser: 1.14 mg/dL (ref 0.76–1.27)
Globulin, Total: 2.2 g/dL (ref 1.5–4.5)
Glucose: 93 mg/dL (ref 70–99)
Potassium: 4.7 mmol/L (ref 3.5–5.2)
Sodium: 138 mmol/L (ref 134–144)
Total Protein: 6.6 g/dL (ref 6.0–8.5)
eGFR: 59 mL/min/{1.73_m2} — ABNORMAL LOW (ref >59)

## 2021-06-02 LAB — TSH: TSH: 24.3 u[IU]/mL — ABNORMAL HIGH (ref 0.450–4.500)

## 2021-06-02 LAB — T4, FREE: Free T4: 0.78 ng/dL — ABNORMAL LOW (ref 0.82–1.77)

## 2021-06-04 MED ORDER — LEVOTHYROXINE SODIUM 50 MCG PO TABS: 50.0000 ug | ORAL_TABLET | Freq: Every day | ORAL | 1 refills | Status: DC

## 2021-06-04 NOTE — Addendum Note (Signed)
Addended by: Truddie Hidden on: 06/04/2021 08:35 AM ? ? Modules accepted: Orders ? ?

## 2021-06-13 DIAGNOSIS — D638 Anemia in other chronic diseases classified elsewhere: Secondary | ICD-10-CM | POA: Diagnosis not present

## 2021-06-13 DIAGNOSIS — G8191 Hemiplegia, unspecified affecting right dominant side: Secondary | ICD-10-CM | POA: Diagnosis not present

## 2021-06-13 DIAGNOSIS — R600 Localized edema: Secondary | ICD-10-CM | POA: Diagnosis not present

## 2021-06-13 DIAGNOSIS — E785 Hyperlipidemia, unspecified: Secondary | ICD-10-CM | POA: Diagnosis not present

## 2021-06-13 DIAGNOSIS — D519 Vitamin B12 deficiency anemia, unspecified: Secondary | ICD-10-CM | POA: Diagnosis not present

## 2021-06-13 DIAGNOSIS — I10 Essential (primary) hypertension: Secondary | ICD-10-CM | POA: Diagnosis not present

## 2021-06-13 DIAGNOSIS — I471 Supraventricular tachycardia: Secondary | ICD-10-CM | POA: Diagnosis not present

## 2021-06-13 DIAGNOSIS — E039 Hypothyroidism, unspecified: Secondary | ICD-10-CM | POA: Diagnosis not present

## 2021-06-22 ENCOUNTER — Telehealth: Payer: Self-pay | Admitting: *Deleted

## 2021-06-22 NOTE — Telephone Encounter (Signed)
Let pt know that Dr. Bettina Gavia looked over his BP log and it looks good, no changes. Pt verbalized understanding ?

## 2021-07-04 DIAGNOSIS — H353131 Nonexudative age-related macular degeneration, bilateral, early dry stage: Secondary | ICD-10-CM | POA: Diagnosis not present

## 2021-07-04 DIAGNOSIS — Z961 Presence of intraocular lens: Secondary | ICD-10-CM | POA: Diagnosis not present

## 2021-07-04 DIAGNOSIS — D3131 Benign neoplasm of right choroid: Secondary | ICD-10-CM | POA: Diagnosis not present

## 2021-07-04 DIAGNOSIS — H401133 Primary open-angle glaucoma, bilateral, severe stage: Secondary | ICD-10-CM | POA: Diagnosis not present

## 2021-07-06 DIAGNOSIS — R0981 Nasal congestion: Secondary | ICD-10-CM | POA: Diagnosis not present

## 2021-07-06 DIAGNOSIS — J209 Acute bronchitis, unspecified: Secondary | ICD-10-CM | POA: Diagnosis not present

## 2021-07-06 DIAGNOSIS — R051 Acute cough: Secondary | ICD-10-CM | POA: Diagnosis not present

## 2021-07-16 DIAGNOSIS — D519 Vitamin B12 deficiency anemia, unspecified: Secondary | ICD-10-CM | POA: Diagnosis not present

## 2021-08-08 ENCOUNTER — Ambulatory Visit: Payer: Medicare Other | Admitting: Pulmonary Disease

## 2021-08-09 DIAGNOSIS — G4733 Obstructive sleep apnea (adult) (pediatric): Secondary | ICD-10-CM | POA: Diagnosis not present

## 2021-08-22 DIAGNOSIS — D519 Vitamin B12 deficiency anemia, unspecified: Secondary | ICD-10-CM | POA: Diagnosis not present

## 2021-08-23 ENCOUNTER — Encounter: Payer: Self-pay | Admitting: Pulmonary Disease

## 2021-08-23 ENCOUNTER — Ambulatory Visit: Payer: Medicare Other | Admitting: Pulmonary Disease

## 2021-08-23 VITALS — BP 134/68 | HR 62 | Temp 98.5°F | Ht 72.0 in | Wt 180.4 lb

## 2021-08-23 DIAGNOSIS — Z9989 Dependence on other enabling machines and devices: Secondary | ICD-10-CM | POA: Diagnosis not present

## 2021-08-23 DIAGNOSIS — G4733 Obstructive sleep apnea (adult) (pediatric): Secondary | ICD-10-CM | POA: Diagnosis not present

## 2021-08-23 NOTE — Progress Notes (Signed)
[address] Jimmy Grant    [id number]    June 04, 1924  Primary Care Physician:Schultz, Lora Havens, MD  Referring Physician: Nicoletta Dress, MD [address] [place],  [place] [address]  Chief complaint:   History of obstructive sleep apnea Continues to use CPAP on a nightly basis  HPI:  Has used CPAP since 2017 Currently on CPAP of 11  Had COVID beginning of the month Symptoms of congestion -Did not use any treatment  Continues to tolerate CPAP well Gets daily reports about his CPAP use with AHI always about 1 He did have 1 night when number of events was 17 but could not explain it  States he never sleeps on his back for any length of time known to him  Recently upgraded his machine  Clinically does not feel any different Continues to feel well generally Wakes up feeling like is at a good nights rest  Has not a lot of medication changes  Continues to follow-up with primary doctor  When talked about inspire device during his last visit  He has mild obstructive sleep apnea titrated to a pressure of 9 in the lab Currently on a pressure of 11  Usually goes to bed between 9 and 10 PM Final wake up time about 6 AM  He feels his sleep is restorative He does not wake up to use the bathroom He does have musculoskeletal pains and discomfort but does not feel this is contributing to his awakenings  He started on CPAP secondary to having tachycardia when he wakes up from sleep, this is controlled at present  Continues to keep a good log about his symptoms and CPAP use  Currently uses a full facemask  Outpatient Encounter Medications as of 08/23/2021  Medication Sig   amiodarone (PACERONE) 200 MG tablet Take 200 mg by mouth daily.   aspirin EC 81 MG tablet Take 81 mg by mouth daily.   Azelastine HCl 0.15 % SOLN Place 2 sprays into both nostrils daily.   calcium-vitamin D (OSCAL WITH D) 500-200 MG-UNIT tablet Take 1 tablet by mouth daily.    DIOVAN 160 MG tablet Take 160 mg by mouth 2 (two) times daily.    dorzolamide-timolol (COSOPT) 22.3-6.8 MG/ML ophthalmic solution Place 1 drop into both eyes 2 (two) times daily.   Glucosamine-Chondroitin (GLUCOSAMINE CHONDR COMPLEX PO) Take 1 tablet by mouth daily.   levothyroxine (SYNTHROID) 50 MCG tablet Take 1 tablet (50 mcg total) by mouth daily before breakfast.   Multiple Vitamins-Minerals (PRESERVISION AREDS 2+MULTI VIT) CAPS Take 1 capsule by mouth 2 (two) times daily.   simvastatin (ZOCOR) 20 MG tablet Take 20 mg by mouth every evening.   [DISCONTINUED] diltiazem (DILACOR XR) 120 MG 24 hr capsule Take 120 mg by mouth daily.   No facility-administered encounter medications on file as of 08/23/2021.    Allergies as of 08/23/2021   (No Known Allergies)    Past Medical History:  Diagnosis Date   'light-for-dates' infant with signs of fetal malnutrition 08/19/2016   Acute respiratory disease due to COVID-19 virus 04/07/2019   Anemia 10/17/2017   Branch retinal vein occlusion    Cardiac arrhythmia 07/23/2016   Choroidal nevus, right 11/13/2017   Dyslipidemia    Edema of both legs    Glaucoma    History of prostate cancer    Hypercholesterolemia 06/08/2018   Hypertension 07/23/2016   Iritis, recurrent, bilateral 12/09/2013   Late effects of CVA (cerebrovascular  accident)    Leukopenia 10/17/2017   Lumbar disc narrowing    Macular edema 07/16/2012   Malignant neoplasm of prostate ([place]) 03/17/2020   Mixed hyperlipidemia 06/02/2020   OSA on CPAP    Osteoarthritis of right glenohumeral joint    PAC (premature atrial contraction) 06/02/2020   Primary hypertension 06/02/2020   Primary open angle glaucoma (POAG) of both eyes, moderate stage 07/16/2012   Prostate cancer ([facility])    PSVT (paroxysmal supraventricular tachycardia) ([place]) 08/12/2005   PVC (premature ventricular contraction) 06/02/2020   Status post intraocular lens implant 07/16/2012   Thyroid nodule    Vitamin B12 deficiency      Past Surgical History:  Procedure Laterality Date   ADENOIDECTOMY  1931   CATARACT EXTRACTION Bilateral    Left on 05/04/96, Right on 05/08/1993   HEMORROIDECTOMY  11/21/2014   Done by Kendell Bane   PROSTATECTOMY  01/11/1999   Transurethral   TEAR DUCT PROBING Right 07/2018   Tear duct surgery / Dr. Lorina Rabon   VIDEO ASSISTED THORACOSCOPY (VATS)/DECORTICATION  2012    Family History  Problem Relation Age of Onset   Diabetes Mother    Hypertension Mother    Kidney disease Father     Social History   Socioeconomic History   Marital status: Married    Spouse name: Not on file   Number of children: Not on file   Years of education: Not on file   Highest education level: Not on file  Occupational History   Occupation: retired  Tobacco Use   Smoking status: Former    Packs/day: 1.00    Years: 40.00    Pack years: 40.00    Types: Cigarettes    Quit date: 1971    Years since quitting: 52.4   Smokeless tobacco: Never  Substance and Sexual Activity   Alcohol use: Yes    Alcohol/week: 7.0 standard drinks    Types: 7 Glasses of wine per week   Drug use: Never   Sexual activity: Not on file  Other Topics Concern   Not on file  Social History Narrative   Not on file   Social Determinants of Health   Financial Resource Strain: Not on file  Food Insecurity: Not on file  Transportation Needs: Not on file  Physical Activity: Not on file  Stress: Not on file  Social Connections: Not on file  Intimate Partner Violence: Not on file    Review of Systems  Respiratory:  Positive for apnea.   Psychiatric/Behavioral:  Positive for sleep disturbance.    Vitals:   08/23/21 1054  BP: 134/68  Pulse: 62  Temp: 98.5 F (36.9 C)  SpO2: 95%    Physical Exam Constitutional:      Appearance: Normal appearance. He is well-developed.  HENT:     Head: Normocephalic.     Nose: Nose normal.     Mouth/Throat:     Mouth: Mucous membranes are moist.  Neck:     Thyroid: No  thyromegaly.     Trachea: No tracheal deviation.  Cardiovascular:     Rate and Rhythm: Normal rate and regular rhythm.  Pulmonary:     Effort: Pulmonary effort is normal. No respiratory distress.     Breath sounds: Normal breath sounds. No stridor. No wheezing, rhonchi or rales.  Musculoskeletal:     Cervical back: No rigidity or tenderness.  Neurological:     General: No focal deficit present.     Mental Status: He is alert.  Psychiatric:  Mood and Affect: Mood normal.   Data Reviewed: Sleep study from 2017 did reveal mild obstructive sleep apnea Titration study showed he was titrated to a pressure of 9 with residual AHI 1.4 Patient's AHI on this data that he keeps regularly shows AHI fluctuations from 4-13  Most recent compliance data shows AHI 1.8 Average sleep of 7 hours 50 minutes Currently on a pressure of 11  Assessment:  Obstructive sleep apnea -Continue on pressure of 11  He feels well, sleeps well He tolerates CPAP well  Had COVID beginning of this month  Continues to follow-up with his primary doctor as well  Discussion about options of treatment about sleep apnea  Plan/Recommendations: Continue CPAP at 11  Encouraged to call with any significant concerns  CPAP compliance reviewed with the patient  He will continue to require CPAP as he continues to feel better with using it  Follow-up a year from now  I spent 30 minutes dedicated to the care of this patient on the date of this encounter to include previsit review of records, face-to-face time with the patient discussing conditions above, post visit ordering of testing, clinical documentation with electronic health record, making appropriate referrals as documented, and communicated necessary findings to members of the patient's care team   Sherrilyn Rist MD [facility] 08/23/2021, 11:27 AM  CC: Nicoletta Dress, MD

## 2021-08-23 NOTE — Patient Instructions (Signed)
Continue using your CPAP nightly  I will see you a year from now  Call with significant concerns

## 2021-09-12 ENCOUNTER — Ambulatory Visit: Payer: Medicare Other | Admitting: Cardiology

## 2021-09-12 ENCOUNTER — Encounter: Payer: Self-pay | Admitting: Cardiology

## 2021-09-12 VITALS — BP 150/74 | HR 67 | Ht 72.0 in | Wt 180.0 lb

## 2021-09-12 DIAGNOSIS — I493 Ventricular premature depolarization: Secondary | ICD-10-CM | POA: Diagnosis not present

## 2021-09-12 DIAGNOSIS — E039 Hypothyroidism, unspecified: Secondary | ICD-10-CM

## 2021-09-12 DIAGNOSIS — I10 Essential (primary) hypertension: Secondary | ICD-10-CM | POA: Diagnosis not present

## 2021-09-12 DIAGNOSIS — Z79899 Other long term (current) drug therapy: Secondary | ICD-10-CM | POA: Diagnosis not present

## 2021-09-12 MED ORDER — AMIODARONE HCL 200 MG PO TABS: 200.0000 mg | ORAL_TABLET | ORAL | 3 refills | Status: DC

## 2021-09-12 NOTE — Progress Notes (Signed)
Cardiology Office Note:    Date:  09/12/2021   ID:  Jimmy Grant., DOB March 30, 1925, MRN [id number]  PCP:  Nicoletta Dress, MD  Cardiologist:  Shirlee More, MD    Referring MD: Nicoletta Dress, MD    ASSESSMENT:    1. On amiodarone therapy   2. PVC (premature ventricular contraction)   3. Primary hypertension   4. Acquired hypothyroidism    PLAN:    In order of problems listed above:  [name] is doing well with amiodarone to suppress symptomatic PVCs we will decrease to every other day effectively 100 mg daily the minimum effective dose of amiodarone recheck liver function and thyroid today. BP controlled here continue to monitor check at home with good technique and validated device Recheck his thyroids may require higher dose of Synthroid   Next appointment: 6 months   Medication Adjustments/Labs and Tests Ordered: Current medicines are reviewed at length with the patient today.  Concerns regarding medicines are outlined above.  Orders Placed This Encounter  Procedures   Comp Met (CMET)   TSH+T4F+T3Free   EKG 12-Lead   Meds ordered this encounter  Medications   amiodarone (PACERONE) 200 MG tablet    Sig: Take 1 tablet (200 mg total) by mouth every other day.    Dispense:  45 tablet    Refill:  3    Chief Complaint  Patient presents with   Frequent PVCs on amiodarone    History of Present Illness:    Jimmy Grant. is a 88 y.o. male with a hx of frequent PVCs suppressed amiodarone hypertension and hyper lipidemia last seen 05/31/2021.  At that time he felt very weak and fatigued and was hypothyroid with a TSH of 24.3 and a free T4 of 0.78 and he was initiated on thyroid supplementation.  Platelets mildly diminished 127,000  Compliance with diet, lifestyle and medications: Yes  He feels better after starting the thyroid supplement he has not had repeat labs we will do them in the office today and likely requires a higher  dose. Overall feels better less fatigue and attributes much of his fatigue to having had COVID twice despite vaccination. Blood pressure runs in range most of the time 1 16-3 50 systolic He has had no rapid or slow heart rates Overall he is feeling good no edema shortness of breath or syncope. Past Medical History:  Diagnosis Date   'light-for-dates' infant with signs of fetal malnutrition 08/19/2016   Acute respiratory disease due to COVID-19 virus 04/07/2019   Anemia 10/17/2017   Branch retinal vein occlusion    Cardiac arrhythmia 07/23/2016   Choroidal nevus, right 11/13/2017   Dyslipidemia    Edema of both legs    Glaucoma    History of prostate cancer    Hypercholesterolemia 06/08/2018   Hypertension 07/23/2016   Iritis, recurrent, bilateral 12/09/2013   Late effects of CVA (cerebrovascular accident)    Leukopenia 10/17/2017   Lumbar disc narrowing    Macular edema 07/16/2012   Malignant neoplasm of prostate ([place]) 03/17/2020   Mixed hyperlipidemia 06/02/2020   OSA on CPAP    Osteoarthritis of right glenohumeral joint    PAC (premature atrial contraction) 06/02/2020   Primary hypertension 06/02/2020   Primary open angle glaucoma (POAG) of both eyes, moderate stage 07/16/2012   Prostate cancer ([place])    PSVT (paroxysmal supraventricular tachycardia) ([place]) 08/12/2005   PVC (premature ventricular contraction) 06/02/2020   Status post intraocular lens implant 07/16/2012  Thyroid nodule    Vitamin B12 deficiency     Past Surgical History:  Procedure Laterality Date   ADENOIDECTOMY  1931   CATARACT EXTRACTION Bilateral    Left on 05/04/96, Right on 05/08/1993   HEMORROIDECTOMY  11/21/2014   Done by Kendell Bane   PROSTATECTOMY  01/11/1999   Transurethral   TEAR DUCT PROBING Right 07/2018   Tear duct surgery / Dr. Lorina Rabon   VIDEO ASSISTED THORACOSCOPY (VATS)/DECORTICATION  2012    Current Medications: Current Meds  Medication Sig   amiodarone (PACERONE) 200 MG tablet Take 1 tablet (200  mg total) by mouth every other day.   aspirin EC 81 MG tablet Take 81 mg by mouth daily.   Azelastine HCl 0.15 % SOLN Place 2 sprays into both nostrils daily.   calcium-vitamin D (OSCAL WITH D) 500-200 MG-UNIT tablet Take 1 tablet by mouth daily.   DIOVAN 160 MG tablet Take 160 mg by mouth 2 (two) times daily.    dorzolamide-timolol (COSOPT) 22.3-6.8 MG/ML ophthalmic solution Place 1 drop into both eyes 2 (two) times daily.   Glucosamine-Chondroitin (GLUCOSAMINE CHONDR COMPLEX PO) Take 1 tablet by mouth daily.   levothyroxine (SYNTHROID) 50 MCG tablet Take 1 tablet (50 mcg total) by mouth daily before breakfast.   Multiple Vitamins-Minerals (PRESERVISION AREDS 2+MULTI VIT) CAPS Take 1 capsule by mouth 2 (two) times daily.   simvastatin (ZOCOR) 20 MG tablet Take 20 mg by mouth every evening.   [DISCONTINUED] amiodarone (PACERONE) 200 MG tablet Take 200 mg by mouth daily.     Allergies:   Patient has no known allergies.   Social History   Socioeconomic History   Marital status: Married    Spouse name: Not on file   Number of children: Not on file   Years of education: Not on file   Highest education level: Not on file  Occupational History   Occupation: retired  Tobacco Use   Smoking status: Former    Packs/day: 1.00    Years: 40.00    Total pack years: 40.00    Types: Cigarettes    Quit date: 1971    Years since quitting: 52.4    Passive exposure: Past   Smokeless tobacco: Never  Vaping Use   Vaping Use: Never used  Substance and Sexual Activity   Alcohol use: Yes    Alcohol/week: 7.0 standard drinks of alcohol    Types: 7 Glasses of wine per week   Drug use: Never   Sexual activity: Not on file  Other Topics Concern   Not on file  Social History Narrative   Not on file   Social Determinants of Health   Financial Resource Strain: Not on file  Food Insecurity: Not on file  Transportation Needs: Not on file  Physical Activity: Not on file  Stress: Not on file   Social Connections: Not on file     Family History: The patient's family history includes Diabetes in his mother; Hypertension in his mother; Kidney disease in his father. ROS:   Please see the history of present illness.    All other systems reviewed and are negative.  EKGs/Labs/Other Studies Reviewed:    The following studies were reviewed today:  EKG:  EKG ordered today and personally reviewed.  The ekg ordered today demonstrates sinus rhythm left axis deviation nonspecific conduction   Recent Labs: 09/25/2020: Magnesium 2.2 05/31/2021: ALT 14; BUN 17; Creatinine, Ser 1.14; Hemoglobin 13.0; Platelets 127; Potassium 4.7; Sodium 138; TSH 24.300  Recent Lipid  Panel    Component Value Date/Time   TRIG 124 04/07/2019 1039    Physical Exam:    VS:  BP (!) 150/74 (BP Location: Right Arm, Patient Position: Sitting)   Pulse 67   Ht 6' (1.829 m)   Wt 180 lb (81.6 kg)   SpO2 97%   BMI 24.41 kg/m     Wt Readings from Last 3 Encounters:  09/12/21 180 lb (81.6 kg)  08/23/21 180 lb 6.4 oz (81.8 kg)  05/31/21 185 lb (83.9 kg)     GEN: Appears his age well nourished, well developed in no acute distress HEENT: Normal NECK: No JVD; No carotid bruits LYMPHATICS: No lymphadenopathy CARDIAC: RRR, no murmurs, rubs, gallops RESPIRATORY:  Clear to auscultation without rales, wheezing or rhonchi  ABDOMEN: Soft, non-tender, non-distended MUSCULOSKELETAL:  No edema; No deformity  SKIN: Warm and dry NEUROLOGIC:  Alert and oriented x 3 PSYCHIATRIC:  Normal affect    Signed, Shirlee More, MD  09/12/2021 11:48 AM    [place]

## 2021-09-12 NOTE — Patient Instructions (Addendum)
Medication Instructions:  Your physician has recommended you make the following change in your medication:   START: Amiodarone 200 mg every other day  *If you need a refill on your cardiac medications before your next appointment, please call your pharmacy*   Lab Work: Your physician recommends that you return for lab work in:   Labs today: CMP, TSH Free T4 T3  If you have labs (blood work) drawn today and your tests are completely normal, you will receive your results only by: [place] (if you have [place]) OR A paper copy in the mail If you have any lab test that is abnormal or we need to change your treatment, we will call you to review the results.   Testing/Procedures: None   Follow-Up: At [facility], you and your health needs are our priority.  As part of our continuing mission to provide you with exceptional heart care, we have created designated Provider Care Teams.  These Care Teams include your primary Cardiologist (physician) and Advanced Practice Providers (APPs -  Physician Assistants and Nurse Practitioners) who all work together to provide you with the care you need, when you need it.  We recommend signing up for the patient portal called "MyChart".  Sign up information is provided on this After Visit Summary.  MyChart is used to connect with patients for Virtual Visits (Telemedicine).  Patients are able to view lab/test results, encounter notes, upcoming appointments, etc.  Non-urgent messages can be sent to your provider as well.   To learn more about what you can do with MyChart, go to [email or web address].    Your next appointment:   6 month(s)  The format for your next appointment:   In Person  Provider:   Shirlee More, MD    Other Instructions None  Important Information About Sugar

## 2021-09-13 DIAGNOSIS — I471 Supraventricular tachycardia: Secondary | ICD-10-CM | POA: Diagnosis not present

## 2021-09-13 DIAGNOSIS — G8191 Hemiplegia, unspecified affecting right dominant side: Secondary | ICD-10-CM | POA: Diagnosis not present

## 2021-09-13 DIAGNOSIS — E785 Hyperlipidemia, unspecified: Secondary | ICD-10-CM | POA: Diagnosis not present

## 2021-09-13 DIAGNOSIS — R600 Localized edema: Secondary | ICD-10-CM | POA: Diagnosis not present

## 2021-09-13 DIAGNOSIS — D638 Anemia in other chronic diseases classified elsewhere: Secondary | ICD-10-CM | POA: Diagnosis not present

## 2021-09-13 DIAGNOSIS — E039 Hypothyroidism, unspecified: Secondary | ICD-10-CM | POA: Diagnosis not present

## 2021-09-13 DIAGNOSIS — D519 Vitamin B12 deficiency anemia, unspecified: Secondary | ICD-10-CM | POA: Diagnosis not present

## 2021-09-13 DIAGNOSIS — I10 Essential (primary) hypertension: Secondary | ICD-10-CM | POA: Diagnosis not present

## 2021-09-13 LAB — COMPREHENSIVE METABOLIC PANEL
ALT: 18 IU/L (ref 0–44)
AST: 18 IU/L (ref 0–40)
Albumin/Globulin Ratio: 2 (ref 1.2–2.2)
Albumin: 4.3 g/dL (ref 3.5–4.6)
Alkaline Phosphatase: 61 IU/L (ref 44–121)
BUN/Creatinine Ratio: 12 (ref 10–24)
BUN: 12 mg/dL (ref 10–36)
Bilirubin Total: 0.3 mg/dL (ref 0.0–1.2)
CO2: 23 mmol/L (ref 20–29)
Calcium: 9.3 mg/dL (ref 8.6–10.2)
Chloride: 99 mmol/L (ref 96–106)
Creatinine, Ser: 1.02 mg/dL (ref 0.76–1.27)
Globulin, Total: 2.1 g/dL (ref 1.5–4.5)
Glucose: 83 mg/dL (ref 70–99)
Potassium: 4.2 mmol/L (ref 3.5–5.2)
Sodium: 136 mmol/L (ref 134–144)
Total Protein: 6.4 g/dL (ref 6.0–8.5)
eGFR: 68 mL/min/{1.73_m2} (ref >59)

## 2021-09-13 LAB — TSH+T4F+T3FREE
Free T4: 0.99 ng/dL (ref 0.82–1.77)
T3, Free: 1.8 pg/mL — ABNORMAL LOW (ref 2.0–4.4)
TSH: 15.3 u[IU]/mL — ABNORMAL HIGH (ref 0.450–4.500)

## 2021-09-16 NOTE — Progress Notes (Unsigned)
[facility]  [address] [place],  [place]  [address] [phone]  Clinic Day:  09/17/2021  Referring physician: Nicoletta Dress, MD  HISTORY OF PRESENT ILLNESS:  The patient is a 88 y.o. male who I follow for mild leukopenia and anemia.  As his peripheral counts have held stable, they have been followed conservatively.  He comes in today for routine follow-up.  Since his last visit, the patient has been doing well.  He denies having any spontaneous infections as it pertains to his leukopenia.  He denies having any overt forms of blood loss as it pertains to his mild anemia.  Overall, he denies having any changes in his health over these past months.   PHYSICAL EXAM:  Blood pressure (!) 165/73, pulse (!) 59, temperature 98.5 F (36.9 C), resp. rate 16, height 6' (1.829 m), weight 180 lb (81.6 kg), SpO2 96 %. Wt Readings from Last 3 Encounters:  09/17/21 180 lb (81.6 kg)  09/12/21 180 lb (81.6 kg)  08/23/21 180 lb 6.4 oz (81.8 kg)   Body mass index is 24.41 kg/m. Performance status (ECOG): 0 Physical Exam Constitutional:      Appearance: Normal appearance. He is not ill-appearing.  HENT:     Mouth/Throat:     Mouth: Mucous membranes are moist.     Pharynx: Oropharynx is clear. No oropharyngeal exudate or posterior oropharyngeal erythema.  Cardiovascular:     Rate and Rhythm: Normal rate and regular rhythm.     Heart sounds: No murmur heard.    No friction rub. No gallop.  Pulmonary:     Effort: Pulmonary effort is normal. No respiratory distress.     Breath sounds: Normal breath sounds. No wheezing, rhonchi or rales.  Abdominal:     General: Bowel sounds are normal. There is no distension.     Palpations: Abdomen is soft. There is no mass.     Tenderness: There is no abdominal tenderness.  Musculoskeletal:        General: No swelling.     Right lower leg: No edema.     Left lower leg: No edema.  Lymphadenopathy:     Cervical: No  cervical adenopathy.     Upper Body:     Right upper body: No supraclavicular or axillary adenopathy.     Left upper body: No supraclavicular or axillary adenopathy.     Lower Body: No right inguinal adenopathy. No left inguinal adenopathy.  Skin:    General: Skin is warm.     Coloration: Skin is not jaundiced.     Findings: No lesion or rash.  Neurological:     General: No focal deficit present.     Mental Status: He is alert and oriented to person, place, and time. Mental status is at baseline.  Psychiatric:        Mood and Affect: Mood normal.        Behavior: Behavior normal.        Thought Content: Thought content normal.     LABS:       Latest Ref Rng & Units 09/17/2021   12:00 AM 05/31/2021    1:18 PM 03/19/2021   12:00 AM  CBC  WBC  6.0     5.0  5.0      Hemoglobin 13.5 - 17.5 11.5     13.0  11.7      Hematocrit 41 - 53 34     38.4  36  Platelets 150 - 400 K/uL 121     127  123         This result is from an external source.      Latest Ref Rng & Units 09/17/2021   12:00 AM 09/12/2021   11:55 AM 05/31/2021    1:18 PM  CMP  Glucose 70 - 99 mg/dL  83  93   BUN 4 - '21 18     12  17   '$ Creatinine 0.6 - 1.3 1.1     1.02  1.14   Sodium 137 - 147 137     136  138   Potassium 3.5 - 5.1 mEq/L 4.4     4.2  4.7   Chloride 99 - 108 103     99  101   CO2 13 - '22 25     23  24   '$ Calcium 8.7 - 10.7 8.8     9.3  9.3   Total Protein 6.0 - 8.5 g/dL  6.4  6.6   Total Bilirubin 0.0 - 1.2 mg/dL  0.3  0.3   Alkaline Phos 25 - 125 51     61  85   AST 14 - 40 '23     18  17   '$ ALT 10 - 40 U/L '22     18  14      '$ This result is from an external source.    ASSESSMENT & PLAN:  Assessment/Plan:  A 88 y.o. male with mild leukopenia and anemia.  When evaluating his labs today, his white cell remains normal.  His hemoglobin is mildly low, but stable.  He currently has mild thrombocytopenia, which will continue to be followed conservatively.  Clinically, patient is doing well.  I will see  him back in 6 months for repeat clinical assessment.  The patient understands all the plans discussed today and is in agreement with them.    Rashiya Lofland Macarthur Critchley, MD

## 2021-09-17 ENCOUNTER — Inpatient Hospital Stay: Payer: Medicare Other

## 2021-09-17 ENCOUNTER — Other Ambulatory Visit: Payer: Self-pay

## 2021-09-17 ENCOUNTER — Inpatient Hospital Stay: Payer: Medicare Other | Attending: Oncology | Admitting: Oncology

## 2021-09-17 ENCOUNTER — Other Ambulatory Visit: Payer: Self-pay | Admitting: Oncology

## 2021-09-17 VITALS — BP 165/73 | HR 59 | Temp 98.5°F | Resp 16 | Ht 72.0 in | Wt 180.0 lb

## 2021-09-17 DIAGNOSIS — D696 Thrombocytopenia, unspecified: Secondary | ICD-10-CM | POA: Insufficient documentation

## 2021-09-17 DIAGNOSIS — D539 Nutritional anemia, unspecified: Secondary | ICD-10-CM

## 2021-09-17 DIAGNOSIS — D72819 Decreased white blood cell count, unspecified: Secondary | ICD-10-CM | POA: Insufficient documentation

## 2021-09-17 DIAGNOSIS — Z79899 Other long term (current) drug therapy: Secondary | ICD-10-CM | POA: Diagnosis not present

## 2021-09-17 DIAGNOSIS — D708 Other neutropenia: Secondary | ICD-10-CM | POA: Diagnosis not present

## 2021-09-17 DIAGNOSIS — D649 Anemia, unspecified: Secondary | ICD-10-CM | POA: Diagnosis not present

## 2021-09-17 LAB — IRON AND TIBC
Iron: 66 ug/dL (ref 45–182)
Saturation Ratios: 22 % (ref 17.9–39.5)
TIBC: 303 ug/dL (ref 250–450)
UIBC: 237 ug/dL

## 2021-09-17 LAB — COMPREHENSIVE METABOLIC PANEL
Albumin: 4.1 (ref 3.5–5.0)
Calcium: 8.8 (ref 8.7–10.7)

## 2021-09-17 LAB — CBC AND DIFFERENTIAL
HCT: 34 — AB (ref 41–53)
Hemoglobin: 11.5 — AB (ref 13.5–17.5)
Neutrophils Absolute: 3.54
Platelets: 121 10*3/uL — AB (ref 150–400)
WBC: 6

## 2021-09-17 LAB — VITAMIN B12: Vitamin B-12: 439 pg/mL (ref 180–914)

## 2021-09-17 LAB — BASIC METABOLIC PANEL
BUN: 18 (ref 4–21)
CO2: 25 — AB (ref 13–22)
Chloride: 103 (ref 99–108)
Creatinine: 1.1 (ref 0.6–1.3)
Glucose: 92
Potassium: 4.4 mEq/L (ref 3.5–5.1)
Sodium: 137 (ref 137–147)

## 2021-09-17 LAB — HEPATIC FUNCTION PANEL
ALT: 22 U/L (ref 10–40)
AST: 23 (ref 14–40)
Alkaline Phosphatase: 51 (ref 25–125)
Bilirubin, Total: 0.5

## 2021-09-17 LAB — CBC: RBC: 3.74 — AB (ref 3.87–5.11)

## 2021-09-17 LAB — FERRITIN: Ferritin: 22 ng/mL — ABNORMAL LOW (ref 24–336)

## 2021-09-17 LAB — FOLATE: Folate: 6 ng/mL (ref >5.9)

## 2021-09-19 ENCOUNTER — Telehealth: Payer: Self-pay | Admitting: Cardiology

## 2021-09-19 NOTE — Telephone Encounter (Signed)
Left VM for pt to call back.

## 2021-09-19 NOTE — Telephone Encounter (Signed)
Patient was returning call. Please advise ?

## 2021-09-20 ENCOUNTER — Other Ambulatory Visit: Payer: Self-pay

## 2021-09-20 NOTE — Telephone Encounter (Signed)
Patient informed of results.  

## 2021-09-24 DIAGNOSIS — D519 Vitamin B12 deficiency anemia, unspecified: Secondary | ICD-10-CM | POA: Diagnosis not present

## 2021-10-25 DIAGNOSIS — E039 Hypothyroidism, unspecified: Secondary | ICD-10-CM | POA: Diagnosis not present

## 2021-10-25 DIAGNOSIS — D519 Vitamin B12 deficiency anemia, unspecified: Secondary | ICD-10-CM | POA: Diagnosis not present

## 2021-11-29 DIAGNOSIS — E039 Hypothyroidism, unspecified: Secondary | ICD-10-CM | POA: Diagnosis not present

## 2021-11-29 DIAGNOSIS — D519 Vitamin B12 deficiency anemia, unspecified: Secondary | ICD-10-CM | POA: Diagnosis not present

## 2021-12-10 DIAGNOSIS — G4733 Obstructive sleep apnea (adult) (pediatric): Secondary | ICD-10-CM | POA: Diagnosis not present

## 2021-12-17 DIAGNOSIS — G8191 Hemiplegia, unspecified affecting right dominant side: Secondary | ICD-10-CM | POA: Diagnosis not present

## 2021-12-17 DIAGNOSIS — R600 Localized edema: Secondary | ICD-10-CM | POA: Diagnosis not present

## 2021-12-17 DIAGNOSIS — D519 Vitamin B12 deficiency anemia, unspecified: Secondary | ICD-10-CM | POA: Diagnosis not present

## 2021-12-17 DIAGNOSIS — D638 Anemia in other chronic diseases classified elsewhere: Secondary | ICD-10-CM | POA: Diagnosis not present

## 2021-12-17 DIAGNOSIS — I471 Supraventricular tachycardia: Secondary | ICD-10-CM | POA: Diagnosis not present

## 2021-12-17 DIAGNOSIS — E039 Hypothyroidism, unspecified: Secondary | ICD-10-CM | POA: Diagnosis not present

## 2021-12-17 DIAGNOSIS — E785 Hyperlipidemia, unspecified: Secondary | ICD-10-CM | POA: Diagnosis not present

## 2021-12-17 DIAGNOSIS — I10 Essential (primary) hypertension: Secondary | ICD-10-CM | POA: Diagnosis not present

## 2021-12-24 ENCOUNTER — Other Ambulatory Visit: Payer: Self-pay

## 2021-12-24 MED ORDER — AMIODARONE HCL 200 MG PO TABS: 200.0000 mg | ORAL_TABLET | Freq: Every day | ORAL | 3 refills | Status: DC

## 2021-12-25 DIAGNOSIS — Z23 Encounter for immunization: Secondary | ICD-10-CM | POA: Diagnosis not present

## 2022-01-03 DIAGNOSIS — D519 Vitamin B12 deficiency anemia, unspecified: Secondary | ICD-10-CM | POA: Diagnosis not present

## 2022-01-29 DIAGNOSIS — I10 Essential (primary) hypertension: Secondary | ICD-10-CM | POA: Diagnosis not present

## 2022-01-29 DIAGNOSIS — E785 Hyperlipidemia, unspecified: Secondary | ICD-10-CM | POA: Diagnosis not present

## 2022-02-05 DIAGNOSIS — D519 Vitamin B12 deficiency anemia, unspecified: Secondary | ICD-10-CM | POA: Diagnosis not present

## 2022-02-28 DIAGNOSIS — G4733 Obstructive sleep apnea (adult) (pediatric): Secondary | ICD-10-CM | POA: Diagnosis not present

## 2022-03-07 DIAGNOSIS — D519 Vitamin B12 deficiency anemia, unspecified: Secondary | ICD-10-CM | POA: Diagnosis not present

## 2022-03-17 NOTE — Progress Notes (Signed)
[facility]  [address] [place],  [place]  [address] [phone]  Clinic Day:  03/18/2022  Referring physician: Nicoletta Dress, MD  HISTORY OF PRESENT ILLNESS:  The patient is a 88 y.o. male who I follow for mild leukopenia and anemia.  As his peripheral counts have held stable, they have been followed conservatively.  He comes in today for routine follow-up.  Since his last visit, the patient has been doing well.  He denies having any spontaneous infections as it pertains to his leukopenia.  He denies having any overt forms of blood loss as it pertains to his mild anemia.    PHYSICAL EXAM:  Blood pressure (!) 172/76, pulse 72, temperature 97.8 F (36.6 C), resp. rate 16, height 6' (1.829 m), weight 180 lb 8 oz (81.9 kg), SpO2 95 %. Wt Readings from Last 3 Encounters:  03/18/22 180 lb 8 oz (81.9 kg)  09/17/21 180 lb (81.6 kg)  09/12/21 180 lb (81.6 kg)   Body mass index is 24.48 kg/m. Performance status (ECOG): 0 Physical Exam Constitutional:      Appearance: Normal appearance. He is not ill-appearing.  HENT:     Mouth/Throat:     Mouth: Mucous membranes are moist.     Pharynx: Oropharynx is clear. No oropharyngeal exudate or posterior oropharyngeal erythema.  Cardiovascular:     Rate and Rhythm: Normal rate and regular rhythm.     Heart sounds: No murmur heard.    No friction rub. No gallop.  Pulmonary:     Effort: Pulmonary effort is normal. No respiratory distress.     Breath sounds: Normal breath sounds. No wheezing, rhonchi or rales.  Abdominal:     General: Bowel sounds are normal. There is no distension.     Palpations: Abdomen is soft. There is no mass.     Tenderness: There is no abdominal tenderness.  Musculoskeletal:        General: No swelling.     Right lower leg: No edema.     Left lower leg: No edema.  Lymphadenopathy:     Cervical: No cervical adenopathy.     Upper Body:     Right upper body: No supraclavicular  or axillary adenopathy.     Left upper body: No supraclavicular or axillary adenopathy.     Lower Body: No right inguinal adenopathy. No left inguinal adenopathy.  Skin:    General: Skin is warm.     Coloration: Skin is not jaundiced.     Findings: No lesion or rash.  Neurological:     General: No focal deficit present.     Mental Status: He is alert and oriented to person, place, and time. Mental status is at baseline.  Psychiatric:        Mood and Affect: Mood normal.        Behavior: Behavior normal.        Thought Content: Thought content normal.    LABS:       Latest Ref Rng & Units 03/18/2022   10:06 AM 09/17/2021   12:00 AM 05/31/2021    1:18 PM  CBC  WBC 4.0 - 10.5 K/uL 5.4  6.0     5.0   Hemoglobin 13.0 - 17.0 g/dL 11.9  11.5     13.0   Hematocrit 39.0 - 52.0 % 37.1  34     38.4   Platelets 150 - 400 K/uL 140  121     127  This result is from an external source.      Latest Ref Rng & Units 09/17/2021   12:00 AM 09/12/2021   11:55 AM 05/31/2021    1:18 PM  CMP  Glucose 70 - 99 mg/dL  83  93   BUN 4 - '21 18     12  17   '$ Creatinine 0.6 - 1.3 1.1     1.02  1.14   Sodium 137 - 147 137     136  138   Potassium 3.5 - 5.1 mEq/L 4.4     4.2  4.7   Chloride 99 - 108 103     99  101   CO2 13 - '22 25     23  24   '$ Calcium 8.7 - 10.7 8.8     9.3  9.3   Total Protein 6.0 - 8.5 g/dL  6.4  6.6   Total Bilirubin 0.0 - 1.2 mg/dL  0.3  0.3   Alkaline Phos 25 - 125 51     61  85   AST 14 - 40 '23     18  17   '$ ALT 10 - 40 U/L '22     18  14      '$ This result is from an external source.   ASSESSMENT & PLAN:  Assessment/Plan:  A 88 y.o. male with mild leukopenia and anemia.  When evaluating his labs today, his white cell remains normal.  His hemoglobin is mildly low, but improved.  His thrombocytopenia is now low normal.  Overall, I am pleased with his peripheral counts, which will continue to be followed conservatively.  Clinically, patient is doing well.  I will see him back in 6  months for repeat clinical assessment.  The patient understands all the plans discussed today and is in agreement with them.    Rupert Azzara Macarthur Critchley, MD

## 2022-03-18 ENCOUNTER — Other Ambulatory Visit: Payer: Self-pay | Admitting: Oncology

## 2022-03-18 ENCOUNTER — Inpatient Hospital Stay: Payer: Medicare Other

## 2022-03-18 ENCOUNTER — Inpatient Hospital Stay: Payer: Medicare Other | Attending: Oncology | Admitting: Oncology

## 2022-03-18 ENCOUNTER — Telehealth: Payer: Self-pay | Admitting: Oncology

## 2022-03-18 VITALS — BP 172/76 | HR 72 | Temp 97.8°F | Resp 16 | Ht 72.0 in | Wt 180.5 lb

## 2022-03-18 DIAGNOSIS — I10 Essential (primary) hypertension: Secondary | ICD-10-CM | POA: Diagnosis not present

## 2022-03-18 DIAGNOSIS — D72819 Decreased white blood cell count, unspecified: Secondary | ICD-10-CM | POA: Insufficient documentation

## 2022-03-18 DIAGNOSIS — D649 Anemia, unspecified: Secondary | ICD-10-CM | POA: Insufficient documentation

## 2022-03-18 DIAGNOSIS — E785 Hyperlipidemia, unspecified: Secondary | ICD-10-CM | POA: Diagnosis not present

## 2022-03-18 DIAGNOSIS — D539 Nutritional anemia, unspecified: Secondary | ICD-10-CM

## 2022-03-18 DIAGNOSIS — G8191 Hemiplegia, unspecified affecting right dominant side: Secondary | ICD-10-CM | POA: Diagnosis not present

## 2022-03-18 DIAGNOSIS — I471 Supraventricular tachycardia, unspecified: Secondary | ICD-10-CM | POA: Diagnosis not present

## 2022-03-18 DIAGNOSIS — R600 Localized edema: Secondary | ICD-10-CM | POA: Diagnosis not present

## 2022-03-18 DIAGNOSIS — D519 Vitamin B12 deficiency anemia, unspecified: Secondary | ICD-10-CM | POA: Diagnosis not present

## 2022-03-18 DIAGNOSIS — E039 Hypothyroidism, unspecified: Secondary | ICD-10-CM | POA: Diagnosis not present

## 2022-03-18 DIAGNOSIS — I6789 Other cerebrovascular disease: Secondary | ICD-10-CM | POA: Diagnosis not present

## 2022-03-18 DIAGNOSIS — D638 Anemia in other chronic diseases classified elsewhere: Secondary | ICD-10-CM | POA: Diagnosis not present

## 2022-03-18 LAB — CBC WITH DIFFERENTIAL (CANCER CENTER ONLY)
Abs Immature Granulocytes: 0.05 10*3/uL (ref 0.00–0.07)
Basophils Absolute: 0 10*3/uL (ref 0.0–0.1)
Basophils Relative: 0 %
Eosinophils Absolute: 0 10*3/uL (ref 0.0–0.5)
Eosinophils Relative: 0 %
HCT: 37.1 % — ABNORMAL LOW (ref 39.0–52.0)
Hemoglobin: 11.9 g/dL — ABNORMAL LOW (ref 13.0–17.0)
Immature Granulocytes: 1 %
Lymphocytes Relative: 19 %
Lymphs Abs: 1 10*3/uL (ref 0.7–4.0)
MCH: 30.7 pg (ref 26.0–34.0)
MCHC: 32.1 g/dL (ref 30.0–36.0)
MCV: 95.9 fL (ref 80.0–100.0)
Monocytes Absolute: 1.2 10*3/uL — ABNORMAL HIGH (ref 0.1–1.0)
Monocytes Relative: 22 %
Neutro Abs: 3.1 10*3/uL (ref 1.7–7.7)
Neutrophils Relative %: 58 %
Platelet Count: 140 10*3/uL — ABNORMAL LOW (ref 150–400)
RBC: 3.87 MIL/uL — ABNORMAL LOW (ref 4.22–5.81)
RDW: 13.8 % (ref 11.5–15.5)
WBC Count: 5.4 10*3/uL (ref 4.0–10.5)
nRBC: 0 % (ref 0.0–0.2)

## 2022-03-18 NOTE — Telephone Encounter (Signed)
03/18/22 next appt scheduled and confirmed with patient

## 2022-03-22 ENCOUNTER — Ambulatory Visit: Payer: Medicare Other | Attending: Cardiology | Admitting: Cardiology

## 2022-03-22 ENCOUNTER — Encounter: Payer: Self-pay | Admitting: Cardiology

## 2022-03-22 ENCOUNTER — Other Ambulatory Visit: Payer: Self-pay | Admitting: Cardiology

## 2022-03-22 VITALS — BP 138/72 | HR 78 | Ht 72.0 in | Wt 179.0 lb

## 2022-03-22 DIAGNOSIS — E782 Mixed hyperlipidemia: Secondary | ICD-10-CM | POA: Diagnosis not present

## 2022-03-22 DIAGNOSIS — D539 Nutritional anemia, unspecified: Secondary | ICD-10-CM

## 2022-03-22 DIAGNOSIS — I10 Essential (primary) hypertension: Secondary | ICD-10-CM | POA: Diagnosis not present

## 2022-03-22 DIAGNOSIS — Z79899 Other long term (current) drug therapy: Secondary | ICD-10-CM | POA: Diagnosis not present

## 2022-03-22 DIAGNOSIS — E039 Hypothyroidism, unspecified: Secondary | ICD-10-CM | POA: Diagnosis not present

## 2022-03-22 DIAGNOSIS — I493 Ventricular premature depolarization: Secondary | ICD-10-CM | POA: Diagnosis not present

## 2022-03-22 NOTE — Patient Instructions (Signed)
Medication Instructions:  Your physician recommends that you continue on your current medications as directed. Please refer to the Current Medication list given to you today.  *If you need a refill on your cardiac medications before your next appointment, please call your pharmacy*   Lab Work: NONE If you have labs (blood work) drawn today and your tests are completely normal, you will receive your results only by: MyChart Message (if you have MyChart) OR A paper copy in the mail If you have any lab test that is abnormal or we need to change your treatment, we will call you to review the results.   Testing/Procedures: NONE   Follow-Up: At [facility], you and your health needs are our priority.  As part of our continuing mission to provide you with exceptional heart care, we have created designated Provider Care Teams.  These Care Teams include your primary Cardiologist (physician) and Advanced Practice Providers (APPs -  Physician Assistants and Nurse Practitioners) who all work together to provide you with the care you need, when you need it.  We recommend signing up for the patient portal called "MyChart".  Sign up information is provided on this After Visit Summary.  MyChart is used to connect with patients for Virtual Visits (Telemedicine).  Patients are able to view lab/test results, encounter notes, upcoming appointments, etc.  Non-urgent messages can be sent to your provider as well.   To learn more about what you can do with MyChart, go to [email or web address].    Your next appointment:   6 month(s)  The format for your next appointment:   In Person  Provider:   Brian Munley, MD    Other Instructions   Important Information About Sugar       

## 2022-03-22 NOTE — Progress Notes (Signed)
Cardiology Office Note:    Date:  03/22/2022   ID:  Jimmy Grant., DOB 28-Mar-1925, MRN [id number]  PCP:  Nicoletta Dress, MD  Cardiologist:  Shirlee More, MD    Referring MD: Nicoletta Dress, MD    ASSESSMENT:    1. PVC (premature ventricular contraction)   2. On amiodarone therapy   3. Acquired hypothyroidism   4. Primary hypertension    PLAN:    In order of problems listed above:  Continues to do well nice suppression of his symptomatic PVCs and will continue low-dose amiodarone 200 mg daily.  He is not on beta-blocker with previous bradycardia Thyroid is repleted normal TSH continue current thyroid supplement Blood pressure is at target he will continue his ARB LDL at target continue with statin   Next appointment: 6 months    Medication Adjustments/Labs and Tests Ordered: Current medicines are reviewed at length with the patient today.  Concerns regarding medicines are outlined above.  Orders Placed This Encounter  Procedures   EKG 12-Lead   No orders of the defined types were placed in this encounter.   Chief Complaint  Patient presents with   Follow-up    He is on amiodarone to suppress PVCs and developed hypothyroidism    History of Present Illness:    Jimmy Grant. is a 88 y.o. male with a hx of frequent PVCs suppressed with amiodarone hypertension and hyperlipidemia last seen 05/31/2021 his TSH was markedly elevated at 24.3 consistent with hypothyroidism and with bradycardia his calcium channel blocker was discontinued.  Compliance with diet, lifestyle and medications: Yes  Jimmy Grant continues to do well with his PVCs he is having nonsymptomatic tolerates his low-dose amiodarone and weakness and fatigue have resolved with thyroid supplement He has had no palpitations syncope edema shortness of breath. Most recent labs 03/18/2022 TSH normal 5.4 hemoglobin 11.7 creatinine mildly elevated 1.28 potassium 4.6 LDL 60. Past Medical  History:  Diagnosis Date   'light-for-dates' infant with signs of fetal malnutrition 08/19/2016   Acute respiratory disease due to COVID-19 virus 04/07/2019   Anemia 10/17/2017   Branch retinal vein occlusion    Cardiac arrhythmia 07/23/2016   Choroidal nevus, right 11/13/2017   Dyslipidemia    Edema of both legs    Glaucoma    History of prostate cancer    Hypercholesterolemia 06/08/2018   Hypertension 07/23/2016   Iritis, recurrent, bilateral 12/09/2013   Late effects of CVA (cerebrovascular accident)    Leukopenia 10/17/2017   Lumbar disc narrowing    Macular edema 07/16/2012   Malignant neoplasm of prostate ([place]) 03/17/2020   Mixed hyperlipidemia 06/02/2020   OSA on CPAP    Osteoarthritis of right glenohumeral joint    PAC (premature atrial contraction) 06/02/2020   Primary hypertension 06/02/2020   Primary open angle glaucoma (POAG) of both eyes, moderate stage 07/16/2012   Prostate cancer ([place])    PSVT (paroxysmal supraventricular tachycardia) 08/12/2005   PVC (premature ventricular contraction) 06/02/2020   Status post intraocular lens implant 07/16/2012   Thyroid nodule    Vitamin B12 deficiency     Past Surgical History:  Procedure Laterality Date   ADENOIDECTOMY  1931   CATARACT EXTRACTION Bilateral    Left on 05/04/96, Right on 05/08/1993   HEMORROIDECTOMY  11/21/2014   Done by Kendell Bane   PROSTATECTOMY  01/11/1999   Transurethral   TEAR DUCT PROBING Right 07/2018   Tear duct surgery / Dr. Lorina Rabon   VIDEO ASSISTED THORACOSCOPY (VATS)/DECORTICATION  2012    Current Medications: Current Meds  Medication Sig   amiodarone (PACERONE) 200 MG tablet Take 1 tablet (200 mg total) by mouth daily.   aspirin EC 81 MG tablet Take 81 mg by mouth daily.   Azelastine HCl 0.15 % SOLN Place 2 sprays into both nostrils daily.   calcium-vitamin D (OSCAL WITH D) 500-200 MG-UNIT tablet Take 1 tablet by mouth daily.   DIOVAN 160 MG tablet Take 160 mg by mouth 2 (two) times daily.     dorzolamide-timolol (COSOPT) 22.3-6.8 MG/ML ophthalmic solution Place 1 drop into both eyes 2 (two) times daily.   Glucosamine-Chondroitin (GLUCOSAMINE CHONDR COMPLEX PO) Take 1 tablet by mouth daily.   levothyroxine (SYNTHROID) 125 MCG tablet Take 125 mcg by mouth daily.   Multiple Vitamins-Minerals (PRESERVISION AREDS 2+MULTI VIT) CAPS Take 1 capsule by mouth 2 (two) times daily.   simvastatin (ZOCOR) 20 MG tablet Take 20 mg by mouth every evening.     Allergies:   Patient has no known allergies.   Social History   Socioeconomic History   Marital status: Married    Spouse name: Not on file   Number of children: Not on file   Years of education: Not on file   Highest education level: Not on file  Occupational History   Occupation: retired  Tobacco Use   Smoking status: Former    Packs/day: 1.00    Years: 40.00    Total pack years: 40.00    Types: Cigarettes    Quit date: 1971    Years since quitting: 53.0    Passive exposure: Past   Smokeless tobacco: Never  Vaping Use   Vaping Use: Never used  Substance and Sexual Activity   Alcohol use: Yes    Alcohol/week: 7.0 standard drinks of alcohol    Types: 7 Glasses of wine per week   Drug use: Never   Sexual activity: Not on file  Other Topics Concern   Not on file  Social History Narrative   Not on file   Social Determinants of Health   Financial Resource Strain: Not on file  Food Insecurity: Not on file  Transportation Needs: Not on file  Physical Activity: Not on file  Stress: Not on file  Social Connections: Not on file     Family History: The patient's family history includes Diabetes in his mother; Hypertension in his mother; Kidney disease in his father. ROS:   Please see the history of present illness.    All other systems reviewed and are negative.  EKGs/Labs/Other Studies Reviewed:    The following studies were reviewed today:  EKG:  EKG ordered today and personally reviewed.  The ekg ordered today  demonstrates sinus rhythm nonspecific conduction delay no PVCs  Recent Labs: 09/12/2021: TSH 15.300 09/17/2021: ALT 22; BUN 18; Creatinine 1.1; Potassium 4.4; Sodium 137 03/18/2022: Hemoglobin 11.9; Platelet Count 140  Recent Lipid Panel    Component Value Date/Time   TRIG 124 04/07/2019 1039    Physical Exam:    VS:  BP 138/72 (BP Location: Right Arm, Patient Position: Sitting, Cuff Size: Normal)   Pulse 78   Ht 6' (1.829 m)   Wt 179 lb (81.2 kg)   SpO2 97%   BMI 24.28 kg/m     Wt Readings from Last 3 Encounters:  03/22/22 179 lb (81.2 kg)  03/18/22 180 lb 8 oz (81.9 kg)  09/17/21 180 lb (81.6 kg)     GEN: Looks younger than his age well  nourished, well developed in no acute distress HEENT: Normal NECK: No JVD; No carotid bruits LYMPHATICS: No lymphadenopathy CARDIAC: RRR, no murmurs, rubs, gallops RESPIRATORY:  Clear to auscultation without rales, wheezing or rhonchi  ABDOMEN: Soft, non-tender, non-distended MUSCULOSKELETAL:  No edema; No deformity  SKIN: Warm and dry NEUROLOGIC:  Alert and oriented x 3 PSYCHIATRIC:  Normal affect    Signed, Shirlee More, MD  03/22/2022 4:32 PM    [facility]

## 2022-04-09 DIAGNOSIS — D519 Vitamin B12 deficiency anemia, unspecified: Secondary | ICD-10-CM | POA: Diagnosis not present

## 2022-05-08 DIAGNOSIS — D519 Vitamin B12 deficiency anemia, unspecified: Secondary | ICD-10-CM | POA: Diagnosis not present

## 2022-06-10 DIAGNOSIS — J208 Acute bronchitis due to other specified organisms: Secondary | ICD-10-CM | POA: Diagnosis not present

## 2022-06-11 DIAGNOSIS — D519 Vitamin B12 deficiency anemia, unspecified: Secondary | ICD-10-CM | POA: Diagnosis not present

## 2022-06-17 DIAGNOSIS — D638 Anemia in other chronic diseases classified elsewhere: Secondary | ICD-10-CM | POA: Diagnosis not present

## 2022-06-17 DIAGNOSIS — E785 Hyperlipidemia, unspecified: Secondary | ICD-10-CM | POA: Diagnosis not present

## 2022-06-17 DIAGNOSIS — I10 Essential (primary) hypertension: Secondary | ICD-10-CM | POA: Diagnosis not present

## 2022-06-17 DIAGNOSIS — G8191 Hemiplegia, unspecified affecting right dominant side: Secondary | ICD-10-CM | POA: Diagnosis not present

## 2022-06-17 DIAGNOSIS — R600 Localized edema: Secondary | ICD-10-CM | POA: Diagnosis not present

## 2022-06-17 DIAGNOSIS — I6789 Other cerebrovascular disease: Secondary | ICD-10-CM | POA: Diagnosis not present

## 2022-06-17 DIAGNOSIS — I471 Supraventricular tachycardia, unspecified: Secondary | ICD-10-CM | POA: Diagnosis not present

## 2022-06-17 DIAGNOSIS — E039 Hypothyroidism, unspecified: Secondary | ICD-10-CM | POA: Diagnosis not present

## 2022-06-17 DIAGNOSIS — D519 Vitamin B12 deficiency anemia, unspecified: Secondary | ICD-10-CM | POA: Diagnosis not present

## 2022-06-21 DIAGNOSIS — G4733 Obstructive sleep apnea (adult) (pediatric): Secondary | ICD-10-CM | POA: Diagnosis not present

## 2022-07-10 DIAGNOSIS — H401133 Primary open-angle glaucoma, bilateral, severe stage: Secondary | ICD-10-CM | POA: Diagnosis not present

## 2022-07-10 DIAGNOSIS — D3131 Benign neoplasm of right choroid: Secondary | ICD-10-CM | POA: Diagnosis not present

## 2022-07-10 DIAGNOSIS — Z961 Presence of intraocular lens: Secondary | ICD-10-CM | POA: Diagnosis not present

## 2022-07-16 DIAGNOSIS — D519 Vitamin B12 deficiency anemia, unspecified: Secondary | ICD-10-CM | POA: Diagnosis not present

## 2022-08-09 DIAGNOSIS — G4733 Obstructive sleep apnea (adult) (pediatric): Secondary | ICD-10-CM | POA: Diagnosis not present

## 2022-08-15 DIAGNOSIS — D519 Vitamin B12 deficiency anemia, unspecified: Secondary | ICD-10-CM | POA: Diagnosis not present

## 2022-09-03 DIAGNOSIS — H401133 Primary open-angle glaucoma, bilateral, severe stage: Secondary | ICD-10-CM | POA: Diagnosis not present

## 2022-09-17 ENCOUNTER — Other Ambulatory Visit: Payer: Medicare Other

## 2022-09-17 ENCOUNTER — Inpatient Hospital Stay: Payer: Medicare Other | Admitting: Oncology

## 2022-09-17 ENCOUNTER — Ambulatory Visit: Payer: Medicare Other | Admitting: Oncology

## 2022-09-17 ENCOUNTER — Other Ambulatory Visit: Payer: Self-pay | Admitting: Oncology

## 2022-09-17 ENCOUNTER — Inpatient Hospital Stay: Payer: Medicare Other | Attending: Oncology

## 2022-09-17 VITALS — BP 143/65 | HR 72 | Temp 97.5°F | Resp 16 | Ht 72.0 in | Wt 173.2 lb

## 2022-09-17 DIAGNOSIS — D539 Nutritional anemia, unspecified: Secondary | ICD-10-CM

## 2022-09-17 DIAGNOSIS — D649 Anemia, unspecified: Secondary | ICD-10-CM | POA: Diagnosis not present

## 2022-09-17 LAB — CBC AND DIFFERENTIAL
HCT: 34 — AB (ref 41–53)
Hemoglobin: 11.5 — AB (ref 13.5–17.5)
Neutrophils Absolute: 3.53
Platelets: 117 10*3/uL — AB (ref 150–400)
WBC: 5.6

## 2022-09-17 LAB — CBC: RBC: 3.65 — AB (ref 3.87–5.11)

## 2022-09-17 NOTE — Progress Notes (Signed)
[facility]  [address] [place],  [place]  [address] [phone]  Clinic Day:  09/17/2022  Referring physician: Paulina Fusi, MD  HISTORY OF PRESENT ILLNESS:  The patient is a 89 y.o. male who I follow for mild leukopenia and anemia.  He has essentially been borderline pancytopenic.  As his peripheral counts have held stable, they have been followed conservatively.  He comes in today for routine follow-up.  Since his last visit, the patient has been doing well.  He denies having any spontaneous infections as it pertains to his leukopenia.  He denies having any overt forms of blood loss as it pertains to his mild anemia.    PHYSICAL EXAM:  Blood pressure (!) 143/65, pulse 72, temperature (!) 97.5 F (36.4 C), resp. rate 16, height 6' (1.829 m), weight 173 lb 3.2 oz (78.6 kg), SpO2 97 %. Wt Readings from Last 3 Encounters:  09/17/22 173 lb 3.2 oz (78.6 kg)  03/22/22 179 lb (81.2 kg)  03/18/22 180 lb 8 oz (81.9 kg)   Body mass index is 23.49 kg/m. Performance status (ECOG): 0 Physical Exam Constitutional:      Appearance: Normal appearance. He is not ill-appearing.  HENT:     Mouth/Throat:     Mouth: Mucous membranes are moist.     Pharynx: Oropharynx is clear. No oropharyngeal exudate or posterior oropharyngeal erythema.  Cardiovascular:     Rate and Rhythm: Normal rate and regular rhythm.     Heart sounds: No murmur heard.    No friction rub. No gallop.  Pulmonary:     Effort: Pulmonary effort is normal. No respiratory distress.     Breath sounds: Normal breath sounds. No wheezing, rhonchi or rales.  Abdominal:     General: Bowel sounds are normal. There is no distension.     Palpations: Abdomen is soft. There is no mass.     Tenderness: There is no abdominal tenderness.  Musculoskeletal:        General: No swelling.     Right lower leg: No edema.     Left lower leg: No edema.  Lymphadenopathy:     Cervical: No cervical  adenopathy.     Upper Body:     Right upper body: No supraclavicular or axillary adenopathy.     Left upper body: No supraclavicular or axillary adenopathy.     Lower Body: No right inguinal adenopathy. No left inguinal adenopathy.  Skin:    General: Skin is warm.     Coloration: Skin is not jaundiced.     Findings: No lesion or rash.  Neurological:     General: No focal deficit present.     Mental Status: He is alert and oriented to person, place, and time. Mental status is at baseline.  Psychiatric:        Mood and Affect: Mood normal.        Behavior: Behavior normal.        Thought Content: Thought content normal.    LABS:      Latest Ref Rng & Units 09/17/2022   12:00 AM 03/18/2022   10:06 AM 09/17/2021   12:00 AM  CBC  WBC  5.6     5.4  6.0      Hemoglobin 13.5 - 17.5 11.5     11.9  11.5      Hematocrit 41 - 53 34     37.1  34      Platelets 150 -  400 K/uL 117     140  121         This result is from an external source.      Latest Ref Rng & Units 09/17/2021   12:00 AM 09/12/2021   11:55 AM 05/31/2021    1:18 PM  CMP  Glucose 70 - 99 mg/dL  83  93   BUN 4 - 21 18     12  17    Creatinine 0.6 - 1.3 1.1     1.02  1.14   Sodium 137 - 147 137     136  138   Potassium 3.5 - 5.1 mEq/L 4.4     4.2  4.7   Chloride 99 - 108 103     99  101   CO2 13 - 22 25     23  24    Calcium 8.7 - 10.7 8.8     9.3  9.3   Total Protein 6.0 - 8.5 g/dL  6.4  6.6   Total Bilirubin 0.0 - 1.2 mg/dL  0.3  0.3   Alkaline Phos 25 - 125 51     61  85   AST 14 - 40 23     18  17    ALT 10 - 40 U/L 22     18  14       This result is from an external source.   ASSESSMENT & PLAN:  Assessment/Plan:  A 89 y.o. male with mild leukopenia and anemia/borderline pancytopenia.  When evaluating his labs today, his white cell remains normal.  His hemoglobin is mildly low, but stable.  His thrombocytopenia remains stable at a low normal level.  Overall, I am pleased with his peripheral counts, which will  continue to be followed conservatively.  Clinically, the patient is doing well.  I will see him back in 6 months for repeat clinical assessment.  The patient understands all the plans discussed today and is in agreement with them.    Shakala Marlatt Kirby Funk, MD

## 2022-09-18 ENCOUNTER — Telehealth: Payer: Self-pay | Admitting: Oncology

## 2022-09-18 NOTE — Telephone Encounter (Signed)
Patient has been scheduled. Aware of appt date and time   Scheduling Message Entered by Rennis Harding A on 09/17/2022 at  2:42 PM Priority: Routine <No visit type provided>  Department: [facility]  Provider:  Scheduling Notes:  Labs/appt on 03-19-23

## 2022-09-24 NOTE — Progress Notes (Unsigned)
Cardiology Office Note:    Date:  09/25/2022   ID:  Jimmy Cooter., DOB 06-29-1924, MRN [id number]  PCP:  Paulina Fusi, MD  Cardiologist:  Norman Herrlich, MD    Referring MD: Paulina Fusi, MD    ASSESSMENT:    1. Frequent PVCs   2. On amiodarone therapy   3. Acquired hypothyroidism   4. Primary hypertension   5. Mixed hyperlipidemia    PLAN:    In order of problems listed above:  Jimmy Grant is done well his PVCs quite symptomatic in the past suppressed with low-dose amiodarone will reduce to the equivalent on a grams per day and his labs are followed for toxicity through his PCP every 6 months including CMP and thyroids Stable TSH is normal on current thyroid supplement and amiodarone induced hypothyroidism Blood pressure at target continue his current regimen ARB LDL at target continue statin   Next appointment: 6 months   Medication Adjustments/Labs and Tests Ordered: Current medicines are reviewed at length with the patient today.  Concerns regarding medicines are outlined above.  Orders Placed This Encounter  Procedures   EKG 12-Lead   Meds ordered this encounter  Medications   amiodarone (PACERONE) 200 MG tablet    Sig: Take 1 tablet (200 mg total) by mouth every other day.    Dispense:  45 tablet    Refill:  3     History of Present Illness:    Jimmy Iqbal. is a 89 y.o. male with a hx of frequent PVCs suppressed with amiodarone hypertension hyperlipidemia and hypothyroidism related to amiodarone treatment last seen 03/22/2022.  Compliance with diet, lifestyle and medications: Yes  Jimmy Grant is pleased with the quality of his life he and his wife live in [place] they are my former neighbor and both of them take amiodarone He is not having cardiovascular symptoms of palpitations syncope shortness of breath chest pain or palpitation He has no PVCs on his EKG in the office Will reduce amiodarone to every other day equivalent  of 100 mg daily to avoid toxicity over time Recent studies show a cholesterol of 121 LDL of 51 hemoglobin 11.5 creatinine 1.3 potassium 4.6 and TSH at target with supplementation 5.27 Past Medical History:  Diagnosis Date   'Light-for-dates' infant with signs of fetal malnutrition 08/19/2016   Acute respiratory disease due to COVID-19 virus 04/07/2019   Anemia 10/17/2017   Branch retinal vein occlusion    Cardiac arrhythmia 07/23/2016   Choroidal nevus, right 11/13/2017   Dyslipidemia    Edema of both legs    Glaucoma    History of prostate cancer    Hypercholesterolemia 06/08/2018   Hypertension 07/23/2016   Iritis, recurrent, bilateral 12/09/2013   Late effects of CVA (cerebrovascular accident)    Leukopenia 10/17/2017   Lumbar disc narrowing    Macular edema 07/16/2012   Malignant neoplasm of prostate (HCC) 03/17/2020   Mixed hyperlipidemia 06/02/2020   OSA on CPAP    Osteoarthritis of right glenohumeral joint    PAC (premature atrial contraction) 06/02/2020   Primary hypertension 06/02/2020   Primary open angle glaucoma (POAG) of both eyes, moderate stage 07/16/2012   Prostate cancer (HCC)    PSVT (paroxysmal supraventricular tachycardia) 08/12/2005   PVC (premature ventricular contraction) 06/02/2020   Status post intraocular lens implant 07/16/2012   Thyroid nodule    Vitamin B12 deficiency     Past Surgical History:  Procedure Laterality Date   ADENOIDECTOMY  1931  CATARACT EXTRACTION Bilateral    Left on 05/04/96, Right on 05/08/1993   HEMORROIDECTOMY  11/21/2014   Done by Hardin Negus   PROSTATECTOMY  01/11/1999   Transurethral   TEAR DUCT PROBING Right 07/2018   Tear duct surgery / Dr. Dimas Millin   VIDEO ASSISTED THORACOSCOPY (VATS)/DECORTICATION  2012    Current Medications: Current Meds  Medication Sig   amiodarone (PACERONE) 200 MG tablet Take 1 tablet (200 mg total) by mouth every other day.   aspirin EC 81 MG tablet Take 81 mg by mouth daily.   Azelastine HCl 0.15 % SOLN  Place 2 sprays into both nostrils daily.   calcium-vitamin D (OSCAL WITH D) 500-200 MG-UNIT tablet Take 1 tablet by mouth daily.   DIOVAN 160 MG tablet Take 160 mg by mouth 2 (two) times daily.    dorzolamide-timolol (COSOPT) 22.3-6.8 MG/ML ophthalmic solution Place 1 drop into both eyes 2 (two) times daily.   Glucosamine-Chondroitin (GLUCOSAMINE CHONDR COMPLEX PO) Take 1 tablet by mouth daily.   levothyroxine (SYNTHROID) 125 MCG tablet Take 125 mcg by mouth daily.   Multiple Vitamins-Minerals (PRESERVISION AREDS 2+MULTI VIT) CAPS Take 1 capsule by mouth 2 (two) times daily.   simvastatin (ZOCOR) 20 MG tablet Take 20 mg by mouth every evening.   [DISCONTINUED] amiodarone (PACERONE) 200 MG tablet Take 1 tablet (200 mg total) by mouth daily.     Allergies:   Patient has no known allergies.   Social History   Socioeconomic History   Marital status: Married    Spouse name: Not on file   Number of children: Not on file   Years of education: Not on file   Highest education level: Not on file  Occupational History   Occupation: retired  Tobacco Use   Smoking status: Former    Packs/day: 1.00    Years: 40.00    Additional pack years: 0.00    Total pack years: 40.00    Types: Cigarettes    Quit date: 1971    Years since quitting: 53.5    Passive exposure: Past   Smokeless tobacco: Never  Vaping Use   Vaping Use: Never used  Substance and Sexual Activity   Alcohol use: Yes    Alcohol/week: 7.0 standard drinks of alcohol    Types: 7 Glasses of wine per week   Drug use: Never   Sexual activity: Not on file  Other Topics Concern   Not on file  Social History Narrative   Not on file   Social Determinants of Health   Financial Resource Strain: Not on file  Food Insecurity: Not on file  Transportation Needs: Not on file  Physical Activity: Not on file  Stress: Not on file  Social Connections: Not on file       EKGs/Labs/Other Studies Reviewed:    The following studies  were reviewed today:  Cardiac Studies & Procedures       ECHOCARDIOGRAM  ECHOCARDIOGRAM COMPLETE 07/03/2020  Narrative ECHOCARDIOGRAM REPORT    Patient Name:   Jimmy Waren. Date of Exam: 07/03/2020 Medical Rec #:  [id number]                  Height:       71.0 in Accession #:    [id number]                 Weight:       189.0 lb Date of Birth:  [birth year]  BSA:          2.058 m Patient Age:    89 years                   BP:           130/74 mmHg Patient Gender: M                          HR:           79 bpm. Exam Location:    Procedure: 2D Echo  Indications:    Shortness of breath [786.05.ICD-9-CM]  History:        Patient has no prior history of Echocardiogram examinations. Signs/Symptoms:Edema of both legs; Risk Factors:Hypertension and Dyslipidemia.  Sonographer:    Louie Boston Referring Phys: [id number] KARDIE TOBB  IMPRESSIONS   1. Frequent PVCs during the study. 2. Left ventricular ejection fraction, by estimation, is 55 to 60%. The left ventricle has normal function. The left ventricle has no regional wall motion abnormalities. There is mild concentric left ventricular hypertrophy. Left ventricular diastolic parameters are consistent with Grade I diastolic dysfunction (impaired relaxation). The average left ventricular global longitudinal strain is -8.3 %. The global longitudinal strain is abnormal. 3. Right ventricular systolic function is normal. The right ventricular size is normal. There is normal pulmonary artery systolic pressure. 4. The mitral valve is normal in structure. No evidence of mitral valve regurgitation. No evidence of mitral stenosis. 5. The aortic valve is normal in structure. Aortic valve regurgitation is not visualized. No aortic stenosis is present. 6. Abdominal aorta mildly dilalated (2.3 cm). 7. No pericardial effusion.  FINDINGS Left Ventricle: Left ventricular ejection fraction, by estimation, is 55 to 60%.  The left ventricle has normal function. The left ventricle has no regional wall motion abnormalities. The average left ventricular global longitudinal strain is -8.3 %. The global longitudinal strain is abnormal. The left ventricular internal cavity size was normal in size. There is mild concentric left ventricular hypertrophy. Left ventricular diastolic parameters are consistent with Grade I diastolic dysfunction (impaired relaxation).  Right Ventricle: The right ventricular size is normal. No increase in right ventricular wall thickness. Right ventricular systolic function is normal. There is normal pulmonary artery systolic pressure. The tricuspid regurgitant velocity is 2.55 m/s, and with an assumed right atrial pressure of 3 mmHg, the estimated right ventricular systolic pressure is 29.0 mmHg.  Left Atrium: Left atrial size was normal in size.  Right Atrium: Right atrial size was normal in size.  Pericardium: There is no evidence of pericardial effusion. Presence of pericardial fat pad.  Mitral Valve: The mitral valve is normal in structure. No evidence of mitral valve regurgitation. No evidence of mitral valve stenosis.  Tricuspid Valve: The tricuspid valve is normal in structure. Tricuspid valve regurgitation is not demonstrated. No evidence of tricuspid stenosis.  Aortic Valve: The aortic valve is normal in structure. Aortic valve regurgitation is not visualized. No aortic stenosis is present.  Pulmonic Valve: The pulmonic valve was normal in structure. Pulmonic valve regurgitation is not visualized. No evidence of pulmonic stenosis.  Aorta: Abdominal aorta mildly dilalated (2.3 cm). The aortic root is normal in size and structure.  Venous: The inferior vena cava is normal in size with greater than 50% respiratory variability, suggesting right atrial pressure of 3 mmHg.  IAS/Shunts: No atrial level shunt detected by color flow Doppler.   LEFT VENTRICLE PLAX 2D LVIDd:  4.60 cm  Diastology LVIDs:         2.60 cm  LV e' medial:    6.09 cm/s LV PW:         1.30 cm  LV E/e' medial:  7.9 LV IVS:        1.30 cm  LV e' lateral:   7.83 cm/s LVOT diam:     2.00 cm  LV E/e' lateral: 6.2 LV SV:         66 LV SV Index:   32       2D Longitudinal Strain LVOT Area:     3.14 cm 2D Strain GLS Avg:     -8.3 %   RIGHT VENTRICLE            IVC RV S prime:     8.16 cm/s  IVC diam: 2.10 cm TAPSE (M-mode): 1.8 cm  LEFT ATRIUM             Index       RIGHT ATRIUM           Index LA diam:        2.90 cm 1.41 cm/m  RA Area:     12.10 cm LA Vol (A2C):   29.5 ml 14.33 ml/m RA Volume:   30.60 ml  14.87 ml/m LA Vol (A4C):   37.1 ml 18.02 ml/m LA Biplane Vol: 33.8 ml 16.42 ml/m AORTIC VALVE LVOT Vmax:   97.50 cm/s LVOT Vmean:  63.300 cm/s LVOT VTI:    0.211 m  AORTA Ao Root diam: 2.90 cm Ao Asc diam:  3.20 cm Ao Desc diam: 2.30 cm  MITRAL VALVE               TRICUSPID VALVE MV Area (PHT): 2.47 cm    TR Peak grad:   26.0 mmHg MV Decel Time: 307 msec    TR Vmax:        255.00 cm/s MV E velocity: 48.40 cm/s MV A velocity: 81.40 cm/s  SHUNTS MV E/A ratio:  0.59        Systemic VTI:  0.21 m Systemic Diam: 2.00 cm  Kardie Tobb DO Electronically signed by Thomasene Ripple DO Signature Date/Time: 07/03/2020/7:43:44 PM    Final    MONITORS  LONG TERM MONITOR (3-14 DAYS) 06/19/2020  Narrative The patient wore the monitor for 13 days 19 hours starting May 22, 2020 Indication: Palpitations  The minimum heart rate was 32 bpm, maximum heart rate was 165 bpm, and average heart rate was 71 bpm. Predominant underlying rhythm was Sinus Rhythm. Second Degree AV Block-Mobitz I (Wenckebach) was present   Premature atrial complexes were rare less than 1%. Premature Ventricular complexes where frequent (9.4%, [id number]).  No pauses, No AV block and no atrial fibrillation present. No patient triggered events or diary events were noted  Conclusion: This study is  remarkable for the following: 1.  Frequent premature ventricular complex. 2.  Single episode of type I second-degree AV block (Wenckebach)-this is nonpathological.           EKG Interpretation  Date/Time:  Wednesday September 25 2022 11:04:01 EDT Ventricular Rate:  69 PR Interval:  202 QRS Duration: 122 QT Interval:  426 QTC Calculation: 456 R Axis:   -36 Text Interpretation: Sinus rhythm with first degree AV block Left axis deviation Non-specific intra-ventricular conduction delay Minimal voltage criteria for LVH, may be normal variant ( Cornell product ) When compared with ECG of 07-Apr-2019 13:18, PREVIOUS ECG IS PRESENT Confirmed  by Norman Herrlich ([id number]) on 09/25/2022 11:10:15 AM EKG Interpretation  Date/Time:  Wednesday September 25 2022 11:04:01 EDT Ventricular Rate:  69 PR Interval:  202 QRS Duration: 122 QT Interval:  426 QTC Calculation: 456 R Axis:   -36 Text Interpretation: Sinus rhythm with first degree AV block Left axis deviation Non-specific intra-ventricular conduction delay Minimal voltage criteria for LVH, may be normal variant ( Cornell product ) When compared with ECG of 07-Apr-2019 13:18, PREVIOUS ECG IS PRESENT Confirmed by Norman Herrlich ([id number]) on 09/25/2022 11:10:15 AM    Recent Labs: 09/17/2022: Hemoglobin 11.5; Platelets 117  Recent Lipid Panel    Component Value Date/Time   TRIG 124 04/07/2019 1039    Physical Exam:    VS:  BP 124/60 (BP Location: Right Arm, Patient Position: Sitting, Cuff Size: Normal)   Pulse 72   Ht 5\' 11"  (1.803 m)   Wt 173 lb 6.4 oz (78.7 kg)   SpO2 98%   BMI 24.18 kg/m     Wt Readings from Last 3 Encounters:  09/25/22 173 lb 6.4 oz (78.7 kg)  09/17/22 173 lb 3.2 oz (78.6 kg)  03/22/22 179 lb (81.2 kg)     GEN: He looks his age well nourished, well developed in no acute distress HEENT: Normal NECK: No JVD; No carotid bruits LYMPHATICS: No lymphadenopathy CARDIAC: RRR, no murmurs, rubs, gallops RESPIRATORY:  Clear to  auscultation without rales, wheezing or rhonchi  ABDOMEN: Soft, non-tender, non-distended MUSCULOSKELETAL:  No edema; No deformity  SKIN: Warm and dry NEUROLOGIC:  Alert and oriented x 3 PSYCHIATRIC:  Normal affect    Signed, Norman Herrlich, MD  09/25/2022 11:47 AM    [facility]

## 2022-09-25 ENCOUNTER — Encounter: Payer: Self-pay | Admitting: Cardiology

## 2022-09-25 ENCOUNTER — Ambulatory Visit: Payer: Medicare Other | Attending: Cardiology | Admitting: Cardiology

## 2022-09-25 VITALS — BP 124/60 | HR 72 | Ht 71.0 in | Wt 173.4 lb

## 2022-09-25 DIAGNOSIS — E039 Hypothyroidism, unspecified: Secondary | ICD-10-CM | POA: Diagnosis not present

## 2022-09-25 DIAGNOSIS — I493 Ventricular premature depolarization: Secondary | ICD-10-CM

## 2022-09-25 DIAGNOSIS — D519 Vitamin B12 deficiency anemia, unspecified: Secondary | ICD-10-CM | POA: Diagnosis not present

## 2022-09-25 DIAGNOSIS — E785 Hyperlipidemia, unspecified: Secondary | ICD-10-CM | POA: Diagnosis not present

## 2022-09-25 DIAGNOSIS — R600 Localized edema: Secondary | ICD-10-CM | POA: Diagnosis not present

## 2022-09-25 DIAGNOSIS — I6789 Other cerebrovascular disease: Secondary | ICD-10-CM | POA: Diagnosis not present

## 2022-09-25 DIAGNOSIS — I10 Essential (primary) hypertension: Secondary | ICD-10-CM | POA: Diagnosis not present

## 2022-09-25 DIAGNOSIS — E782 Mixed hyperlipidemia: Secondary | ICD-10-CM

## 2022-09-25 DIAGNOSIS — G8191 Hemiplegia, unspecified affecting right dominant side: Secondary | ICD-10-CM | POA: Diagnosis not present

## 2022-09-25 DIAGNOSIS — Z79899 Other long term (current) drug therapy: Secondary | ICD-10-CM

## 2022-09-25 DIAGNOSIS — D638 Anemia in other chronic diseases classified elsewhere: Secondary | ICD-10-CM | POA: Diagnosis not present

## 2022-09-25 DIAGNOSIS — I471 Supraventricular tachycardia, unspecified: Secondary | ICD-10-CM | POA: Diagnosis not present

## 2022-09-25 MED ORDER — AMIODARONE HCL 200 MG PO TABS: 200.0000 mg | ORAL_TABLET | ORAL | 3 refills | Status: DC

## 2022-09-25 NOTE — Patient Instructions (Signed)
Medication Instructions:  Your physician has recommended you make the following change in your medication:   START: Amiodarone 200 mg every other day  *If you need a refill on your cardiac medications before your next appointment, please call your pharmacy*   Lab Work: None If you have labs (blood work) drawn today and your tests are completely normal, you will receive your results only by: MyChart Message (if you have MyChart) OR A paper copy in the mail If you have any lab test that is abnormal or we need to change your treatment, we will call you to review the results.   Testing/Procedures: None   Follow-Up: At [facility], you and your health needs are our priority.  As part of our continuing mission to provide you with exceptional heart care, we have created designated Provider Care Teams.  These Care Teams include your primary Cardiologist (physician) and Advanced Practice Providers (APPs -  Physician Assistants and Nurse Practitioners) who all work together to provide you with the care you need, when you need it.  We recommend signing up for the patient portal called "MyChart".  Sign up information is provided on this After Visit Summary.  MyChart is used to connect with patients for Virtual Visits (Telemedicine).  Patients are able to view lab/test results, encounter notes, upcoming appointments, etc.  Non-urgent messages can be sent to your provider as well.   To learn more about what you can do with MyChart, go to [email or web address].    Your next appointment:   6 month(s)  Provider:   Norman Herrlich, MD    Other Instructions None

## 2022-09-26 LAB — LAB REPORT - SCANNED: EGFR: 67

## 2022-10-30 DIAGNOSIS — D519 Vitamin B12 deficiency anemia, unspecified: Secondary | ICD-10-CM | POA: Diagnosis not present

## 2022-12-04 DIAGNOSIS — D519 Vitamin B12 deficiency anemia, unspecified: Secondary | ICD-10-CM | POA: Diagnosis not present

## 2022-12-05 DIAGNOSIS — G4733 Obstructive sleep apnea (adult) (pediatric): Secondary | ICD-10-CM | POA: Diagnosis not present

## 2022-12-11 ENCOUNTER — Telehealth: Payer: Self-pay | Admitting: Cardiology

## 2022-12-11 ENCOUNTER — Other Ambulatory Visit: Payer: Self-pay

## 2022-12-11 NOTE — Telephone Encounter (Signed)
Called patient and he reported that he was taking Amiodarone 200 mg every day and in June Dr. Dulce Sellar reduced his dose to every other day. Before last Thursday patient was not having palpitations. Last Thursday patient started having palpitations and he started taking 2 pills of Amiodarone per day for 3 days and the palpitations stopped. Currently he is taking a pill of Amiodarone when he starts having palpitations which is usually twice per day. Patient is asking which dose of Amiodarone should he be taking to help control his palpitations? Please advise.

## 2022-12-11 NOTE — Telephone Encounter (Signed)
Patient returned call

## 2022-12-11 NOTE — Telephone Encounter (Signed)
Pt states he would like to speak with Dr. Dulce Sellar. Please advise

## 2022-12-11 NOTE — Telephone Encounter (Signed)
Left message for the patient to call back.

## 2022-12-12 ENCOUNTER — Other Ambulatory Visit: Payer: Self-pay

## 2022-12-12 MED ORDER — AMIODARONE HCL 200 MG PO TABS: 200.0000 mg | ORAL_TABLET | Freq: Every day | ORAL | 3 refills | Status: DC

## 2022-12-12 NOTE — Telephone Encounter (Signed)
Left message for the patient to call back.

## 2022-12-12 NOTE — Telephone Encounter (Signed)
Called patient and informed him of Dr. Madireddy's recommendation below:  "Please advise him not to take amiodarone on an as-needed basis. Please switch amiodarone dose to 200 mg once a day. If he continues to remain symptomatic, should have a Holter monitor for PVC burden, should come in for a follow-up visit and consider being started on beta-blockers."  New amiodarone dose ordered via Epic and sent to the patient's pharmacy. Patient verbalized understanding and had no further questions.

## 2023-01-08 ENCOUNTER — Ambulatory Visit: Payer: Medicare Other | Admitting: Pulmonary Disease

## 2023-01-08 ENCOUNTER — Encounter: Payer: Self-pay | Admitting: Pulmonary Disease

## 2023-01-08 VITALS — BP 132/64 | HR 68 | Temp 97.8°F | Ht 71.5 in | Wt 169.2 lb

## 2023-01-08 DIAGNOSIS — I10 Essential (primary) hypertension: Secondary | ICD-10-CM | POA: Diagnosis not present

## 2023-01-08 DIAGNOSIS — G4733 Obstructive sleep apnea (adult) (pediatric): Secondary | ICD-10-CM | POA: Diagnosis not present

## 2023-01-08 DIAGNOSIS — G8191 Hemiplegia, unspecified affecting right dominant side: Secondary | ICD-10-CM | POA: Diagnosis not present

## 2023-01-08 DIAGNOSIS — E785 Hyperlipidemia, unspecified: Secondary | ICD-10-CM | POA: Diagnosis not present

## 2023-01-08 DIAGNOSIS — Z23 Encounter for immunization: Secondary | ICD-10-CM | POA: Diagnosis not present

## 2023-01-08 DIAGNOSIS — D638 Anemia in other chronic diseases classified elsewhere: Secondary | ICD-10-CM | POA: Diagnosis not present

## 2023-01-08 DIAGNOSIS — D519 Vitamin B12 deficiency anemia, unspecified: Secondary | ICD-10-CM | POA: Diagnosis not present

## 2023-01-08 DIAGNOSIS — I471 Supraventricular tachycardia, unspecified: Secondary | ICD-10-CM | POA: Diagnosis not present

## 2023-01-08 DIAGNOSIS — R600 Localized edema: Secondary | ICD-10-CM | POA: Diagnosis not present

## 2023-01-08 DIAGNOSIS — E039 Hypothyroidism, unspecified: Secondary | ICD-10-CM | POA: Diagnosis not present

## 2023-01-08 DIAGNOSIS — I6789 Other cerebrovascular disease: Secondary | ICD-10-CM | POA: Diagnosis not present

## 2023-01-08 NOTE — Progress Notes (Signed)
[address] [name]    [id number]    May 20, 1924  Primary Care Physician:Schultz, Jonetta Speak, MD  Referring Physician: Paulina Fusi, MD [address] [place],  [place] [address]  Chief complaint:   History of obstructive sleep apnea Continues to use CPAP on a nightly basis  HPI:  Has used CPAP since 2017 Currently on a CPAP of 11  Continues to do well  Has no significant complaints today  He does have documentation of his usual CPAP use and is AHI's -Occasionally has AHI is in the sixes, sevens, mostly less than 1 -Did have a day when he went up to 20 something  Has been having some issues with PVCs, being addressed by his cardiologist, continues on amiodarone  He continues to tolerate CPAP well Waking up in the morning feeling like he is at a good nights rest  States he never sleeps on his back for any length of time known to him  Recently upgraded his machine  Clinically does not feel any different Continues to feel well generally Wakes up feeling like is at a good nights rest  He has mild obstructive sleep apnea titrated to a pressure of 9 in the lab Currently on a pressure of 11  Usually goes to bed between 9 and 10 PM Final wake up time about 6 AM  He feels his sleep is restorative He does not wake up to use the bathroom He does have musculoskeletal pains and discomfort but does not feel this is contributing to his awakenings  Continues to keep a good log about his symptoms and CPAP use  Currently uses a full facemask  Outpatient Encounter Medications as of 01/08/2023  Medication Sig   amiodarone (PACERONE) 200 MG tablet Take 1 tablet (200 mg total) by mouth daily.   aspirin EC 81 MG tablet Take 81 mg by mouth daily.   Azelastine HCl 0.15 % SOLN Place 2 sprays into both nostrils daily.   calcium-vitamin D (OSCAL WITH D) 500-200 MG-UNIT tablet Take 1 tablet by mouth daily.   DIOVAN 160 MG tablet Take 160 mg by mouth 2  (two) times daily.    dorzolamide-timolol (COSOPT) 22.3-6.8 MG/ML ophthalmic solution Place 1 drop into both eyes 2 (two) times daily.   Glucosamine-Chondroitin (GLUCOSAMINE CHONDR COMPLEX PO) Take 1 tablet by mouth daily.   latanoprost (XALATAN) 0.005 % ophthalmic solution Place 1 drop into both eyes at bedtime.   levothyroxine (SYNTHROID) 125 MCG tablet Take 125 mcg by mouth daily.   Multiple Vitamins-Minerals (PRESERVISION AREDS 2+MULTI VIT) CAPS Take 1 capsule by mouth 2 (two) times daily.   simvastatin (ZOCOR) 20 MG tablet Take 20 mg by mouth every evening.   No facility-administered encounter medications on file as of 01/08/2023.    Allergies as of 01/08/2023   (No Known Allergies)    Past Medical History:  Diagnosis Date   'Light-for-dates' infant with signs of fetal malnutrition 08/19/2016   Acute respiratory disease due to COVID-19 virus 04/07/2019   Anemia 10/17/2017   Branch retinal vein occlusion    Cardiac arrhythmia 07/23/2016   Choroidal nevus, right 11/13/2017   Dyslipidemia    Edema of both legs    Glaucoma    History of prostate cancer    Hypercholesterolemia 06/08/2018   Hypertension 07/23/2016   Iritis, recurrent, bilateral 12/09/2013   Late effects of CVA (cerebrovascular accident)    Leukopenia 10/17/2017   Lumbar disc narrowing  Macular edema 07/16/2012   Malignant neoplasm of prostate (HCC) 03/17/2020   Mixed hyperlipidemia 06/02/2020   OSA on CPAP    Osteoarthritis of right glenohumeral joint    PAC (premature atrial contraction) 06/02/2020   Primary hypertension 06/02/2020   Primary open angle glaucoma (POAG) of both eyes, moderate stage 07/16/2012   Prostate cancer (HCC)    PSVT (paroxysmal supraventricular tachycardia) (HCC) 08/12/2005   PVC (premature ventricular contraction) 06/02/2020   Status post intraocular lens implant 07/16/2012   Thyroid nodule    Vitamin B12 deficiency     Past Surgical History:  Procedure Laterality Date   ADENOIDECTOMY  1931    CATARACT EXTRACTION Bilateral    Left on 05/04/96, Right on 05/08/1993   HEMORROIDECTOMY  11/21/2014   Done by Hardin Negus   PROSTATECTOMY  01/11/1999   Transurethral   TEAR DUCT PROBING Right 07/2018   Tear duct surgery / Dr. Dimas Millin   VIDEO ASSISTED THORACOSCOPY (VATS)/DECORTICATION  2012    Family History  Problem Relation Age of Onset   Diabetes Mother    Hypertension Mother    Kidney disease Father     Social History   Socioeconomic History   Marital status: Married    Spouse name: Not on file   Number of children: Not on file   Years of education: Not on file   Highest education level: Not on file  Occupational History   Occupation: retired  Tobacco Use   Smoking status: Former    Current packs/day: 0.00    Average packs/day: 1 pack/day for 40.0 years (40.0 ttl pk-yrs)    Types: Cigarettes    Start date: 32    Quit date: 1971    Years since quitting: 53.8    Passive exposure: Past   Smokeless tobacco: Never  Vaping Use   Vaping status: Never Used  Substance and Sexual Activity   Alcohol use: Yes    Alcohol/week: 7.0 standard drinks of alcohol    Types: 7 Glasses of wine per week   Drug use: Never   Sexual activity: Not on file  Other Topics Concern   Not on file  Social History Narrative   Not on file   Social Determinants of Health   Financial Resource Strain: Not on file  Food Insecurity: Not on file  Transportation Needs: Not on file  Physical Activity: Not on file  Stress: Not on file  Social Connections: Not on file  Intimate Partner Violence: Not on file    Review of Systems  Constitutional:  Negative for fatigue.  Respiratory:  Positive for apnea.   Psychiatric/Behavioral:  Positive for sleep disturbance.     Vitals:   01/08/23 1457  BP: 132/64  Pulse: 68  Temp: 97.8 F (36.6 C)  SpO2: 97%    Physical Exam Constitutional:      Appearance: Normal appearance. He is well-developed.  HENT:     Head: Normocephalic.     Nose:  Nose normal.     Mouth/Throat:     Mouth: Mucous membranes are moist.  Neck:     Thyroid: No thyromegaly.     Trachea: No tracheal deviation.  Cardiovascular:     Rate and Rhythm: Normal rate and regular rhythm.  Pulmonary:     Effort: Pulmonary effort is normal. No respiratory distress.     Breath sounds: Normal breath sounds. No stridor. No wheezing, rhonchi or rales.  Musculoskeletal:     Cervical back: No rigidity or tenderness.  Neurological:  General: No focal deficit present.     Mental Status: He is alert.  Psychiatric:        Mood and Affect: Mood normal.    Data Reviewed: Sleep study from 2017 did reveal mild obstructive sleep apnea Titration study showed he was titrated to a pressure of 9 with residual AHI 1.4 Patient's AHI on this data that he keeps regularly shows AHI fluctuations from 4-13  Most recent compliance data shows AHI 1.8 Average sleep of 7 hours 50 minutes Currently on a pressure of 11  Assessment:  Obstructive sleep apnea -Continue on pressure of 11  Continues to tolerate CPAP well  Recent issues with PVCs -Being addressed by his cardiologist  Discussion about options of treatment about sleep apnea  Plan/Recommendations: Continue current CPAP use, CPAP set at 11  Follow-up a year from now  Encouraged to call with significant concerns    Virl Diamond MD [facility] 01/08/2023, 2:59 PM  CC: Paulina Fusi, MD

## 2023-01-08 NOTE — Patient Instructions (Signed)
I will see you a year from now  Continue using your CPAP nightly  Your documentation from the machine shows it is working well  Call us with significant concerns

## 2023-01-22 NOTE — Progress Notes (Unsigned)
Cardiology Office Note:    Date:  01/23/2023   ID:  [name], DOB 10-24-1924, MRN [id number]  PCP:  Paulina Fusi, MD  Cardiologist:  Norman Herrlich, MD    Referring MD: Paulina Fusi, MD    ASSESSMENT:    1. Frequent PVCs   2. On amiodarone therapy   3. Acquired hypothyroidism   4. Primary hypertension   5. Mixed hyperlipidemia    PLAN:    In order of problems listed above:  Not doing as well we will regard to do is do a 1 week monitor recheck echocardiogram reassess and may need to withdraw amiodarone and use an alternate antihypertensive agent His thyroid is normal recent lab work with his primary care physician TSH 1.50 01/08/2023 hemoglobin 11.7 creatinine 1.09 potassium 4.2   Next appointment: 3 months   Medication Adjustments/Labs and Tests Ordered: Current medicines are reviewed at length with the patient today.  Concerns regarding medicines are outlined above.  Orders Placed This Encounter  Procedures   EKG 12-Lead   No orders of the defined types were placed in this encounter.    History of Present Illness:    Jimmy Venuti. is a 89 y.o. male with a hx of frequent and symptomatic PVCs suppressed with low-dose amiodarone hypertension hyperlipidemia and hypothyroidism related to amiodarone treatment last seen 09/25/2022.  Compliance with diet, lifestyle and medications: Yes  He will that we increase his amiodarone dosage he is not doing well he is having frequent bothersome palpitation at nighttime resolves during the day and ask what his options are  We will apply 1 weeks ZIO monitor to reassess his rhythm we may need to withdraw amiodarone and choose another agent his previous echocardiogram showed normal function I will recheck and if it is remains preserved we could consider flecainide   Past Medical History:  Diagnosis Date   'Light-for-dates' infant with signs of fetal malnutrition 08/19/2016   Acute respiratory  disease due to COVID-19 virus 04/07/2019   Anemia 10/17/2017   Branch retinal vein occlusion    Cardiac arrhythmia 07/23/2016   Choroidal nevus, right 11/13/2017   Dyslipidemia    Edema of both legs    Frequent PVCs 09/25/2020   Glaucoma    History of prostate cancer    Hypercholesterolemia 06/08/2018   Hypertension 07/23/2016   Intermediate stage nonexudative age-related macular degeneration of both eyes 02/01/2021   Iritis, recurrent, bilateral 12/09/2013   Late effects of CVA (cerebrovascular accident)    Leukopenia 10/17/2017   Lumbar disc narrowing    Macular edema 07/16/2012   Malignant neoplasm of prostate (HCC) 03/17/2020   Mixed hyperlipidemia 06/02/2020   OSA on CPAP    Osteoarthritis of right glenohumeral joint    PAC (premature atrial contraction) 06/02/2020   Primary hypertension 06/02/2020   Primary open angle glaucoma (POAG) of both eyes, moderate stage 07/16/2012   Prostate cancer (HCC)    PSVT (paroxysmal supraventricular tachycardia) (HCC) 08/12/2005   PVC (premature ventricular contraction) 06/02/2020   Status post intraocular lens implant 07/16/2012   Thyroid nodule    Vitamin B12 deficiency     Current Medications: Current Meds  Medication Sig   amiodarone (PACERONE) 200 MG tablet Take 1 tablet (200 mg total) by mouth daily.   aspirin EC 81 MG tablet Take 81 mg by mouth daily.   Azelastine HCl 0.15 % SOLN Place 2 sprays into both nostrils daily.   calcium-vitamin D (OSCAL WITH D) 500-200 MG-UNIT tablet Take  Cardiology Office Note:    Date:  01/23/2023   ID:  [name], DOB 10-24-1924, MRN [id number]  PCP:  Paulina Fusi, MD  Cardiologist:  Norman Herrlich, MD    Referring MD: Paulina Fusi, MD    ASSESSMENT:    1. Frequent PVCs   2. On amiodarone therapy   3. Acquired hypothyroidism   4. Primary hypertension   5. Mixed hyperlipidemia    PLAN:    In order of problems listed above:  Not doing as well we will regard to do is do a 1 week monitor recheck echocardiogram reassess and may need to withdraw amiodarone and use an alternate antihypertensive agent His thyroid is normal recent lab work with his primary care physician TSH 1.50 01/08/2023 hemoglobin 11.7 creatinine 1.09 potassium 4.2   Next appointment: 3 months   Medication Adjustments/Labs and Tests Ordered: Current medicines are reviewed at length with the patient today.  Concerns regarding medicines are outlined above.  Orders Placed This Encounter  Procedures   EKG 12-Lead   No orders of the defined types were placed in this encounter.    History of Present Illness:    Jimmy Venuti. is a 89 y.o. male with a hx of frequent and symptomatic PVCs suppressed with low-dose amiodarone hypertension hyperlipidemia and hypothyroidism related to amiodarone treatment last seen 09/25/2022.  Compliance with diet, lifestyle and medications: Yes  He will that we increase his amiodarone dosage he is not doing well he is having frequent bothersome palpitation at nighttime resolves during the day and ask what his options are  We will apply 1 weeks ZIO monitor to reassess his rhythm we may need to withdraw amiodarone and choose another agent his previous echocardiogram showed normal function I will recheck and if it is remains preserved we could consider flecainide   Past Medical History:  Diagnosis Date   'Light-for-dates' infant with signs of fetal malnutrition 08/19/2016   Acute respiratory  disease due to COVID-19 virus 04/07/2019   Anemia 10/17/2017   Branch retinal vein occlusion    Cardiac arrhythmia 07/23/2016   Choroidal nevus, right 11/13/2017   Dyslipidemia    Edema of both legs    Frequent PVCs 09/25/2020   Glaucoma    History of prostate cancer    Hypercholesterolemia 06/08/2018   Hypertension 07/23/2016   Intermediate stage nonexudative age-related macular degeneration of both eyes 02/01/2021   Iritis, recurrent, bilateral 12/09/2013   Late effects of CVA (cerebrovascular accident)    Leukopenia 10/17/2017   Lumbar disc narrowing    Macular edema 07/16/2012   Malignant neoplasm of prostate (HCC) 03/17/2020   Mixed hyperlipidemia 06/02/2020   OSA on CPAP    Osteoarthritis of right glenohumeral joint    PAC (premature atrial contraction) 06/02/2020   Primary hypertension 06/02/2020   Primary open angle glaucoma (POAG) of both eyes, moderate stage 07/16/2012   Prostate cancer (HCC)    PSVT (paroxysmal supraventricular tachycardia) (HCC) 08/12/2005   PVC (premature ventricular contraction) 06/02/2020   Status post intraocular lens implant 07/16/2012   Thyroid nodule    Vitamin B12 deficiency     Current Medications: Current Meds  Medication Sig   amiodarone (PACERONE) 200 MG tablet Take 1 tablet (200 mg total) by mouth daily.   aspirin EC 81 MG tablet Take 81 mg by mouth daily.   Azelastine HCl 0.15 % SOLN Place 2 sprays into both nostrils daily.   calcium-vitamin D (OSCAL WITH D) 500-200 MG-UNIT tablet Take  Cardiology Office Note:    Date:  01/23/2023   ID:  [name], DOB 10-24-1924, MRN [id number]  PCP:  Paulina Fusi, MD  Cardiologist:  Norman Herrlich, MD    Referring MD: Paulina Fusi, MD    ASSESSMENT:    1. Frequent PVCs   2. On amiodarone therapy   3. Acquired hypothyroidism   4. Primary hypertension   5. Mixed hyperlipidemia    PLAN:    In order of problems listed above:  Not doing as well we will regard to do is do a 1 week monitor recheck echocardiogram reassess and may need to withdraw amiodarone and use an alternate antihypertensive agent His thyroid is normal recent lab work with his primary care physician TSH 1.50 01/08/2023 hemoglobin 11.7 creatinine 1.09 potassium 4.2   Next appointment: 3 months   Medication Adjustments/Labs and Tests Ordered: Current medicines are reviewed at length with the patient today.  Concerns regarding medicines are outlined above.  Orders Placed This Encounter  Procedures   EKG 12-Lead   No orders of the defined types were placed in this encounter.    History of Present Illness:    Jimmy Venuti. is a 89 y.o. male with a hx of frequent and symptomatic PVCs suppressed with low-dose amiodarone hypertension hyperlipidemia and hypothyroidism related to amiodarone treatment last seen 09/25/2022.  Compliance with diet, lifestyle and medications: Yes  He will that we increase his amiodarone dosage he is not doing well he is having frequent bothersome palpitation at nighttime resolves during the day and ask what his options are  We will apply 1 weeks ZIO monitor to reassess his rhythm we may need to withdraw amiodarone and choose another agent his previous echocardiogram showed normal function I will recheck and if it is remains preserved we could consider flecainide   Past Medical History:  Diagnosis Date   'Light-for-dates' infant with signs of fetal malnutrition 08/19/2016   Acute respiratory  disease due to COVID-19 virus 04/07/2019   Anemia 10/17/2017   Branch retinal vein occlusion    Cardiac arrhythmia 07/23/2016   Choroidal nevus, right 11/13/2017   Dyslipidemia    Edema of both legs    Frequent PVCs 09/25/2020   Glaucoma    History of prostate cancer    Hypercholesterolemia 06/08/2018   Hypertension 07/23/2016   Intermediate stage nonexudative age-related macular degeneration of both eyes 02/01/2021   Iritis, recurrent, bilateral 12/09/2013   Late effects of CVA (cerebrovascular accident)    Leukopenia 10/17/2017   Lumbar disc narrowing    Macular edema 07/16/2012   Malignant neoplasm of prostate (HCC) 03/17/2020   Mixed hyperlipidemia 06/02/2020   OSA on CPAP    Osteoarthritis of right glenohumeral joint    PAC (premature atrial contraction) 06/02/2020   Primary hypertension 06/02/2020   Primary open angle glaucoma (POAG) of both eyes, moderate stage 07/16/2012   Prostate cancer (HCC)    PSVT (paroxysmal supraventricular tachycardia) (HCC) 08/12/2005   PVC (premature ventricular contraction) 06/02/2020   Status post intraocular lens implant 07/16/2012   Thyroid nodule    Vitamin B12 deficiency     Current Medications: Current Meds  Medication Sig   amiodarone (PACERONE) 200 MG tablet Take 1 tablet (200 mg total) by mouth daily.   aspirin EC 81 MG tablet Take 81 mg by mouth daily.   Azelastine HCl 0.15 % SOLN Place 2 sprays into both nostrils daily.   calcium-vitamin D (OSCAL WITH D) 500-200 MG-UNIT tablet Take  1 tablet by mouth daily.   DIOVAN 160 MG tablet Take 160 mg by mouth 2 (two) times daily.    dorzolamide-timolol (COSOPT) 22.3-6.8 MG/ML ophthalmic solution Place 1 drop into both eyes 2 (two) times daily.   Glucosamine-Chondroitin (GLUCOSAMINE CHONDR COMPLEX PO) Take 1 tablet by mouth daily.   latanoprost (XALATAN) 0.005 % ophthalmic solution Place 1 drop into both eyes at bedtime.   levothyroxine (SYNTHROID) 125 MCG tablet Take 125 mcg by mouth  daily.   Multiple Vitamins-Minerals (PRESERVISION AREDS 2+MULTI VIT) CAPS Take 1 capsule by mouth 2 (two) times daily.   simvastatin (ZOCOR) 20 MG tablet Take 20 mg by mouth every evening.      EKGs/Labs/Other Studies Reviewed:    The following studies were reviewed today:  Cardiac Studies & Procedures       ECHOCARDIOGRAM  ECHOCARDIOGRAM COMPLETE 07/03/2020  Narrative ECHOCARDIOGRAM REPORT    Patient Name:   Jimmy Couzens. Date of Exam: 07/03/2020 Medical Rec #:  [id number]                  Height:       71.0 in Accession #:    [id number]                 Weight:       189.0 lb Date of Birth:  10-25-1924                  BSA:          2.058 m Patient Age:    89 years                   BP:           130/74 mmHg Patient Gender: M                          HR:           79 bpm. Exam Location:  [facility]  Procedure: 2D Echo  Indications:    Shortness of breath [786.05.ICD-9-CM]  History:        Patient has no prior history of Echocardiogram examinations. Signs/Symptoms:Edema of both legs; Risk Factors:Hypertension and Dyslipidemia.  Sonographer:    Louie Boston Referring Phys: [id number] KARDIE TOBB  IMPRESSIONS   1. Frequent PVCs during the study. 2. Left ventricular ejection fraction, by estimation, is 55 to 60%. The left ventricle has normal function. The left ventricle has no regional wall motion abnormalities. There is mild concentric left ventricular hypertrophy. Left ventricular diastolic parameters are consistent with Grade I diastolic dysfunction (impaired relaxation). The average left ventricular global longitudinal strain is -8.3 %. The global longitudinal strain is abnormal. 3. Right ventricular systolic function is normal. The right ventricular size is normal. There is normal pulmonary artery systolic pressure. 4. The mitral valve is normal in structure. No evidence of mitral valve regurgitation. No evidence of mitral stenosis. 5. The aortic valve is normal  in structure. Aortic valve regurgitation is not visualized. No aortic stenosis is present. 6. Abdominal aorta mildly dilalated (2.3 cm). 7. No pericardial effusion.  FINDINGS Left Ventricle: Left ventricular ejection fraction, by estimation, is 55 to 60%. The left ventricle has normal function. The left ventricle has no regional wall motion abnormalities. The average left ventricular global longitudinal strain is -8.3 %. The global longitudinal strain is abnormal. The left ventricular internal cavity size was normal in size. There is mild concentric left ventricular hypertrophy. Left

## 2023-01-23 ENCOUNTER — Encounter: Payer: Self-pay | Admitting: Cardiology

## 2023-01-23 ENCOUNTER — Ambulatory Visit: Payer: Medicare Other | Attending: Cardiology | Admitting: Cardiology

## 2023-01-23 ENCOUNTER — Ambulatory Visit: Payer: Medicare Other | Attending: Cardiology

## 2023-01-23 VITALS — BP 114/54 | HR 71 | Ht 71.6 in | Wt 169.4 lb

## 2023-01-23 DIAGNOSIS — I493 Ventricular premature depolarization: Secondary | ICD-10-CM

## 2023-01-23 DIAGNOSIS — E039 Hypothyroidism, unspecified: Secondary | ICD-10-CM | POA: Diagnosis not present

## 2023-01-23 DIAGNOSIS — E782 Mixed hyperlipidemia: Secondary | ICD-10-CM

## 2023-01-23 DIAGNOSIS — I10 Essential (primary) hypertension: Secondary | ICD-10-CM | POA: Diagnosis not present

## 2023-01-23 DIAGNOSIS — Z79899 Other long term (current) drug therapy: Secondary | ICD-10-CM

## 2023-01-23 NOTE — Patient Instructions (Signed)
Medication Instructions:  Your physician recommends that you continue on your current medications as directed. Please refer to the Current Medication list given to you today.  *If you need a refill on your cardiac medications before your next appointment, please call your pharmacy*   Lab Work: None If you have labs (blood work) drawn today and your tests are completely normal, you will receive your results only by: MyChart Message (if you have MyChart) OR A paper copy in the mail If you have any lab test that is abnormal or we need to change your treatment, we will call you to review the results.   Testing/Procedures: A zio monitor was ordered today. It will remain on for 7 days. You will then return monitor and event diary in provided box. It takes 1-2 weeks for report to be downloaded and returned to [place]. We will call you with the results. If monitor falls off or has orange flashing light, please call Zio for further instructions.   Your physician has requested that you have an echocardiogram. Echocardiography is a painless test that uses sound waves to create images of your heart. It provides your doctor with information about the size and shape of your heart and how well your heart's chambers and valves are working. This procedure takes approximately one hour. There are no restrictions for this procedure. Please do NOT wear cologne, perfume, aftershave, or lotions (deodorant is allowed). Please arrive 15 minutes prior to your appointment time.     Follow-Up: At [facility], you and your health needs are our priority.  As part of our continuing mission to provide you with exceptional heart care, we have created designated Provider Care Teams.  These Care Teams include your primary Cardiologist (physician) and Advanced Practice Providers (APPs -  Physician Assistants and Nurse Practitioners) who all work together to provide you with the care you need, when you need it.  We  recommend signing up for the patient portal called "MyChart".  Sign up information is provided on this After Visit Summary.  MyChart is used to connect with patients for Virtual Visits (Telemedicine).  Patients are able to view lab/test results, encounter notes, upcoming appointments, etc.  Non-urgent messages can be sent to your provider as well.   To learn more about what you can do with MyChart, go to [email or web address].    Your next appointment:   3 month(s)  Provider:   Norman Herrlich, MD    Other Instructions None

## 2023-02-10 DIAGNOSIS — I10 Essential (primary) hypertension: Secondary | ICD-10-CM | POA: Diagnosis not present

## 2023-02-10 DIAGNOSIS — I493 Ventricular premature depolarization: Secondary | ICD-10-CM | POA: Diagnosis not present

## 2023-02-13 ENCOUNTER — Ambulatory Visit: Payer: Medicare Other | Attending: Cardiology

## 2023-02-13 DIAGNOSIS — E039 Hypothyroidism, unspecified: Secondary | ICD-10-CM

## 2023-02-13 DIAGNOSIS — D519 Vitamin B12 deficiency anemia, unspecified: Secondary | ICD-10-CM | POA: Diagnosis not present

## 2023-02-13 DIAGNOSIS — I493 Ventricular premature depolarization: Secondary | ICD-10-CM | POA: Diagnosis not present

## 2023-02-13 DIAGNOSIS — E782 Mixed hyperlipidemia: Secondary | ICD-10-CM

## 2023-02-13 DIAGNOSIS — I10 Essential (primary) hypertension: Secondary | ICD-10-CM | POA: Diagnosis not present

## 2023-02-13 DIAGNOSIS — Z79899 Other long term (current) drug therapy: Secondary | ICD-10-CM

## 2023-02-13 LAB — ECHOCARDIOGRAM COMPLETE
Calc EF: 42.3 %
S' Lateral: 3.9 cm
Single Plane A2C EF: 44.4 %
Single Plane A4C EF: 37.2 %

## 2023-02-19 DIAGNOSIS — R042 Hemoptysis: Secondary | ICD-10-CM | POA: Diagnosis not present

## 2023-02-19 DIAGNOSIS — J3489 Other specified disorders of nose and nasal sinuses: Secondary | ICD-10-CM | POA: Diagnosis not present

## 2023-02-20 DIAGNOSIS — D3131 Benign neoplasm of right choroid: Secondary | ICD-10-CM | POA: Diagnosis not present

## 2023-02-20 DIAGNOSIS — Z961 Presence of intraocular lens: Secondary | ICD-10-CM | POA: Diagnosis not present

## 2023-02-20 DIAGNOSIS — H34832 Tributary (branch) retinal vein occlusion, left eye, with macular edema: Secondary | ICD-10-CM | POA: Diagnosis not present

## 2023-02-20 DIAGNOSIS — H401132 Primary open-angle glaucoma, bilateral, moderate stage: Secondary | ICD-10-CM | POA: Diagnosis not present

## 2023-02-20 DIAGNOSIS — H353132 Nonexudative age-related macular degeneration, bilateral, intermediate dry stage: Secondary | ICD-10-CM | POA: Diagnosis not present

## 2023-02-21 ENCOUNTER — Other Ambulatory Visit: Payer: Self-pay

## 2023-02-21 DIAGNOSIS — J432 Centrilobular emphysema: Secondary | ICD-10-CM | POA: Diagnosis not present

## 2023-02-21 DIAGNOSIS — R042 Hemoptysis: Secondary | ICD-10-CM | POA: Diagnosis not present

## 2023-02-21 DIAGNOSIS — R918 Other nonspecific abnormal finding of lung field: Secondary | ICD-10-CM | POA: Diagnosis not present

## 2023-02-21 MED ORDER — MAGNESIUM 200 MG PO TABS: 200.0000 mg | ORAL_TABLET | Freq: Every day | ORAL | 3 refills | Status: AC

## 2023-03-04 DIAGNOSIS — H401133 Primary open-angle glaucoma, bilateral, severe stage: Secondary | ICD-10-CM | POA: Diagnosis not present

## 2023-03-18 NOTE — Progress Notes (Unsigned)
[facility]  [address] [place],  [place]  [address] [phone]  Clinic Day:  09/17/2022  Referring physician: Paulina Fusi, MD  HISTORY OF PRESENT ILLNESS:  The patient is a 89 y.o. male who I follow for mild leukopenia and anemia.  He has essentially been borderline pancytopenic.  As his peripheral counts have held stable, they have been followed conservatively.  He comes in today for routine follow-up.  Since his last visit, the patient has been doing well.  He denies having any spontaneous infections as it pertains to his leukopenia.  He denies having any overt forms of blood loss as it pertains to his mild anemia.    PHYSICAL EXAM:  There were no vitals taken for this visit. Wt Readings from Last 3 Encounters:  01/23/23 169 lb 6.4 oz (76.8 kg)  01/08/23 169 lb 3.2 oz (76.7 kg)  09/25/22 173 lb 6.4 oz (78.7 kg)   There is no height or weight on file to calculate BMI. Performance status (ECOG): 0 Physical Exam Constitutional:      Appearance: Normal appearance. He is not ill-appearing.  HENT:     Mouth/Throat:     Mouth: Mucous membranes are moist.     Pharynx: Oropharynx is clear. No oropharyngeal exudate or posterior oropharyngeal erythema.  Cardiovascular:     Rate and Rhythm: Normal rate and regular rhythm.     Heart sounds: No murmur heard.    No friction rub. No gallop.  Pulmonary:     Effort: Pulmonary effort is normal. No respiratory distress.     Breath sounds: Normal breath sounds. No wheezing, rhonchi or rales.  Abdominal:     General: Bowel sounds are normal. There is no distension.     Palpations: Abdomen is soft. There is no mass.     Tenderness: There is no abdominal tenderness.  Musculoskeletal:        General: No swelling.     Right lower leg: No edema.     Left lower leg: No edema.  Lymphadenopathy:     Cervical: No cervical adenopathy.     Upper Body:     Right upper body: No supraclavicular or axillary  adenopathy.     Left upper body: No supraclavicular or axillary adenopathy.     Lower Body: No right inguinal adenopathy. No left inguinal adenopathy.  Skin:    General: Skin is warm.     Coloration: Skin is not jaundiced.     Findings: No lesion or rash.  Neurological:     General: No focal deficit present.     Mental Status: He is alert and oriented to person, place, and time. Mental status is at baseline.  Psychiatric:        Mood and Affect: Mood normal.        Behavior: Behavior normal.        Thought Content: Thought content normal.    LABS:      Latest Ref Rng & Units 09/17/2022   12:00 AM 03/18/2022   10:06 AM 09/17/2021   12:00 AM  CBC  WBC  5.6     5.4  6.0      Hemoglobin 13.5 - 17.5 11.5     11.9  11.5      Hematocrit 41 - 53 34     37.1  34      Platelets 150 - 400 K/uL 117     140  121  This result is from an external source.      Latest Ref Rng & Units 09/17/2021   12:00 AM 09/12/2021   11:55 AM 05/31/2021    1:18 PM  CMP  Glucose 70 - 99 mg/dL  83  93   BUN 4 - 21 18     12  17    Creatinine 0.6 - 1.3 1.1     1.02  1.14   Sodium 137 - 147 137     136  138   Potassium 3.5 - 5.1 mEq/L 4.4     4.2  4.7   Chloride 99 - 108 103     99  101   CO2 13 - 22 25     23  24    Calcium 8.7 - 10.7 8.8     9.3  9.3   Total Protein 6.0 - 8.5 g/dL  6.4  6.6   Total Bilirubin 0.0 - 1.2 mg/dL  0.3  0.3   Alkaline Phos 25 - 125 51     61  85   AST 14 - 40 23     18  17    ALT 10 - 40 U/L 22     18  14       This result is from an external source.   ASSESSMENT & PLAN:  Assessment/Plan:  A 89 y.o. male with mild leukopenia and anemia/borderline pancytopenia.  When evaluating his labs today, his white cell remains normal.  His hemoglobin is mildly low, but stable.  His thrombocytopenia remains stable at a low normal level.  Overall, I am pleased with his peripheral counts, which will continue to be followed conservatively.  Clinically, the patient is doing well.  I will  see him back in 6 months for repeat clinical assessment.  The patient understands all the plans discussed today and is in agreement with them.    [place] Kirby Funk, MD

## 2023-03-19 ENCOUNTER — Inpatient Hospital Stay: Payer: Medicare Other | Attending: Oncology | Admitting: Oncology

## 2023-03-19 ENCOUNTER — Telehealth: Payer: Self-pay | Admitting: Oncology

## 2023-03-19 ENCOUNTER — Inpatient Hospital Stay: Payer: Medicare Other

## 2023-03-19 ENCOUNTER — Other Ambulatory Visit: Payer: Self-pay | Admitting: Oncology

## 2023-03-19 ENCOUNTER — Telehealth: Payer: Self-pay

## 2023-03-19 VITALS — BP 128/64 | HR 64 | Temp 98.1°F | Resp 18 | Ht 71.6 in | Wt 169.5 lb

## 2023-03-19 DIAGNOSIS — D539 Nutritional anemia, unspecified: Secondary | ICD-10-CM

## 2023-03-19 DIAGNOSIS — Z79899 Other long term (current) drug therapy: Secondary | ICD-10-CM | POA: Insufficient documentation

## 2023-03-19 DIAGNOSIS — D649 Anemia, unspecified: Secondary | ICD-10-CM | POA: Diagnosis not present

## 2023-03-19 DIAGNOSIS — D508 Other iron deficiency anemias: Secondary | ICD-10-CM

## 2023-03-19 DIAGNOSIS — D72829 Elevated white blood cell count, unspecified: Secondary | ICD-10-CM | POA: Diagnosis not present

## 2023-03-19 DIAGNOSIS — D61818 Other pancytopenia: Secondary | ICD-10-CM | POA: Insufficient documentation

## 2023-03-19 LAB — CBC WITH DIFFERENTIAL (CANCER CENTER ONLY)
Abs Immature Granulocytes: 0.07 10*3/uL (ref 0.00–0.07)
Basophils Absolute: 0 10*3/uL (ref 0.0–0.1)
Basophils Relative: 0 %
Eosinophils Absolute: 0 10*3/uL (ref 0.0–0.5)
Eosinophils Relative: 0 %
HCT: 32.7 % — ABNORMAL LOW (ref 39.0–52.0)
Hemoglobin: 10.9 g/dL — ABNORMAL LOW (ref 13.0–17.0)
Immature Granulocytes: 1 %
Lymphocytes Relative: 24 %
Lymphs Abs: 1.3 10*3/uL (ref 0.7–4.0)
MCH: 30.9 pg (ref 26.0–34.0)
MCHC: 33.3 g/dL (ref 30.0–36.0)
MCV: 92.6 fL (ref 80.0–100.0)
Monocytes Absolute: 1.3 10*3/uL — ABNORMAL HIGH (ref 0.1–1.0)
Monocytes Relative: 24 %
Neutro Abs: 2.6 10*3/uL (ref 1.7–7.7)
Neutrophils Relative %: 51 %
Platelet Count: 115 10*3/uL — ABNORMAL LOW (ref 150–400)
RBC: 3.53 MIL/uL — ABNORMAL LOW (ref 4.22–5.81)
RDW: 14 % (ref 11.5–15.5)
WBC Count: 5.3 10*3/uL (ref 4.0–10.5)
nRBC: 0 % (ref 0.0–0.2)
nRBC: 0 /100{WBCs}

## 2023-03-19 LAB — IRON AND TIBC
Iron: 59 ug/dL (ref 45–182)
Saturation Ratios: 16 % — ABNORMAL LOW (ref 17.9–39.5)
TIBC: 378 ug/dL (ref 250–450)
UIBC: 319 ug/dL

## 2023-03-19 LAB — FERRITIN: Ferritin: 14 ng/mL — ABNORMAL LOW (ref 24–336)

## 2023-03-19 NOTE — Telephone Encounter (Signed)
Latest Reference Range & Units 03/19/23 10:35  Iron 45 - 182 ug/dL 59  UIBC ug/dL 621  TIBC 308 - 657 ug/dL 846  Saturation Ratios 17.9 - 39.5 % 16 (L)  Ferritin 24 - 336 ng/mL 14 (L)  (L): Data is abnormally low

## 2023-03-19 NOTE — Telephone Encounter (Signed)
03/19/23 Next appt scheduled and confirmed with patient.

## 2023-03-20 ENCOUNTER — Encounter: Payer: Self-pay | Admitting: Oncology

## 2023-03-20 DIAGNOSIS — D509 Iron deficiency anemia, unspecified: Secondary | ICD-10-CM

## 2023-03-20 DIAGNOSIS — D519 Vitamin B12 deficiency anemia, unspecified: Secondary | ICD-10-CM | POA: Diagnosis not present

## 2023-03-20 HISTORY — DX: Iron deficiency anemia, unspecified: D50.9

## 2023-03-24 ENCOUNTER — Inpatient Hospital Stay: Payer: Medicare Other

## 2023-03-24 VITALS — BP 141/70 | HR 66 | Temp 98.0°F | Resp 18

## 2023-03-24 DIAGNOSIS — D508 Other iron deficiency anemias: Secondary | ICD-10-CM

## 2023-03-24 DIAGNOSIS — Z79899 Other long term (current) drug therapy: Secondary | ICD-10-CM | POA: Diagnosis not present

## 2023-03-24 DIAGNOSIS — D72829 Elevated white blood cell count, unspecified: Secondary | ICD-10-CM | POA: Diagnosis not present

## 2023-03-24 DIAGNOSIS — D649 Anemia, unspecified: Secondary | ICD-10-CM | POA: Diagnosis not present

## 2023-03-24 DIAGNOSIS — D61818 Other pancytopenia: Secondary | ICD-10-CM | POA: Diagnosis not present

## 2023-03-24 MED ORDER — SODIUM CHLORIDE 0.9 % IV SOLN
510.0000 mg | Freq: Once | INTRAVENOUS | Status: AC
  Administered 2023-03-24: 510 mg via INTRAVENOUS
  Filled 2023-03-24: qty 510

## 2023-03-24 NOTE — Patient Instructions (Signed)
 Ferumoxytol Injection What is this medication? FERUMOXYTOL (FER ue MOX i tol) treats low levels of iron in your body (iron deficiency anemia). Iron is a mineral that plays an important role in making red blood cells, which carry oxygen from your lungs to the rest of your body. This medicine may be used for other purposes; ask your health care provider or pharmacist if you have questions. COMMON BRAND NAME(S): Feraheme What should I tell my care team before I take this medication? They need to know if you have any of these conditions: Anemia not caused by low iron levels High levels of iron in the blood Magnetic resonance imaging (MRI) test scheduled An unusual or allergic reaction to iron, other medications, foods, dyes, or preservatives Pregnant or trying to get pregnant Breastfeeding How should I use this medication? This medication is injected into a vein. It is given by your care team in a hospital or clinic setting. Talk to your care team the use of this medication in children. Special care may be needed. Overdosage: If you think you have taken too much of this medicine contact a poison control center or emergency room at once. NOTE: This medicine is only for you. Do not share this medicine with others. What if I miss a dose? It is important not to miss your dose. Call your care team if you are unable to keep an appointment. What may interact with this medication? Other iron products This list may not describe all possible interactions. Give your health care provider a list of all the medicines, herbs, non-prescription drugs, or dietary supplements you use. Also tell them if you smoke, drink alcohol, or use illegal drugs. Some items may interact with your medicine. What should I watch for while using this medication? Visit your care team regularly. Tell your care team if your symptoms do not start to get better or if they get worse. You may need blood work done while you are taking this  medication. You may need to follow a special diet. Talk to your care team. Foods that contain iron include: whole grains/cereals, dried fruits, beans, or peas, leafy green vegetables, and organ meats (liver, kidney). What side effects may I notice from receiving this medication? Side effects that you should report to your care team as soon as possible: Allergic reactions--skin rash, itching, hives, swelling of the face, lips, tongue, or throat Low blood pressure--dizziness, feeling faint or lightheaded, blurry vision Shortness of breath Side effects that usually do not require medical attention (report to your care team if they continue or are bothersome): Flushing Headache Joint pain Muscle pain Nausea Pain, redness, or irritation at injection site This list may not describe all possible side effects. Call your doctor for medical advice about side effects. You may report side effects to FDA at 1-800-FDA-1088. Where should I keep my medication? This medication is given in a hospital or clinic. It will not be stored at home. NOTE: This sheet is a summary. It may not cover all possible information. If you have questions about this medicine, talk to your doctor, pharmacist, or health care provider.  2024 Elsevier/Gold Standard (2022-08-23 00:00:00)

## 2023-03-31 ENCOUNTER — Inpatient Hospital Stay: Payer: Medicare Other

## 2023-03-31 ENCOUNTER — Ambulatory Visit: Payer: Medicare Other | Admitting: Cardiology

## 2023-03-31 VITALS — BP 150/66 | HR 59 | Temp 98.0°F | Resp 18 | Ht 71.6 in | Wt 171.5 lb

## 2023-03-31 DIAGNOSIS — Z79899 Other long term (current) drug therapy: Secondary | ICD-10-CM | POA: Diagnosis not present

## 2023-03-31 DIAGNOSIS — D649 Anemia, unspecified: Secondary | ICD-10-CM | POA: Diagnosis not present

## 2023-03-31 DIAGNOSIS — D61818 Other pancytopenia: Secondary | ICD-10-CM | POA: Diagnosis not present

## 2023-03-31 DIAGNOSIS — D508 Other iron deficiency anemias: Secondary | ICD-10-CM

## 2023-03-31 DIAGNOSIS — D72829 Elevated white blood cell count, unspecified: Secondary | ICD-10-CM | POA: Diagnosis not present

## 2023-03-31 MED ORDER — SODIUM CHLORIDE 0.9 % IV SOLN
510.0000 mg | Freq: Once | INTRAVENOUS | Status: AC
  Administered 2023-03-31: 510 mg via INTRAVENOUS
  Filled 2023-03-31: qty 510

## 2023-03-31 MED ORDER — SODIUM CHLORIDE 0.9 % IV SOLN: INTRAVENOUS | Status: DC

## 2023-03-31 NOTE — Patient Instructions (Signed)
 Ferumoxytol Injection What is this medication? FERUMOXYTOL (FER ue MOX i tol) treats low levels of iron in your body (iron deficiency anemia). Iron is a mineral that plays an important role in making red blood cells, which carry oxygen from your lungs to the rest of your body. This medicine may be used for other purposes; ask your health care provider or pharmacist if you have questions. COMMON BRAND NAME(S): Feraheme What should I tell my care team before I take this medication? They need to know if you have any of these conditions: Anemia not caused by low iron levels High levels of iron in the blood Magnetic resonance imaging (MRI) test scheduled An unusual or allergic reaction to iron, other medications, foods, dyes, or preservatives Pregnant or trying to get pregnant Breastfeeding How should I use this medication? This medication is injected into a vein. It is given by your care team in a hospital or clinic setting. Talk to your care team the use of this medication in children. Special care may be needed. Overdosage: If you think you have taken too much of this medicine contact a poison control center or emergency room at once. NOTE: This medicine is only for you. Do not share this medicine with others. What if I miss a dose? It is important not to miss your dose. Call your care team if you are unable to keep an appointment. What may interact with this medication? Other iron products This list may not describe all possible interactions. Give your health care provider a list of all the medicines, herbs, non-prescription drugs, or dietary supplements you use. Also tell them if you smoke, drink alcohol, or use illegal drugs. Some items may interact with your medicine. What should I watch for while using this medication? Visit your care team regularly. Tell your care team if your symptoms do not start to get better or if they get worse. You may need blood work done while you are taking this  medication. You may need to follow a special diet. Talk to your care team. Foods that contain iron include: whole grains/cereals, dried fruits, beans, or peas, leafy green vegetables, and organ meats (liver, kidney). What side effects may I notice from receiving this medication? Side effects that you should report to your care team as soon as possible: Allergic reactions--skin rash, itching, hives, swelling of the face, lips, tongue, or throat Low blood pressure--dizziness, feeling faint or lightheaded, blurry vision Shortness of breath Side effects that usually do not require medical attention (report to your care team if they continue or are bothersome): Flushing Headache Joint pain Muscle pain Nausea Pain, redness, or irritation at injection site This list may not describe all possible side effects. Call your doctor for medical advice about side effects. You may report side effects to FDA at 1-800-FDA-1088. Where should I keep my medication? This medication is given in a hospital or clinic. It will not be stored at home. NOTE: This sheet is a summary. It may not cover all possible information. If you have questions about this medicine, talk to your doctor, pharmacist, or health care provider.  2024 Elsevier/Gold Standard (2022-08-23 00:00:00)

## 2023-04-23 DIAGNOSIS — R600 Localized edema: Secondary | ICD-10-CM | POA: Diagnosis not present

## 2023-04-23 DIAGNOSIS — Z79899 Other long term (current) drug therapy: Secondary | ICD-10-CM | POA: Diagnosis not present

## 2023-04-23 DIAGNOSIS — G4733 Obstructive sleep apnea (adult) (pediatric): Secondary | ICD-10-CM | POA: Diagnosis not present

## 2023-04-23 DIAGNOSIS — Z6824 Body mass index (BMI) 24.0-24.9, adult: Secondary | ICD-10-CM | POA: Diagnosis not present

## 2023-04-23 DIAGNOSIS — R911 Solitary pulmonary nodule: Secondary | ICD-10-CM | POA: Diagnosis not present

## 2023-04-23 DIAGNOSIS — I6789 Other cerebrovascular disease: Secondary | ICD-10-CM | POA: Diagnosis not present

## 2023-04-23 DIAGNOSIS — I471 Supraventricular tachycardia, unspecified: Secondary | ICD-10-CM | POA: Diagnosis not present

## 2023-04-23 DIAGNOSIS — D519 Vitamin B12 deficiency anemia, unspecified: Secondary | ICD-10-CM | POA: Diagnosis not present

## 2023-04-23 DIAGNOSIS — G8191 Hemiplegia, unspecified affecting right dominant side: Secondary | ICD-10-CM | POA: Diagnosis not present

## 2023-04-23 DIAGNOSIS — E041 Nontoxic single thyroid nodule: Secondary | ICD-10-CM | POA: Diagnosis not present

## 2023-04-23 DIAGNOSIS — E039 Hypothyroidism, unspecified: Secondary | ICD-10-CM | POA: Diagnosis not present

## 2023-04-23 DIAGNOSIS — D638 Anemia in other chronic diseases classified elsewhere: Secondary | ICD-10-CM | POA: Diagnosis not present

## 2023-04-23 DIAGNOSIS — E785 Hyperlipidemia, unspecified: Secondary | ICD-10-CM | POA: Diagnosis not present

## 2023-04-24 ENCOUNTER — Ambulatory Visit: Payer: Medicare Other | Admitting: Emergency Medicine

## 2023-04-24 ENCOUNTER — Encounter: Payer: Self-pay | Admitting: Emergency Medicine

## 2023-04-24 VITALS — BP 154/69 | HR 60 | Ht 72.0 in | Wt 166.6 lb

## 2023-04-24 DIAGNOSIS — J432 Centrilobular emphysema: Secondary | ICD-10-CM

## 2023-04-24 DIAGNOSIS — R600 Localized edema: Secondary | ICD-10-CM

## 2023-04-24 DIAGNOSIS — R918 Other nonspecific abnormal finding of lung field: Secondary | ICD-10-CM

## 2023-04-24 DIAGNOSIS — G4733 Obstructive sleep apnea (adult) (pediatric): Secondary | ICD-10-CM | POA: Diagnosis not present

## 2023-04-24 HISTORY — DX: Centrilobular emphysema: J43.2

## 2023-04-24 HISTORY — DX: Other nonspecific abnormal finding of lung field: R91.8

## 2023-04-24 NOTE — Progress Notes (Signed)
Cardiology Office Note:    Date:  04/25/2023   ID:  Jimmy Grant., DOB [birth year], MRN [id number]  PCP:  Paulina Fusi, MD  Cardiologist:  Norman Herrlich, MD    Referring MD: Paulina Fusi, MD    ASSESSMENT:    1. Frequent PVCs   2. On amiodarone therapy   3. Acquired hypothyroidism   4. Primary hypertension   5. Pulmonary nodules    PLAN:    In order of problems listed above:  Mr. Jimmy Grant is improved combination of low-dose amiodarone's magnesium supplementation and can continue the same Continue amiodarone no toxicity Stable hypothyroidism managed with his PCP.  Last TSH 1.5 Hypertension well-controlled continue his ARB\ Pending further evaluation pulmonary his daughter will look to handcarried the reports and if she can the images downloaded DVT or have them transmitted to the PACS system for review   Next appointment: 6 months   Medication Adjustments/Labs and Tests Ordered: Current medicines are reviewed at length with the patient today.  Concerns regarding medicines are outlined above.  Orders Placed This Encounter  Procedures   EKG 12-Lead   No orders of the defined types were placed in this encounter.    History of Present Illness:    Jimmy Grant. is a 89 y.o. male with a hx of frequent and symptomatic PVCs suppressed with amiodarone hypertension hyperlipidemia and hypothyroidism related to amiodarone therapy last seen 01/23/2023.  An echocardiogram 02/13/2023 ejection fraction remained normal 55 to 60% with grade 1 for pattern normal right ventricular size and function.  An event monitor report [id number] [id number] which showed occasional ventricular ectopy burden 4.1% with couplets and rare triplets.  Interestingly his initial monitor March 2022 showed more frequent PVCs 9.4%.  He had a recent CT of the chest performed 04/22/2022 abnormal 2 abutting nodules in the right lower lobe measuring 2.4 x 2.4 3.4 x 2.1 cm also had a right  thyroid nodule 8 mm and biopsy of the lung nodules was recommended.  Compliance with diet, lifestyle and medications: Daughter is present and participating in the visit he is compliant with his medications In the last month he has markedly improved with magnesium and amiodarone and is not having disruptive palpitation Otherwise well no edema shortness of breath chest pain or syncope Pending another CT scan and follow-up with pulmonary.  I asked his daughter to be sure she gets a copy of the report to hand carry and see if the images can be downloaded on a DVD or sent directly to the PACS system so they can be reviewed Past Medical History:  Diagnosis Date   'Light-for-dates' infant with signs of fetal malnutrition 08/19/2016   Acute respiratory disease due to COVID-19 virus 04/07/2019   Anemia 10/17/2017   Branch retinal vein occlusion    Cardiac arrhythmia 07/23/2016   Choroidal nevus, right 11/13/2017   Dyslipidemia    Edema of both legs    Frequent PVCs 09/25/2020   Glaucoma    History of prostate cancer    Hypercholesterolemia 06/08/2018   Hypertension 07/23/2016   Intermediate stage nonexudative age-related macular degeneration of both eyes 02/01/2021   Iritis, recurrent, bilateral 12/09/2013   Late effects of CVA (cerebrovascular accident)    Leukopenia 10/17/2017   Lumbar disc narrowing    Macular edema 07/16/2012   Malignant neoplasm of prostate (HCC) 03/17/2020   Mixed hyperlipidemia 06/02/2020   OSA on CPAP    Osteoarthritis of right glenohumeral joint    PAC (premature  atrial contraction) 06/02/2020   Primary hypertension 06/02/2020   Primary open angle glaucoma (POAG) of both eyes, moderate stage 07/16/2012   Prostate cancer (HCC)    PSVT (paroxysmal supraventricular tachycardia) (HCC) 08/12/2005   PVC (premature ventricular contraction) 06/02/2020   Status post intraocular lens implant 07/16/2012   Thyroid nodule    Vitamin B12 deficiency     Current  Medications: Current Meds  Medication Sig   amiodarone (PACERONE) 200 MG tablet Take 1 tablet (200 mg total) by mouth daily.   aspirin EC 81 MG tablet Take 81 mg by mouth daily.   Azelastine HCl 0.15 % SOLN Place 2 sprays into both nostrils daily.   calcium-vitamin D (OSCAL WITH D) 500-200 MG-UNIT tablet Take 1 tablet by mouth daily.   DIOVAN 160 MG tablet Take 160 mg by mouth 2 (two) times daily.    dorzolamide-timolol (COSOPT) 22.3-6.8 MG/ML ophthalmic solution Place 1 drop into both eyes 2 (two) times daily.   Glucosamine-Chondroitin (GLUCOSAMINE CHONDR COMPLEX PO) Take 1 tablet by mouth daily.   latanoprost (XALATAN) 0.005 % ophthalmic solution Place 1 drop into both eyes at bedtime.   levothyroxine (SYNTHROID) 125 MCG tablet Take 125 mcg by mouth daily.   Magnesium 200 MG TABS Take 1 tablet (200 mg total) by mouth daily.   Multiple Vitamins-Minerals (PRESERVISION AREDS 2+MULTI VIT) CAPS Take 1 capsule by mouth 2 (two) times daily.   simvastatin (ZOCOR) 20 MG tablet Take 20 mg by mouth every evening.      EKGs/Labs/Other Studies Reviewed:    The following studies were reviewed today:  Cardiac Studies & Procedures      ECHOCARDIOGRAM  ECHOCARDIOGRAM COMPLETE 02/13/2023  Narrative ECHOCARDIOGRAM REPORT    Patient Name:   Jimmy Grant. Date of Exam: 02/13/2023 Medical Rec #:  [id number]                  Height:       71.6 in Accession #:    [id number]                 Weight:       169.4 lb Date of Birth:  1924/06/30                  BSA:          1.978 m Patient Age:    89 years                   BP:           114/54 mmHg Patient Gender: M                          HR:           61 bpm. Exam Location:  [facility]  Procedure: 2D Echo, Cardiac Doppler, Color Doppler and Strain Analysis  Indications:    Frequent PVCs [I49.3 (ICD-10-CM)]; On amiodarone therapy [Z79.899 (ICD-10-CM)]; Acquired hypothyroidism [E03.9 (ICD-10-CM)]; Primary hypertension [I10 (ICD-10-CM)];  Mixed hyperlipidemia [E78.2 (ICD-10-CM)]  History:        Patient has prior history of Echocardiogram examinations, most recent 07/03/2020.  Sonographer:    Louie Boston RDCS Referring Phys: [id number] Osric Klopf J Vivika Poythress  IMPRESSIONS   1. Left ventricular ejection fraction, by estimation, is 55 to 60%. The left ventricle has normal function. The left ventricle has no regional wall motion abnormalities. Left ventricular diastolic parameters are consistent with Grade I diastolic dysfunction (impaired relaxation). The average  left ventricular global longitudinal strain is -12.7 %. The global longitudinal strain is abnormal. 2. Right ventricular systolic function is normal. The right ventricular size is normal. There is normal pulmonary artery systolic pressure. 3. The mitral valve is grossly normal. No evidence of mitral valve regurgitation. No evidence of mitral stenosis. 4. The aortic valve is tricuspid. There is mild thickening of the aortic valve. Aortic valve regurgitation is not visualized. No aortic stenosis is present. 5. The inferior vena cava is normal in size with greater than 50% respiratory variability, suggesting right atrial pressure of 3 mmHg. 6. Descending aorta diameter 2.3cm on subcostal view.  Comparison(s): A prior study was performed on 07/03/2020. No significant change from prior study.  FINDINGS Left Ventricle: Left ventricular ejection fraction, by estimation, is 55 to 60%. The left ventricle has normal function. The left ventricle has no regional wall motion abnormalities. The average left ventricular global longitudinal strain is -12.7 %. The global longitudinal strain is abnormal. The left ventricular internal cavity size was normal in size. There is no left ventricular hypertrophy. Left ventricular diastolic parameters are consistent with Grade I diastolic dysfunction (impaired relaxation).  Right Ventricle: The right ventricular size is normal. Right vetricular wall  thickness was not well visualized. Right ventricular systolic function is normal. There is normal pulmonary artery systolic pressure. The tricuspid regurgitant velocity is 1.48 m/s, and with an assumed right atrial pressure of 3 mmHg, the estimated right ventricular systolic pressure is 11.8 mmHg.  Left Atrium: Left atrial size was normal in size.  Right Atrium: Right atrial size was normal in size.  Pericardium: There is no evidence of pericardial effusion.  Mitral Valve: The mitral valve is grossly normal. No evidence of mitral valve regurgitation. No evidence of mitral valve stenosis.  Tricuspid Valve: The tricuspid valve is not well visualized. Tricuspid valve regurgitation is trivial.  Aortic Valve: The aortic valve is tricuspid. There is mild thickening of the aortic valve. Aortic valve regurgitation is not visualized. No aortic stenosis is present.  Pulmonic Valve: The pulmonic valve was not well visualized. Pulmonic valve regurgitation is trivial. No evidence of pulmonic stenosis.  Aorta: The aortic root and ascending aorta are structurally normal, with no evidence of dilitation.  Venous: The inferior vena cava is normal in size with greater than 50% respiratory variability, suggesting right atrial pressure of 3 mmHg.  IAS/Shunts: The interatrial septum was not well visualized.   LEFT VENTRICLE PLAX 2D LVIDd:         4.50 cm     Diastology LVIDs:         3.90 cm     LV e' medial:    4.90 cm/s LV PW:         0.90 cm     LV E/e' medial:  10.2 LV IVS:        1.10 cm     LV e' lateral:   5.34 cm/s LVOT diam:     2.00 cm     LV E/e' lateral: 9.3 LV SV:         62 LV SV Index:   31          2D Longitudinal Strain LVOT Area:     3.14 cm    2D Strain GLS Avg:     -12.7 %  LV Volumes (MOD) LV vol d, MOD A2C: 65.7 ml LV vol d, MOD A4C: 43.3 ml LV vol s, MOD A2C: 36.5 ml LV vol s, MOD A4C: 27.2 ml LV  SV MOD A2C:     29.2 ml LV SV MOD A4C:     43.3 ml LV SV MOD BP:      24.0  ml  RIGHT VENTRICLE            IVC RV Basal diam:  3.00 cm    IVC diam: 2.10 cm RV S prime:     9.73 cm/s TAPSE (M-mode): 1.4 cm  LEFT ATRIUM             Index        RIGHT ATRIUM           Index LA diam:        2.20 cm 1.11 cm/m   RA Area:     11.50 cm LA Vol (A2C):   48.5 ml 24.53 ml/m  RA Volume:   30.20 ml  15.27 ml/m LA Vol (A4C):   33.7 ml 17.04 ml/m LA Biplane Vol: 44.0 ml 22.25 ml/m AORTIC VALVE LVOT Vmax:   97.95 cm/s LVOT Vmean:  62.100 cm/s LVOT VTI:    0.197 m  AORTA Ao Root diam: 3.00 cm Ao Asc diam:  3.50 cm Ao Desc diam: 2.20 cm  MV E velocity: 49.80 cm/s  TRICUSPID VALVE MV A velocity: 78.10 cm/s  TR Peak grad:   8.8 mmHg MV E/A ratio:  0.64        TR Vmax:        148.00 cm/s  SHUNTS Systemic VTI:  0.20 m Systemic Diam: 2.00 cm  Sreedhar reddy Madireddy Electronically signed by Vern Claude reddy Madireddy Signature Date/Time: 02/13/2023/1:03:01 PM    Final   MONITORS  LONG TERM MONITOR (3-14 DAYS) 02/11/2023  Narrative Patch Wear Time:  7 days and 0 hours (2024-10-24T10:32:08-398 to 2024-10-31T11:06:04-398)  Patient had a min HR of 47 bpm, max HR of 96 bpm, and avg HR of 65 bpm. Predominant underlying rhythm was Sinus Rhythm.  There were 48 triggered events all sinus rhythm and associated with PVCs generally frequent often bigeminy trigeminy couplets and 1 brief episode of accelerated idioventricular rhythm.  There were no pauses of 3 seconds or greater no episodes of second or third-degree AV nodal block.  There are no episodes of atrial fibrillation or flutter.  Idioventricular Rhythm was present. Idioventricular Rhythm was detected within +/- 45 seconds of symptomatic patient event(s).  Isolated SVEs were rare (<1.0%), SVE Couplets were rare (<1.0%), and no SVE Triplets were present.  Isolated VEs were occasional (4.1%, [id number]), VE Couplets were occasional (2.7%, 8397), and VE Triplets were rare (<1.0%, 3). Ventricular Bigeminy and  Trigeminy were present.          EKG Interpretation Date/Time:  Friday April 25 2023 10:19:28 EST Ventricular Rate:  69 PR Interval:  200 QRS Duration:  122 QT Interval:  424 QTC Calculation: 454 R Axis:   -51  Text Interpretation: Normal sinus rhythm Left axis deviation Left bundle branch block REPOLARIZATION ABNORMALITY Abnormal ECG When compared with ECG of 23-Jan-2023 09:49, Unchanged Confirmed by Norman Herrlich ([id number]) on 04/25/2023 10:32:48 AM    Recent Labs: 03/19/2023: Hemoglobin 10.9; Platelet Count 115  Recent Lipid Panel    Component Value Date/Time   TRIG 124 04/07/2019 1039    Physical Exam:    VS:  BP (!) 108/54 (BP Location: Left Arm, Patient Position: Sitting)   Pulse 64   Ht 5\' 11"  (1.803 m)   Wt 167 lb 9.6 oz (76 kg)   SpO2 100%   BMI 23.38 kg/m  Wt Readings from Last 3 Encounters:  04/25/23 167 lb 9.6 oz (76 kg)  04/24/23 166 lb 9.6 oz (75.6 kg)  03/31/23 171 lb 8 oz (77.8 kg)     GEN:  Well nourished, well developed in no acute distress HEENT: Normal NECK: No JVD; No carotid bruits LYMPHATICS: No lymphadenopathy CARDIAC: RRR, no murmurs, rubs, gallops RESPIRATORY:  Clear to auscultation without rales, wheezing or rhonchi  ABDOMEN: Soft, non-tender, non-distended MUSCULOSKELETAL:  No edema; No deformity  SKIN: Warm and dry NEUROLOGIC:  Alert and oriented x 3 PSYCHIATRIC:  Normal affect    Signed, Norman Herrlich, MD  04/25/2023 10:59 AM    [facility]

## 2023-04-24 NOTE — Progress Notes (Signed)
Subjective:    Patient ID: Jimmy Grant., male    DOB: 14-Jul-1924, 89 y.o.   MRN: [id number]  HPI Jimmy Grant, a 89 year old former smoker with a 40 pack-year history, has a medical history significant for prostate cancer, hypertension, cerebrovascular accident, obstructive sleep apnea managed with CPAP, glaucoma, and cardiac arrhythmias (PVCs and PSVT). He was referred for an abnormal CT scan of the chest. The patient has been under the care of an oncologist for mild leukopenia and borderline pancytopenia.  The patient reported spitting up blood approximately three to four days prior to the imaging, which was conducted in November 2024.  The bleeding episode lasted about three days, with spotting noted in a handkerchief. There were no associated symptoms of a cold or flu. The patient reported one more episode of bleeding three weeks ago.  Some question as to whether this was related to epistaxis.  He underwent a CT scan of the chest that showed pulmonary nodules as described below in the right lower lobe suspicious for possible malignancy.  The patient has a history of working in Designer, fashion/clothing and has had mild exposure to cotton dust. He has also been involved in woodworking as a hobby. There is no known exposure to tuberculosis or mold. The patient resides in a retirement community and has minimal exposure to animals.  The patient also reported swelling in the ankles, which he attributes to prolonged sitting. He has a history of a skin shin on the left leg, which is currently bandaged. The patient's CT scan from [facility] showed no mediastinal or hilar adenopathy, but there was diffuse central lobular emphysema. Two abutting nodules were noted in the right lower lobe, and a visceral nodule was identified at the right lung base.   RADIOLOGY CT chest: No mediastinal or hilar lymphadenopathy. Diffuse centrilobular emphysema. Two abutting nodules in the right lower lobe measuring 2.4 x  2.4 cm and 3.4 x 2.1 cm respectively. Visceral nodule at the right lung base, 2.1 x 1.2 cm. (02/21/2023)   Review of Systems As per HPI  Past Medical History:  Diagnosis Date   'Light-for-dates' infant with signs of fetal malnutrition 08/19/2016   Acute respiratory disease due to COVID-19 virus 04/07/2019   Anemia 10/17/2017   Branch retinal vein occlusion    Cardiac arrhythmia 07/23/2016   Choroidal nevus, right 11/13/2017   Dyslipidemia    Edema of both legs    Frequent PVCs 09/25/2020   Glaucoma    History of prostate cancer    Hypercholesterolemia 06/08/2018   Hypertension 07/23/2016   Intermediate stage nonexudative age-related macular degeneration of both eyes 02/01/2021   Iritis, recurrent, bilateral 12/09/2013   Late effects of CVA (cerebrovascular accident)    Leukopenia 10/17/2017   Lumbar disc narrowing    Macular edema 07/16/2012   Malignant neoplasm of prostate (HCC) 03/17/2020   Mixed hyperlipidemia 06/02/2020   OSA on CPAP    Osteoarthritis of right glenohumeral joint    PAC (premature atrial contraction) 06/02/2020   Primary hypertension 06/02/2020   Primary open angle glaucoma (POAG) of both eyes, moderate stage 07/16/2012   Prostate cancer (HCC)    PSVT (paroxysmal supraventricular tachycardia) (HCC) 08/12/2005   PVC (premature ventricular contraction) 06/02/2020   Status post intraocular lens implant 07/16/2012   Thyroid nodule    Vitamin B12 deficiency      Family History  Problem Relation Age of Onset   Diabetes Mother    Hypertension Mother    Kidney disease Father  Social History   Socioeconomic History   Marital status: Married    Spouse name: Not on file   Number of children: Not on file   Years of education: Not on file   Highest education level: Not on file  Occupational History   Occupation: retired  Tobacco Use   Smoking status: Former    Current packs/day: 0.00    Average packs/day: 1 pack/day for 40.0 years (40.0 ttl  pk-yrs)    Types: Cigarettes    Start date: 76    Quit date: 1971    Years since quitting: 54.0    Passive exposure: Past   Smokeless tobacco: Never  Vaping Use   Vaping status: Never Used  Substance and Sexual Activity   Alcohol use: Yes    Alcohol/week: 7.0 standard drinks of alcohol    Types: 7 Glasses of wine per week   Drug use: Never   Sexual activity: Not on file  Other Topics Concern   Not on file  Social History Narrative   Not on file   Social Drivers of Health   Financial Resource Strain: Not on file  Food Insecurity: Not on file  Transportation Needs: Not on file  Physical Activity: Not on file  Stress: Not on file  Social Connections: Not on file  Intimate Partner Violence: Not on file    - The patient is a former smoker (40 pack years) - Worked in Customer service manager as a hobby  No Known Allergies   Outpatient Medications Prior to Visit  Medication Sig Dispense Refill   amiodarone (PACERONE) 200 MG tablet Take 1 tablet (200 mg total) by mouth daily. 90 tablet 3   aspirin EC 81 MG tablet Take 81 mg by mouth daily.     Azelastine HCl 0.15 % SOLN Place 2 sprays into both nostrils daily.     calcium-vitamin D (OSCAL WITH D) 500-200 MG-UNIT tablet Take 1 tablet by mouth daily.     DIOVAN 160 MG tablet Take 160 mg by mouth 2 (two) times daily.      dorzolamide-timolol (COSOPT) 22.3-6.8 MG/ML ophthalmic solution Place 1 drop into both eyes 2 (two) times daily.     Glucosamine-Chondroitin (GLUCOSAMINE CHONDR COMPLEX PO) Take 1 tablet by mouth daily.     latanoprost (XALATAN) 0.005 % ophthalmic solution Place 1 drop into both eyes at bedtime.     levothyroxine (SYNTHROID) 125 MCG tablet Take 125 mcg by mouth daily.     Magnesium 200 MG TABS Take 1 tablet (200 mg total) by mouth daily. 90 tablet 3   Multiple Vitamins-Minerals (PRESERVISION AREDS 2+MULTI VIT) CAPS Take 1 capsule by mouth 2 (two) times daily.     simvastatin (ZOCOR) 20 MG tablet Take 20 mg by  mouth every evening.     No facility-administered medications prior to visit.        Objective:   Physical Exam Vitals:   04/24/23 1021  BP: (!) 154/69  Pulse: 60  SpO2: 96%  Weight: 166 lb 9.6 oz (75.6 kg)  Height: 6' (1.829 m)   Gen: Pleasant, Elderly gentleman, in no distress,  normal affect  ENT: No lesions,  mouth clear,  oropharynx clear, no postnasal drip  Neck: No JVD, no stridor  Lungs: No use of accessory muscles, no crackles or wheezing on normal respiration, no wheeze on forced expiration  Cardiovascular: RRR, heart sounds normal, no murmur or gallops, bilateral ankle edema left greater than right  Musculoskeletal: No deformities, no cyanosis or  clubbing  Neuro: alert, awake, non focal  Skin: Warm, no lesions or rash       Assessment & Plan:  Pulmonary nodules 2 adjacent solid nodules at the right base.  Appearance is concerning for possible malignancy and he does have a tobacco history, history of prostate cancer.  I would like to further characterize with a PET scan now.  I will order this at [facility] and we will review after it is completed.  Depending on that result we will need to talk about the pros and cons of possible diagnostics including TTNA or bronchoscopy.  At age 35 we may want to avoid general anesthesia if possible.  We will discuss possible strategies including watchful waiting with serial imaging, diagnostic procedure, possibly even referral to radiation oncology depending on the PET scan results.  OSA on CPAP Continue same  Centrilobular emphysema (HCC) Noted on his CT chest Diffuse Central Lobular Emphysema Noted on CT scan. No acute respiratory symptoms reported. -Continue current management.   Edema of both legs Lower Extremity Edema Noted more on the left than the right. Patient reports increased sitting time. -Advise patient to elevate legs when sitting.   Levy Pupa, MD, PhD 04/24/2023, 1:35 PM [facility] [phone] or if no answer before 7:00PM call [phone] For any issues after 7:00PM please call eLink [phone]

## 2023-04-24 NOTE — Assessment & Plan Note (Signed)
Lower Extremity Edema Noted more on the left than the right. Patient reports increased sitting time. -Advise patient to elevate legs when sitting.

## 2023-04-24 NOTE — Assessment & Plan Note (Signed)
2 adjacent solid nodules at the right base.  Appearance is concerning for possible malignancy and he does have a tobacco history, history of prostate cancer.  I would like to further characterize with a PET scan now.  I will order this at [facility] and we will review after it is completed.  Depending on that result we will need to talk about the pros and cons of possible diagnostics including TTNA or bronchoscopy.  At age 89 we may want to avoid general anesthesia if possible.  We will discuss possible strategies including watchful waiting with serial imaging, diagnostic procedure, possibly even referral to radiation oncology depending on the PET scan results.

## 2023-04-24 NOTE — Assessment & Plan Note (Signed)
Continue same

## 2023-04-24 NOTE — Assessment & Plan Note (Signed)
Noted on his CT chest Diffuse Central Lobular Emphysema Noted on CT scan. No acute respiratory symptoms reported. -Continue current management.

## 2023-04-24 NOTE — Patient Instructions (Signed)
We reviewed your CT scan of the chest from 02/21/2023. We will perform a PET scan at [facility] to further evaluate pulmonary nodules Follow with Dr. Delton Coombes next available after your PET scan so we can review those results together.

## 2023-04-25 ENCOUNTER — Ambulatory Visit: Payer: Medicare Other | Attending: Cardiology | Admitting: Cardiology

## 2023-04-25 ENCOUNTER — Encounter: Payer: Self-pay | Admitting: Cardiology

## 2023-04-25 VITALS — BP 108/54 | HR 64 | Ht 71.0 in | Wt 167.6 lb

## 2023-04-25 DIAGNOSIS — Z79899 Other long term (current) drug therapy: Secondary | ICD-10-CM | POA: Diagnosis not present

## 2023-04-25 DIAGNOSIS — I493 Ventricular premature depolarization: Secondary | ICD-10-CM | POA: Diagnosis not present

## 2023-04-25 DIAGNOSIS — R918 Other nonspecific abnormal finding of lung field: Secondary | ICD-10-CM | POA: Diagnosis not present

## 2023-04-25 DIAGNOSIS — E039 Hypothyroidism, unspecified: Secondary | ICD-10-CM | POA: Diagnosis not present

## 2023-04-25 DIAGNOSIS — I10 Essential (primary) hypertension: Secondary | ICD-10-CM

## 2023-04-25 NOTE — Patient Instructions (Signed)
Medication Instructions:  Your physician recommends that you continue on your current medications as directed. Please refer to the Current Medication list given to you today.  *If you need a refill on your cardiac medications before your next appointment, please call your pharmacy*   Lab Work: None If you have labs (blood work) drawn today and your tests are completely normal, you will receive your results only by: MyChart Message (if you have MyChart) OR A paper copy in the mail If you have any lab test that is abnormal or we need to change your treatment, we will call you to review the results.   Testing/Procedures: None   Follow-Up: At [facility], you and your health needs are our priority.  As part of our continuing mission to provide you with exceptional heart care, we have created designated Provider Care Teams.  These Care Teams include your primary Cardiologist (physician) and Advanced Practice Providers (APPs -  Physician Assistants and Nurse Practitioners) who all work together to provide you with the care you need, when you need it.  We recommend signing up for the patient portal called "MyChart".  Sign up information is provided on this After Visit Summary.  MyChart is used to connect with patients for Virtual Visits (Telemedicine).  Patients are able to view lab/test results, encounter notes, upcoming appointments, etc.  Non-urgent messages can be sent to your provider as well.   To learn more about what you can do with MyChart, go to [email or web address].    Your next appointment:   6 month(s)  Provider:   Norman Herrlich, MD    Other Instructions None

## 2023-05-01 DIAGNOSIS — S81812A Laceration without foreign body, left lower leg, initial encounter: Secondary | ICD-10-CM | POA: Diagnosis not present

## 2023-05-16 ENCOUNTER — Telehealth: Payer: Self-pay | Admitting: Emergency Medicine

## 2023-05-16 NOTE — Telephone Encounter (Signed)
Patient has not heard from [facility] to schedule his Pet scan. Patient's appt with Dr. Delton Coombes is scheduled for 05/28/2023. Please contact [facility] and give patient an update. Informed patient we may have to push his appointment with [facility] back.

## 2023-05-16 NOTE — Telephone Encounter (Signed)
Pt has been scheduled with [facility] on 05/21/23. Follow up with Dr. Delton Coombes does not need to be pushed back. Pt is also informed of the appointment.

## 2023-05-21 DIAGNOSIS — R918 Other nonspecific abnormal finding of lung field: Secondary | ICD-10-CM | POA: Diagnosis not present

## 2023-05-28 ENCOUNTER — Encounter: Payer: Self-pay | Admitting: Emergency Medicine

## 2023-05-28 ENCOUNTER — Ambulatory Visit: Payer: Medicare Other | Admitting: Emergency Medicine

## 2023-05-28 VITALS — BP 128/70 | HR 82 | Temp 98.2°F | Ht 71.0 in | Wt 164.2 lb

## 2023-05-28 DIAGNOSIS — D519 Vitamin B12 deficiency anemia, unspecified: Secondary | ICD-10-CM | POA: Diagnosis not present

## 2023-05-28 DIAGNOSIS — R918 Other nonspecific abnormal finding of lung field: Secondary | ICD-10-CM | POA: Diagnosis not present

## 2023-05-28 NOTE — Progress Notes (Signed)
 Subjective:    Patient ID: Jimmy Cooter., male    DOB: 03/11/1925, 89 y.o.   MRN: [id number]  HPI  ROV 05/28/2023 --follow-up visit for 89 year old gentleman with history of tobacco use, prostate cancer, OSA on CPAP, leukopenia.  I saw him 1 month ago in January for an abnormal CT scan of the chest that showed emphysema, 2 abutting right lower lobe nodules. We arranged for PET scan to further evaluate. He reports today that he is feeling well. He feels some pain over in the R flank, almost rib pain  PET scan performed 05/21/2023 reviewed by me shows partial (almost complete) resolution of the 2 adjacent right lower lobe nodules with some associated hypermetabolism in these areas consistent with resolving inflammation or infection.  This is inconsistent with malignancy. There is some intracostal hypermetabolism of ? significance    Review of Systems As per HPI  Past Medical History:  Diagnosis Date   'Light-for-dates' infant with signs of fetal malnutrition 08/19/2016   Acute respiratory disease due to COVID-19 virus 04/07/2019   Anemia 10/17/2017   Branch retinal vein occlusion    Cardiac arrhythmia 07/23/2016   Choroidal nevus, right 11/13/2017   Dyslipidemia    Edema of both legs    Frequent PVCs 09/25/2020   Glaucoma    History of prostate cancer    Hypercholesterolemia 06/08/2018   Hypertension 07/23/2016   Intermediate stage nonexudative age-related macular degeneration of both eyes 02/01/2021   Iritis, recurrent, bilateral 12/09/2013   Late effects of CVA (cerebrovascular accident)    Leukopenia 10/17/2017   Lumbar disc narrowing    Macular edema 07/16/2012   Malignant neoplasm of prostate (HCC) 03/17/2020   Mixed hyperlipidemia 06/02/2020   OSA on CPAP    Osteoarthritis of right glenohumeral joint    PAC (premature atrial contraction) 06/02/2020   Primary hypertension 06/02/2020   Primary open angle glaucoma (POAG) of both eyes, moderate stage 07/16/2012    Prostate cancer (HCC)    PSVT (paroxysmal supraventricular tachycardia) (HCC) 08/12/2005   PVC (premature ventricular contraction) 06/02/2020   Status post intraocular lens implant 07/16/2012   Thyroid nodule    Vitamin B12 deficiency      Family History  Problem Relation Age of Onset   Diabetes Mother    Hypertension Mother    Kidney disease Father      Social History   Socioeconomic History   Marital status: Married    Spouse name: Not on file   Number of children: Not on file   Years of education: Not on file   Highest education level: Not on file  Occupational History   Occupation: retired  Tobacco Use   Smoking status: Former    Current packs/day: 0.00    Average packs/day: 1 pack/day for 40.0 years (40.0 ttl pk-yrs)    Types: Cigarettes    Start date: 18    Quit date: 1971    Years since quitting: 54.1    Passive exposure: Past   Smokeless tobacco: Never  Vaping Use   Vaping status: Never Used  Substance and Sexual Activity   Alcohol use: Yes    Alcohol/week: 7.0 standard drinks of alcohol    Types: 7 Glasses of wine per week   Drug use: Never   Sexual activity: Not on file  Other Topics Concern   Not on file  Social History Narrative   Not on file   Social Drivers of Health   Financial Resource Strain: Not on file  Food Insecurity: Not on file  Transportation Needs: Not on file  Physical Activity: Not on file  Stress: Not on file  Social Connections: Not on file  Intimate Partner Violence: Not on file    - The patient is a former smoker (40 pack years) - Worked in Customer service manager as a hobby  No Known Allergies   Outpatient Medications Prior to Visit  Medication Sig Dispense Refill   amiodarone (PACERONE) 200 MG tablet Take 1 tablet (200 mg total) by mouth daily. 90 tablet 3   aspirin EC 81 MG tablet Take 81 mg by mouth daily.     Azelastine HCl 0.15 % SOLN Place 2 sprays into both nostrils daily.     calcium-vitamin D (OSCAL WITH  D) 500-200 MG-UNIT tablet Take 1 tablet by mouth daily.     DIOVAN 160 MG tablet Take 160 mg by mouth 2 (two) times daily.      dorzolamide-timolol (COSOPT) 22.3-6.8 MG/ML ophthalmic solution Place 1 drop into both eyes 2 (two) times daily.     Glucosamine-Chondroitin (GLUCOSAMINE CHONDR COMPLEX PO) Take 1 tablet by mouth daily.     latanoprost (XALATAN) 0.005 % ophthalmic solution Place 1 drop into both eyes at bedtime.     levothyroxine (SYNTHROID) 125 MCG tablet Take 125 mcg by mouth daily.     Magnesium 200 MG TABS Take 1 tablet (200 mg total) by mouth daily. 90 tablet 3   Multiple Vitamins-Minerals (PRESERVISION AREDS 2+MULTI VIT) CAPS Take 1 capsule by mouth 2 (two) times daily.     simvastatin (ZOCOR) 20 MG tablet Take 20 mg by mouth every evening.     No facility-administered medications prior to visit.        Objective:   Physical Exam Vitals:   05/28/23 1258  BP: 128/70  Pulse: 82  Temp: 98.2 F (36.8 C)  TempSrc: Oral  SpO2: 96%  Weight: 164 lb 3.2 oz (74.5 kg)  Height: 5\' 11"  (1.803 m)    Gen: Pleasant, Elderly gentleman, in no distress,  normal affect  ENT: No lesions,  mouth clear,  oropharynx clear, no postnasal drip  Neck: No JVD, no stridor  Lungs: No use of accessory muscles, no crackles or wheezing on normal respiration, no wheeze on forced expiration  Cardiovascular: RRR, heart sounds normal, no murmur or gallops, bilateral ankle edema left greater than right  Musculoskeletal: No deformities, no cyanosis or clubbing.  No tenderness in the right costal margin on palpation  Neuro: alert, awake, non focal  Skin: Warm, no lesions or rash       Assessment & Plan:  Pulmonary nodules I reviewed his CT chest and his PET scan both done at [facility].  The pulmonary nodules do look better, almost fully resolved with some nodular scarring still present.  The report from radiology notes that they did not see any significant hypermetabolism but it does look like  there is hypermetabolic activity in the nodular scar, also in the intercostal musculoskeletal region.  I did reassure him that the interval improvement would be inconsistent with malignancy.  I would like to repeat his CT scan of the chest in 6 months to ensure either full resolution or better characterization of the scarlike focus that remains.  He agrees with this plan.    Levy Pupa, MD, PhD 05/28/2023, 1:21 PM [facility] [phone] or if no answer before 7:00PM call [phone] For any issues after 7:00PM please call eLink [phone]

## 2023-05-28 NOTE — Patient Instructions (Signed)
 We will repeat your CT scan of the chest in August 2025 to compare with your priors. Follow Dr. Delton Coombes in August after your CT so we can review those results together

## 2023-05-28 NOTE — Assessment & Plan Note (Signed)
 I reviewed his CT chest and his PET scan both done at [facility].  The pulmonary nodules do look better, almost fully resolved with some nodular scarring still present.  The report from radiology notes that they did not see any significant hypermetabolism but it does look like there is hypermetabolic activity in the nodular scar, also in the intercostal musculoskeletal region.  I did reassure him that the interval improvement would be inconsistent with malignancy.  I would like to repeat his CT scan of the chest in 6 months to ensure either full resolution or better characterization of the scarlike focus that remains.  He agrees with this plan.

## 2023-06-04 ENCOUNTER — Ambulatory Visit: Admitting: Pulmonary Disease

## 2023-06-04 ENCOUNTER — Encounter: Payer: Self-pay | Admitting: Pulmonary Disease

## 2023-06-04 VITALS — HR 64 | Temp 98.1°F | Ht 71.0 in | Wt 162.2 lb

## 2023-06-04 DIAGNOSIS — G4733 Obstructive sleep apnea (adult) (pediatric): Secondary | ICD-10-CM | POA: Diagnosis not present

## 2023-06-04 NOTE — Patient Instructions (Signed)
 DME referral-[facility] patient Change pressure from CPAP of 11 to CPAP of 12  Follow-up in 3 months  Call us with significant concerns

## 2023-06-04 NOTE — Progress Notes (Signed)
 [address] [place]    [id number]    1924/11/17  Primary Care Physician:Schultz, Jonetta Speak, MD  Referring Physician: Paulina Fusi, MD [address] [place],  [place] [address]  Chief complaint:   History of obstructive sleep apnea Continues to use CPAP on a nightly basis  HPI:  Patient with obstructive sleep apnea diagnosed in 2017 on a CPAP of 11  He continues to do well Is concerned about his AHI readings  He keeps a log of his AHI in January average AHI was 2.9 in February his average AHI is going up to 5.9 He states his sleep feels about the same Wakes up feeling like he is at a good nights rest  He is just concerned about the numbers Sometimes he does see an AHI recording of 19  He has no specific complaints  Continues to tolerate CPAP well  His heart has been relatively stable, follows up with cardiology  States he never sleeps on his back for any length of time known to him Usually goes to bed on his left side, after couple of hours, wakes up and turns to his right side He gets up finally about 5 AM Recently upgraded his machine  Clinically does not feel any different Continues to feel well generally  He was diagnosed with mild obstructive sleep apnea titrated to a pressure of 9  He feels his sleep is restorative He does not wake up to use the bathroom He does have musculoskeletal pains and discomfort but does not feel this is contributing to his awakenings  Continues to keep a good log about his symptoms and CPAP use  Currently uses a full facemask  Outpatient Encounter Medications as of 06/04/2023  Medication Sig   amiodarone (PACERONE) 200 MG tablet Take 1 tablet (200 mg total) by mouth daily.   aspirin EC 81 MG tablet Take 81 mg by mouth daily.   Azelastine HCl 0.15 % SOLN Place 2 sprays into both nostrils daily.   calcium-vitamin D (OSCAL WITH D) 500-200 MG-UNIT tablet Take 1 tablet by mouth daily.   DIOVAN 160  MG tablet Take 160 mg by mouth 2 (two) times daily.    dorzolamide-timolol (COSOPT) 22.3-6.8 MG/ML ophthalmic solution Place 1 drop into both eyes 2 (two) times daily.   Glucosamine-Chondroitin (GLUCOSAMINE CHONDR COMPLEX PO) Take 1 tablet by mouth daily.   latanoprost (XALATAN) 0.005 % ophthalmic solution Place 1 drop into both eyes at bedtime.   levothyroxine (SYNTHROID) 125 MCG tablet Take 125 mcg by mouth daily.   Magnesium 200 MG TABS Take 1 tablet (200 mg total) by mouth daily.   Multiple Vitamins-Minerals (PRESERVISION AREDS 2+MULTI VIT) CAPS Take 1 capsule by mouth 2 (two) times daily.   simvastatin (ZOCOR) 20 MG tablet Take 20 mg by mouth every evening.   No facility-administered encounter medications on file as of 06/04/2023.    Allergies as of 06/04/2023   (No Known Allergies)    Past Medical History:  Diagnosis Date   'Light-for-dates' infant with signs of fetal malnutrition 08/19/2016   Acute respiratory disease due to COVID-19 virus 04/07/2019   Anemia 10/17/2017   Branch retinal vein occlusion    Cardiac arrhythmia 07/23/2016   Choroidal nevus, right 11/13/2017   Dyslipidemia    Edema of both legs    Frequent PVCs 09/25/2020   Glaucoma    History of prostate cancer    Hypercholesterolemia 06/08/2018   Hypertension 07/23/2016  Intermediate stage nonexudative age-related macular degeneration of both eyes 02/01/2021   Iritis, recurrent, bilateral 12/09/2013   Late effects of CVA (cerebrovascular accident)    Leukopenia 10/17/2017   Lumbar disc narrowing    Macular edema 07/16/2012   Malignant neoplasm of prostate (HCC) 03/17/2020   Mixed hyperlipidemia 06/02/2020   OSA on CPAP    Osteoarthritis of right glenohumeral joint    PAC (premature atrial contraction) 06/02/2020   Primary hypertension 06/02/2020   Primary open angle glaucoma (POAG) of both eyes, moderate stage 07/16/2012   Prostate cancer (HCC)    PSVT (paroxysmal supraventricular tachycardia) (HCC)  08/12/2005   PVC (premature ventricular contraction) 06/02/2020   Status post intraocular lens implant 07/16/2012   Thyroid nodule    Vitamin B12 deficiency     Past Surgical History:  Procedure Laterality Date   ADENOIDECTOMY  1931   CATARACT EXTRACTION Bilateral    Left on 05/04/96, Right on 05/08/1993   HEMORROIDECTOMY  11/21/2014   Done by Hardin Negus   PROSTATECTOMY  01/11/1999   Transurethral   TEAR DUCT PROBING Right 07/2018   Tear duct surgery / Dr. Dimas Millin   VIDEO ASSISTED THORACOSCOPY (VATS)/DECORTICATION  2012    Family History  Problem Relation Age of Onset   Diabetes Mother    Hypertension Mother    Kidney disease Father     Social History   Socioeconomic History   Marital status: Married    Spouse name: Not on file   Number of children: Not on file   Years of education: Not on file   Highest education level: Not on file  Occupational History   Occupation: retired  Tobacco Use   Smoking status: Former    Current packs/day: 0.00    Average packs/day: 1 pack/day for 40.0 years (40.0 ttl pk-yrs)    Types: Cigarettes    Start date: 56    Quit date: 1971    Years since quitting: 54.2    Passive exposure: Past   Smokeless tobacco: Never  Vaping Use   Vaping status: Never Used  Substance and Sexual Activity   Alcohol use: Yes    Alcohol/week: 7.0 standard drinks of alcohol    Types: 7 Glasses of wine per week   Drug use: Never   Sexual activity: Not on file  Other Topics Concern   Not on file  Social History Narrative   Not on file   Social Drivers of Health   Financial Resource Strain: Not on file  Food Insecurity: Not on file  Transportation Needs: Not on file  Physical Activity: Not on file  Stress: Not on file  Social Connections: Not on file  Intimate Partner Violence: Not on file    Review of Systems  Constitutional:  Negative for fatigue.  Respiratory:  Positive for apnea.   Psychiatric/Behavioral:  Positive for sleep  disturbance.     Vitals:   06/04/23 1444  Pulse: 64  Temp: 98.1 F (36.7 C)  SpO2: 97%    Physical Exam Constitutional:      Appearance: Normal appearance. He is well-developed.  HENT:     Head: Normocephalic.     Nose: Nose normal.     Mouth/Throat:     Mouth: Mucous membranes are moist.  Neck:     Thyroid: No thyromegaly.     Trachea: No tracheal deviation.  Cardiovascular:     Rate and Rhythm: Normal rate and regular rhythm.  Pulmonary:     Effort: Pulmonary effort is normal.  No respiratory distress.     Breath sounds: Normal breath sounds. No stridor. No wheezing, rhonchi or rales.  Musculoskeletal:     Cervical back: No rigidity or tenderness.  Neurological:     General: No focal deficit present.     Mental Status: He is alert.  Psychiatric:        Mood and Affect: Mood normal.   Data Reviewed: Sleep study from 2017 did reveal mild obstructive sleep apnea Titration study showed he was titrated to a pressure of 9 with residual AHI 1.4  Patient's download from his machine shows AHI is gone from 2.9 to about 5.9 in January compared with February Sleep quality still feels about the same Waking up feeling rested  Assessment:  Obstructive sleep apnea -Continue on pressure of 11  He continues to tolerate CPAP well  We will change pressure from 11-12  Will contact DME to make this change  Continues to feel relatively well  He does have pulmonary nodules Following up with Dr. Delton Coombes  Plan/Recommendations: DME referral for CPAP pressure changed to 12  Encouraged to continue to keep his logs  Will see him back in about 3 months  Virl Diamond MD [facility] 06/04/2023, 3:06 PM  CC: Paulina Fusi, MD

## 2023-06-28 DIAGNOSIS — G4733 Obstructive sleep apnea (adult) (pediatric): Secondary | ICD-10-CM | POA: Diagnosis not present

## 2023-07-02 DIAGNOSIS — D519 Vitamin B12 deficiency anemia, unspecified: Secondary | ICD-10-CM | POA: Diagnosis not present

## 2023-07-18 ENCOUNTER — Ambulatory Visit: Payer: Medicare Other | Admitting: Oncology

## 2023-07-18 ENCOUNTER — Other Ambulatory Visit: Payer: Medicare Other

## 2023-07-28 NOTE — Progress Notes (Unsigned)
 [facility]  [address] [place],  [place]  [address] [phone]  Clinic Day:  07/29/2023  Referring physician: Adrian Hopper, MD  HISTORY OF PRESENT ILLNESS:  The patient is a 89 y.o. male who I follow for mild leukopenia and anemia.  At his last visit, labs showed him to be iron deficient, for which he received IV iron in December 2024.  He comes in today to reassess his peripheral counts.  Since his last visit, the patient has been doing fairly well.  Although he has not noticed a significant difference in how he is felt since his IV iron was given, his daily quality of life remains fine.  He denies having any overt forms of blood loss.  He denies having any spontaneous infections as it pertains to his leukopenia.    PHYSICAL EXAM:  Blood pressure 129/65, pulse 62, temperature 97.7 F (36.5 C), temperature source Oral, resp. rate 18, height 5\' 11"  (1.803 m), weight 159 lb 1.6 oz (72.2 kg), SpO2 95%. Wt Readings from Last 3 Encounters:  07/29/23 159 lb 1.6 oz (72.2 kg)  06/04/23 162 lb 3.2 oz (73.6 kg)  05/28/23 164 lb 3.2 oz (74.5 kg)   Body mass index is 22.19 kg/m. Performance status (ECOG): 0 Physical Exam Constitutional:      Appearance: Normal appearance. He is not ill-appearing.  HENT:     Mouth/Throat:     Mouth: Mucous membranes are moist.     Pharynx: Oropharynx is clear. No oropharyngeal exudate or posterior oropharyngeal erythema.  Cardiovascular:     Rate and Rhythm: Normal rate and regular rhythm.     Heart sounds: No murmur heard.    No friction rub. No gallop.  Pulmonary:     Effort: Pulmonary effort is normal. No respiratory distress.     Breath sounds: Normal breath sounds. No wheezing, rhonchi or rales.  Abdominal:     General: Bowel sounds are normal. There is no distension.     Palpations: Abdomen is soft. There is no mass.     Tenderness: There is no abdominal tenderness.  Musculoskeletal:        General: No  swelling.     Right lower leg: No edema.     Left lower leg: No edema.  Lymphadenopathy:     Cervical: No cervical adenopathy.     Upper Body:     Right upper body: No supraclavicular or axillary adenopathy.     Left upper body: No supraclavicular or axillary adenopathy.     Lower Body: No right inguinal adenopathy. No left inguinal adenopathy.  Skin:    General: Skin is warm.     Coloration: Skin is not jaundiced.     Findings: No lesion or rash.  Neurological:     General: No focal deficit present.     Mental Status: He is alert and oriented to person, place, and time. Mental status is at baseline.  Psychiatric:        Mood and Affect: Mood normal.        Behavior: Behavior normal.        Thought Content: Thought content normal.   LABS:      Latest Ref Rng & Units 07/29/2023   10:43 AM 03/19/2023   10:29 AM 09/17/2022   12:00 AM  CBC  WBC 4.0 - 10.5 K/uL 6.5  5.3  5.6      Hemoglobin 13.0 - 17.0 g/dL 91.4  78.2  95.6  Hematocrit 39.0 - 52.0 % 35.7  32.7  34      Platelets 150 - 400 K/uL 106  115  117         This result is from an external source.      Latest Ref Rng & Units 07/29/2023   10:43 AM 09/17/2021   12:00 AM 09/12/2021   11:55 AM  CMP  Glucose 70 - 99 mg/dL 73   83   BUN 8 - 23 mg/dL 17  18     12    Creatinine 0.61 - 1.24 mg/dL 1.61  1.1     0.96   Sodium 135 - 145 mmol/L 138  137     136   Potassium 3.5 - 5.1 mmol/L 4.2  4.4     4.2   Chloride 98 - 111 mmol/L 102  103     99   CO2 22 - 32 mmol/L 26  25     23    Calcium 8.9 - 10.3 mg/dL 9.5  8.8     9.3   Total Protein 6.5 - 8.1 g/dL 6.5   6.4   Total Bilirubin 0.0 - 1.2 mg/dL 0.4   0.3   Alkaline Phos 38 - 126 U/L 85  51     61   AST 15 - 41 U/L 15  23     18    ALT 0 - 44 U/L 13  22     18       This result is from an external source.   Iron studies pending  ASSESSMENT & PLAN:  Assessment/Plan:  A 89 y.o. male with mild leukopenia and recent issues with with iron deficiency anemia.  When  evaluating his labs today, his white cell remains normal.  His thrombocytopenia remains stable.  His hemoglobin has improved since receiving his IV iron.  Furthermore his iron parameters show  Clinically, the patient is doing well.  I will see him back in 4 months for repeat clinical assessment.  The patient understands all the plans discussed today and is in agreement with them.    Suhaila Troiano Felicia Horde, MD

## 2023-07-29 ENCOUNTER — Inpatient Hospital Stay: Admitting: Oncology

## 2023-07-29 ENCOUNTER — Inpatient Hospital Stay: Attending: Oncology

## 2023-07-29 ENCOUNTER — Other Ambulatory Visit: Payer: Self-pay | Admitting: Oncology

## 2023-07-29 VITALS — BP 129/65 | HR 62 | Temp 97.7°F | Resp 18 | Ht 71.0 in | Wt 159.1 lb

## 2023-07-29 DIAGNOSIS — D696 Thrombocytopenia, unspecified: Secondary | ICD-10-CM | POA: Diagnosis not present

## 2023-07-29 DIAGNOSIS — D649 Anemia, unspecified: Secondary | ICD-10-CM | POA: Diagnosis not present

## 2023-07-29 DIAGNOSIS — D708 Other neutropenia: Secondary | ICD-10-CM | POA: Diagnosis not present

## 2023-07-29 DIAGNOSIS — D72819 Decreased white blood cell count, unspecified: Secondary | ICD-10-CM | POA: Insufficient documentation

## 2023-07-29 DIAGNOSIS — D508 Other iron deficiency anemias: Secondary | ICD-10-CM

## 2023-07-29 DIAGNOSIS — D539 Nutritional anemia, unspecified: Secondary | ICD-10-CM

## 2023-07-29 LAB — CBC WITH DIFFERENTIAL (CANCER CENTER ONLY)
Abs Immature Granulocytes: 0.09 10*3/uL — ABNORMAL HIGH (ref 0.00–0.07)
Basophils Absolute: 0 10*3/uL (ref 0.0–0.1)
Basophils Relative: 1 %
Eosinophils Absolute: 0 10*3/uL (ref 0.0–0.5)
Eosinophils Relative: 0 %
HCT: 35.7 % — ABNORMAL LOW (ref 39.0–52.0)
Hemoglobin: 12.1 g/dL — ABNORMAL LOW (ref 13.0–17.0)
Immature Granulocytes: 1 %
Immature Platelet Fraction: 2.3 % (ref 1.2–8.6)
Lymphocytes Relative: 16 %
Lymphs Abs: 1.1 10*3/uL (ref 0.7–4.0)
MCH: 32.3 pg (ref 26.0–34.0)
MCHC: 33.9 g/dL (ref 30.0–36.0)
MCV: 95.2 fL (ref 80.0–100.0)
Monocytes Absolute: 1.6 10*3/uL — ABNORMAL HIGH (ref 0.1–1.0)
Monocytes Relative: 25 %
Neutro Abs: 3.7 10*3/uL (ref 1.7–7.7)
Neutrophils Relative %: 57 %
Platelet Count: 106 10*3/uL — ABNORMAL LOW (ref 150–400)
RBC: 3.75 MIL/uL — ABNORMAL LOW (ref 4.22–5.81)
RDW: 13.4 % (ref 11.5–15.5)
WBC Count: 6.5 10*3/uL (ref 4.0–10.5)
nRBC: 0 % (ref 0.0–0.2)
nRBC: 0 /100{WBCs}

## 2023-07-29 LAB — IRON AND TIBC
Iron: 71 ug/dL (ref 45–182)
Saturation Ratios: 30 % (ref 17.9–39.5)
TIBC: 234 ug/dL — ABNORMAL LOW (ref 250–450)
UIBC: 163 ug/dL

## 2023-07-29 LAB — CMP (CANCER CENTER ONLY)
ALT: 13 U/L (ref 0–44)
AST: 15 U/L (ref 15–41)
Albumin: 4.3 g/dL (ref 3.5–5.0)
Alkaline Phosphatase: 85 U/L (ref 38–126)
Anion gap: 10 (ref 5–15)
BUN: 17 mg/dL (ref 8–23)
CO2: 26 mmol/L (ref 22–32)
Calcium: 9.5 mg/dL (ref 8.9–10.3)
Chloride: 102 mmol/L (ref 98–111)
Creatinine: 1.18 mg/dL (ref 0.61–1.24)
GFR, Estimated: 56 mL/min — ABNORMAL LOW (ref >60)
Glucose, Bld: 73 mg/dL (ref 70–99)
Potassium: 4.2 mmol/L (ref 3.5–5.1)
Sodium: 138 mmol/L (ref 135–145)
Total Bilirubin: 0.4 mg/dL (ref 0.0–1.2)
Total Protein: 6.5 g/dL (ref 6.5–8.1)

## 2023-07-29 LAB — FOLATE: Folate: 5.6 ng/mL — ABNORMAL LOW (ref >5.9)

## 2023-07-29 LAB — FERRITIN: Ferritin: 138 ng/mL (ref 24–336)

## 2023-07-29 LAB — VITAMIN B12: Vitamin B-12: 578 pg/mL (ref 180–914)

## 2023-07-30 ENCOUNTER — Telehealth: Payer: Self-pay

## 2023-07-30 ENCOUNTER — Encounter: Payer: Self-pay | Admitting: Oncology

## 2023-07-30 NOTE — Telephone Encounter (Signed)
 LM FOR PATIENT TO [facility].  FOLATE LEVELS ARE MILDLY OW.  DR. Harles Lied WANTS PATIENT TO START FOLATE 800 MCG DAILY UNTIL NEXT VISIT.

## 2023-08-06 ENCOUNTER — Telehealth (HOSPITAL_BASED_OUTPATIENT_CLINIC_OR_DEPARTMENT_OTHER): Payer: Self-pay

## 2023-08-06 DIAGNOSIS — I10 Essential (primary) hypertension: Secondary | ICD-10-CM | POA: Diagnosis not present

## 2023-08-06 DIAGNOSIS — I69351 Hemiplegia and hemiparesis following cerebral infarction affecting right dominant side: Secondary | ICD-10-CM | POA: Diagnosis not present

## 2023-08-06 DIAGNOSIS — E039 Hypothyroidism, unspecified: Secondary | ICD-10-CM | POA: Diagnosis not present

## 2023-08-06 DIAGNOSIS — D519 Vitamin B12 deficiency anemia, unspecified: Secondary | ICD-10-CM | POA: Diagnosis not present

## 2023-08-06 DIAGNOSIS — E785 Hyperlipidemia, unspecified: Secondary | ICD-10-CM | POA: Diagnosis not present

## 2023-08-06 DIAGNOSIS — E041 Nontoxic single thyroid nodule: Secondary | ICD-10-CM | POA: Diagnosis not present

## 2023-08-06 DIAGNOSIS — I471 Supraventricular tachycardia, unspecified: Secondary | ICD-10-CM | POA: Diagnosis not present

## 2023-08-06 DIAGNOSIS — D638 Anemia in other chronic diseases classified elsewhere: Secondary | ICD-10-CM | POA: Diagnosis not present

## 2023-08-06 DIAGNOSIS — G4733 Obstructive sleep apnea (adult) (pediatric): Secondary | ICD-10-CM | POA: Diagnosis not present

## 2023-08-06 DIAGNOSIS — R600 Localized edema: Secondary | ICD-10-CM | POA: Diagnosis not present

## 2023-08-06 NOTE — Telephone Encounter (Addendum)
 Referral has been sent. Nothing Further needed.

## 2023-08-06 NOTE — Telephone Encounter (Signed)
 Copied from CRM [id number]. Topic: Clinical - Request for Lab/Test Order >> Aug 05, 2023 12:18 PM Margarette Shawl wrote: Reason for CRM:   Lauren, with [facility] is calling to request the order for PET scan be sent to them. She is not able to see it in their system.   FX# [phone]

## 2023-09-04 ENCOUNTER — Encounter: Payer: Self-pay | Admitting: Pulmonary Disease

## 2023-09-04 ENCOUNTER — Ambulatory Visit: Admitting: Pulmonary Disease

## 2023-09-04 VITALS — BP 115/62 | HR 60 | Ht 71.5 in | Wt 158.0 lb

## 2023-09-04 DIAGNOSIS — Z9981 Dependence on supplemental oxygen: Secondary | ICD-10-CM

## 2023-09-04 DIAGNOSIS — Z87891 Personal history of nicotine dependence: Secondary | ICD-10-CM | POA: Diagnosis not present

## 2023-09-04 DIAGNOSIS — G4733 Obstructive sleep apnea (adult) (pediatric): Secondary | ICD-10-CM

## 2023-09-04 NOTE — Patient Instructions (Signed)
 I will see you back in 6 months  Call us  with significant concerns  Continue to use your CPAP on a regular basis

## 2023-09-04 NOTE — Progress Notes (Signed)
 [address] Jimmy Grant    [id number]    Mar 20, 1925  Primary Care Physician:Schultz, Garlan Juniper, MD  Referring Physician: Adrian Hopper, MD [address] [place],  [place] [address]  Chief complaint:   History of obstructive sleep apnea Continues to use CPAP on a nightly basis  HPI:  Patient with obstructive sleep apnea diagnosed in 2017 on a CPAP of 11  It was increased to CPAP of 12 at the last visit  He continues to do well AHI fluctuations continues  Wakes up in the morning feeling like he is at a good nights rest Feels rested Energy levels are good  More sedentary lifestyle but he does try to exercise  He has no specific complaints  Continues to tolerate CPAP well  His heart has been relatively stable, follows up with cardiology  States he never sleeps on his back for any length of time known to him Usually goes to bed on his left side, after couple of hours, wakes up and turns to his right side He gets up finally about 5 AM Recently upgraded his machine  Clinically does not feel any different Continues to feel well generally  He was diagnosed with mild obstructive sleep apnea titrated to a pressure of 9  He feels his sleep is restorative He does not wake up to use the bathroom He does have musculoskeletal pains and discomfort but does not feel this is contributing to his awakenings  Continues to keep a good log about his symptoms and CPAP use  Currently uses a full facemask  Follows up with Dr. Byrum for lung nodules, has an appointment coming up soon  Outpatient Encounter Medications as of 09/04/2023  Medication Sig   amiodarone  (PACERONE ) 200 MG tablet Take 1 tablet (200 mg total) by mouth daily.   aspirin  EC 81 MG tablet Take 81 mg by mouth daily.   Azelastine  HCl 0.15 % SOLN Place 2 sprays into both nostrils daily.   calcium-vitamin D  (OSCAL WITH D) 500-200 MG-UNIT tablet Take 1 tablet by mouth daily.   DIOVAN  160  MG tablet Take 160 mg by mouth 2 (two) times daily.    dorzolamide -timolol  (COSOPT ) 22.3-6.8 MG/ML ophthalmic solution Place 1 drop into both eyes 2 (two) times daily.   folic acid  (FOLVITE ) 800 MCG tablet Take 800 mcg by mouth daily.   Glucosamine-Chondroitin (GLUCOSAMINE CHONDR COMPLEX PO) Take 1 tablet by mouth daily.   latanoprost (XALATAN) 0.005 % ophthalmic solution Place 1 drop into both eyes at bedtime.   levothyroxine  (SYNTHROID ) 125 MCG tablet Take 125 mcg by mouth daily.   Magnesium  200 MG TABS Take 1 tablet (200 mg total) by mouth daily.   Multiple Vitamins-Minerals (PRESERVISION AREDS 2+MULTI VIT) CAPS Take 1 capsule by mouth 2 (two) times daily.   simvastatin  (ZOCOR ) 20 MG tablet Take 20 mg by mouth every evening.   No facility-administered encounter medications on file as of 09/04/2023.    Allergies as of 09/04/2023   (No Known Allergies)    Past Medical History:  Diagnosis Date   'Light-for-dates' infant with signs of fetal malnutrition 08/19/2016   Acute respiratory disease due to COVID-19 virus 04/07/2019   Anemia 10/17/2017   Branch retinal vein occlusion    Cardiac arrhythmia 07/23/2016   Choroidal nevus, right 11/13/2017   Dyslipidemia    Edema of both legs    Frequent PVCs 09/25/2020   Glaucoma    History of prostate cancer  Hypercholesterolemia 06/08/2018   Hypertension 07/23/2016   Intermediate stage nonexudative age-related macular degeneration of both eyes 02/01/2021   Iritis, recurrent, bilateral 12/09/2013   Late effects of CVA (cerebrovascular accident)    Leukopenia 10/17/2017   Lumbar disc narrowing    Macular edema 07/16/2012   Malignant neoplasm of prostate (HCC) 03/17/2020   Mixed hyperlipidemia 06/02/2020   OSA on CPAP    Osteoarthritis of right glenohumeral joint    PAC (premature atrial contraction) 06/02/2020   Primary hypertension 06/02/2020   Primary open angle glaucoma (POAG) of both eyes, moderate stage 07/16/2012   Prostate  cancer (HCC)    PSVT (paroxysmal supraventricular tachycardia) (HCC) 08/12/2005   PVC (premature ventricular contraction) 06/02/2020   Status post intraocular lens implant 07/16/2012   Thyroid  nodule    Vitamin B12 deficiency     Past Surgical History:  Procedure Laterality Date   ADENOIDECTOMY  1931   CATARACT EXTRACTION Bilateral    Left on 05/04/96, Right on 05/08/1993   HEMORROIDECTOMY  11/21/2014   Done by Keenan Pastor   PROSTATECTOMY  01/11/1999   Transurethral   TEAR DUCT PROBING Right 07/2018   Tear duct surgery / Dr. Zaldivar   VIDEO ASSISTED THORACOSCOPY (VATS)/DECORTICATION  2012    Family History  Problem Relation Age of Onset   Diabetes Mother    Hypertension Mother    Kidney disease Father     Social History   Socioeconomic History   Marital status: Married    Spouse name: Not on file   Number of children: Not on file   Years of education: Not on file   Highest education level: Not on file  Occupational History   Occupation: retired  Tobacco Use   Smoking status: Former    Current packs/day: 0.00    Average packs/day: 1 pack/day for 40.0 years (40.0 ttl pk-yrs)    Types: Cigarettes    Start date: 17    Quit date: 1971    Years since quitting: 54.4    Passive exposure: Past   Smokeless tobacco: Never  Vaping Use   Vaping status: Never Used  Substance and Sexual Activity   Alcohol use: Yes    Alcohol/week: 7.0 standard drinks of alcohol    Types: 7 Glasses of wine per week   Drug use: Never   Sexual activity: Not on file  Other Topics Concern   Not on file  Social History Narrative   Not on file   Social Drivers of Health   Financial Resource Strain: Not on file  Food Insecurity: Not on file  Transportation Needs: Not on file  Physical Activity: Not on file  Stress: Not on file  Social Connections: Not on file  Intimate Partner Violence: Not on file    Review of Systems  Constitutional:  Negative for fatigue.  Respiratory:   Positive for apnea.   Psychiatric/Behavioral:  Positive for sleep disturbance.     Vitals:   09/04/23 1045  BP: 115/62  Pulse: 60  SpO2: 96%    Physical Exam Constitutional:      Appearance: Normal appearance. He is well-developed.  HENT:     Head: Normocephalic.     Nose: Nose normal.     Mouth/Throat:     Mouth: Mucous membranes are moist.  Neck:     Thyroid : No thyromegaly.     Trachea: No tracheal deviation.  Cardiovascular:     Rate and Rhythm: Normal rate and regular rhythm.  Pulmonary:  Effort: Pulmonary effort is normal. No respiratory distress.     Breath sounds: Normal breath sounds. No stridor. No wheezing, rhonchi or rales.  Musculoskeletal:     Cervical back: No rigidity or tenderness.  Neurological:     General: No focal deficit present.     Mental Status: He is alert.  Psychiatric:        Mood and Affect: Mood normal.    Data Reviewed: Sleep study from 2017 did reveal mild obstructive sleep apnea Titration study showed he was titrated to a pressure of 9 with residual AHI 1.4  Download from his documentation reviewed showing significant variability but overall AHI still remains low  Assessment:  Obstructive sleep apnea - Continues use CPAP on a regular basis - Tolerating machine well - Waking up feeling rested  Tolerating CPAP well  Continues to feel relatively well  He has pulmonary nodules - Following up with Dr. Baldwin Levee   Plan/Recommendations: Continue CPAP on a nightly  Call us  with significant concerns  He continues to keep a regular log about his AHI which does fluctuate a little bit  Follow-up in 6 months  Myer Artis MD [facility] 09/04/2023, 10:49 AM  CC: Adrian Hopper, MD

## 2023-09-10 DIAGNOSIS — D519 Vitamin B12 deficiency anemia, unspecified: Secondary | ICD-10-CM | POA: Diagnosis not present

## 2023-09-15 DIAGNOSIS — H401133 Primary open-angle glaucoma, bilateral, severe stage: Secondary | ICD-10-CM | POA: Diagnosis not present

## 2023-09-15 DIAGNOSIS — D3131 Benign neoplasm of right choroid: Secondary | ICD-10-CM | POA: Diagnosis not present

## 2023-09-15 DIAGNOSIS — H353131 Nonexudative age-related macular degeneration, bilateral, early dry stage: Secondary | ICD-10-CM | POA: Diagnosis not present

## 2023-09-15 DIAGNOSIS — Z961 Presence of intraocular lens: Secondary | ICD-10-CM | POA: Diagnosis not present

## 2023-10-15 DIAGNOSIS — D519 Vitamin B12 deficiency anemia, unspecified: Secondary | ICD-10-CM | POA: Diagnosis not present

## 2023-11-11 DIAGNOSIS — G5601 Carpal tunnel syndrome, right upper limb: Secondary | ICD-10-CM | POA: Diagnosis not present

## 2023-11-18 DIAGNOSIS — J439 Emphysema, unspecified: Secondary | ICD-10-CM | POA: Diagnosis not present

## 2023-11-18 DIAGNOSIS — R918 Other nonspecific abnormal finding of lung field: Secondary | ICD-10-CM | POA: Diagnosis not present

## 2023-11-20 DIAGNOSIS — R600 Localized edema: Secondary | ICD-10-CM | POA: Diagnosis not present

## 2023-11-20 DIAGNOSIS — I10 Essential (primary) hypertension: Secondary | ICD-10-CM | POA: Diagnosis not present

## 2023-11-20 DIAGNOSIS — D638 Anemia in other chronic diseases classified elsewhere: Secondary | ICD-10-CM | POA: Diagnosis not present

## 2023-11-20 DIAGNOSIS — E039 Hypothyroidism, unspecified: Secondary | ICD-10-CM | POA: Diagnosis not present

## 2023-11-20 DIAGNOSIS — E785 Hyperlipidemia, unspecified: Secondary | ICD-10-CM | POA: Diagnosis not present

## 2023-11-20 DIAGNOSIS — I471 Supraventricular tachycardia, unspecified: Secondary | ICD-10-CM | POA: Diagnosis not present

## 2023-11-20 DIAGNOSIS — D529 Folate deficiency anemia, unspecified: Secondary | ICD-10-CM | POA: Diagnosis not present

## 2023-11-20 DIAGNOSIS — D519 Vitamin B12 deficiency anemia, unspecified: Secondary | ICD-10-CM | POA: Diagnosis not present

## 2023-11-20 DIAGNOSIS — M65351 Trigger finger, right little finger: Secondary | ICD-10-CM | POA: Diagnosis not present

## 2023-11-20 DIAGNOSIS — R911 Solitary pulmonary nodule: Secondary | ICD-10-CM | POA: Diagnosis not present

## 2023-11-20 DIAGNOSIS — I69351 Hemiplegia and hemiparesis following cerebral infarction affecting right dominant side: Secondary | ICD-10-CM | POA: Diagnosis not present

## 2023-11-21 LAB — LAB REPORT - SCANNED: EGFR: 57

## 2023-11-26 ENCOUNTER — Encounter: Payer: Self-pay | Admitting: Emergency Medicine

## 2023-11-26 ENCOUNTER — Ambulatory Visit: Admitting: Emergency Medicine

## 2023-11-26 VITALS — BP 122/68 | HR 70 | Ht 71.0 in | Wt 157.0 lb

## 2023-11-26 DIAGNOSIS — J432 Centrilobular emphysema: Secondary | ICD-10-CM

## 2023-11-26 DIAGNOSIS — R918 Other nonspecific abnormal finding of lung field: Secondary | ICD-10-CM | POA: Diagnosis not present

## 2023-11-26 NOTE — Assessment & Plan Note (Signed)
 Emphysematous change in a patient with history of remote smoking.  In absence of any limitation, good functional capacity, can hold off on PFT or BD therapy.  Could reconsider empiric trial of albuterol  if symptoms evolve.

## 2023-11-26 NOTE — Progress Notes (Signed)
 Subjective:    Patient ID: Jimmy Grant., male    DOB: 1924/10/08, 89 y.o.   MRN: [id number]  HPI  ROV 05/28/2023 --follow-up visit for 89 year old gentleman with history of tobacco use, prostate cancer, OSA on CPAP, leukopenia.  I saw him 1 month ago in January for an abnormal CT scan of the chest that showed emphysema, 2 abutting right lower lobe nodules. We arranged for PET scan to further evaluate. He reports today that he is feeling well. He feels some pain over in the R flank, almost rib pain  PET scan performed 05/21/2023 reviewed by me shows partial (almost complete) resolution of the 2 adjacent right lower lobe nodules with some associated hypermetabolism in these areas consistent with resolving inflammation or infection.  This is inconsistent with malignancy. There is some intracostal hypermetabolism of ? Significance   ROV 11/26/2023 --Jimmy Grant is 16 with history of former tobacco use, prostate cancer, OSA on CPAP, leukopenia.  I have seen him before but area pulmonary nodularity on chest imaging in the right lower lobe.  This prompted a PET scan that was done in February that showed improvement.  His CT chest was repeated. He is not experiencing any respiratory symptoms currently. No cough, no mucous or hemoptysis. No flank or rib pain currently.   CT scan of the chest done on 11/18/2023 reviewed by me showed moderate emphysematous change, some scarring and bronchiectatic change in the right lower lobe without any pulmonary nodules or masses.  The previously defined nodular areas have resolved  Review of Systems As per HPI  Past Medical History:  Diagnosis Date   'Light-for-dates' infant with signs of fetal malnutrition 08/19/2016   Acute respiratory disease due to COVID-19 virus 04/07/2019   Anemia 10/17/2017   Branch retinal vein occlusion    Cardiac arrhythmia 07/23/2016   Choroidal nevus, right 11/13/2017   Dyslipidemia    Edema of both legs    Frequent PVCs  09/25/2020   Glaucoma    History of prostate cancer    Hypercholesterolemia 06/08/2018   Hypertension 07/23/2016   Intermediate stage nonexudative age-related macular degeneration of both eyes 02/01/2021   Iritis, recurrent, bilateral 12/09/2013   Late effects of CVA (cerebrovascular accident)    Leukopenia 10/17/2017   Lumbar disc narrowing    Macular edema 07/16/2012   Malignant neoplasm of prostate (HCC) 03/17/2020   Mixed hyperlipidemia 06/02/2020   OSA on CPAP    Osteoarthritis of right glenohumeral joint    PAC (premature atrial contraction) 06/02/2020   Primary hypertension 06/02/2020   Primary open angle glaucoma (POAG) of both eyes, moderate stage 07/16/2012   Prostate cancer (HCC)    PSVT (paroxysmal supraventricular tachycardia) (HCC) 08/12/2005   PVC (premature ventricular contraction) 06/02/2020   Status post intraocular lens implant 07/16/2012   Thyroid  nodule    Vitamin B12 deficiency      Family History  Problem Relation Age of Onset   Diabetes Mother    Hypertension Mother    Kidney disease Father      Social History   Socioeconomic History   Marital status: Married    Spouse name: Not on file   Number of children: Not on file   Years of education: Not on file   Highest education level: Not on file  Occupational History   Occupation: retired  Tobacco Use   Smoking status: Former    Current packs/day: 0.00    Average packs/day: 1 pack/day for 40.0 years (40.0 ttl pk-yrs)  Types: Cigarettes    Start date: 29    Quit date: 1971    Years since quitting: 54.6    Passive exposure: Past   Smokeless tobacco: Never  Vaping Use   Vaping status: Never Used  Substance and Sexual Activity   Alcohol use: Yes    Alcohol/week: 7.0 standard drinks of alcohol    Types: 7 Glasses of wine per week   Drug use: Never   Sexual activity: Not on file  Other Topics Concern   Not on file  Social History Narrative   Not on file   Social Drivers of Health    Financial Resource Strain: Not on file  Food Insecurity: Not on file  Transportation Needs: Not on file  Physical Activity: Not on file  Stress: Not on file  Social Connections: Not on file  Intimate Partner Violence: Not on file    - The patient is a former smoker (40 pack years) - Worked in Customer service manager as a hobby  No Known Allergies   Outpatient Medications Prior to Visit  Medication Sig Dispense Refill   amiodarone  (PACERONE ) 200 MG tablet Take 1 tablet (200 mg total) by mouth daily. 90 tablet 3   aspirin  EC 81 MG tablet Take 81 mg by mouth daily.     Azelastine  HCl 0.15 % SOLN Place 2 sprays into both nostrils daily.     calcium-vitamin D  (OSCAL WITH D) 500-200 MG-UNIT tablet Take 1 tablet by mouth daily.     DIOVAN  160 MG tablet Take 160 mg by mouth 2 (two) times daily.      dorzolamide -timolol  (COSOPT ) 22.3-6.8 MG/ML ophthalmic solution Place 1 drop into both eyes 2 (two) times daily.     folic acid  (FOLVITE ) 800 MCG tablet Take 800 mcg by mouth daily.     Glucosamine-Chondroitin (GLUCOSAMINE CHONDR COMPLEX PO) Take 1 tablet by mouth daily.     latanoprost (XALATAN) 0.005 % ophthalmic solution Place 1 drop into both eyes at bedtime.     levothyroxine  (SYNTHROID ) 125 MCG tablet Take 125 mcg by mouth daily.     Magnesium  200 MG TABS Take 1 tablet (200 mg total) by mouth daily. 90 tablet 3   Multiple Vitamins-Minerals (PRESERVISION AREDS 2+MULTI VIT) CAPS Take 1 capsule by mouth 2 (two) times daily.     simvastatin  (ZOCOR ) 20 MG tablet Take 20 mg by mouth every evening.     No facility-administered medications prior to visit.        Objective:   Physical Exam Vitals:   11/26/23 0944  BP: 122/68  Pulse: 70  SpO2: 96%  Weight: 157 lb (71.2 kg)  Height: 5' 11 (1.803 m)    Gen: Pleasant, thin elderly gentleman, in no distress,  normal affect  ENT: No lesions,  mouth clear,  oropharynx clear, no postnasal drip  Neck: No JVD, no stridor  Lungs: No use  of accessory muscles, no crackles or wheezing on normal respiration, no wheeze on forced expiration  Cardiovascular: RRR, heart sounds normal, no murmur or gallops, bilateral ankle edema left greater than right  Musculoskeletal: No deformities, no cyanosis or clubbing.  No tenderness in the right costal margin on palpation  Neuro: alert, awake, non focal  Skin: Warm, no lesions or rash       Assessment & Plan:  Pulmonary nodules His CT chest shows continued, right lower lobe nodularity has now fully resolved with some slight residual scar.  We should not need to repeat unless he has  new respiratory symptoms.  He will notify us  if he has recurrence of cough, sputum production, hemoptysis.  Centrilobular emphysema (HCC) Emphysematous change in a patient with history of remote smoking.  In absence of any limitation, good functional capacity, can hold off on PFT or BD therapy.  Could reconsider empiric trial of albuterol  if symptoms evolve.     Jimmy Chris, MD, PhD 11/26/2023, 10:16 AM [facility] [phone] or if no answer before 7:00PM call [phone] For any issues after 7:00PM please call eLink [phone]

## 2023-11-26 NOTE — Progress Notes (Unsigned)
 [facility] at [facility] [address] [place],  [place]  [address] [phone]  Clinic Day:  11/27/2023  Referring physician: Keren Vicenta BRAVO, MD  HISTORY OF PRESENT ILLNESS:  The patient is a 89 y.o. male who I follow for mild leukopenia and anemia.  He has been iron deficient, for which he received IV iron in December 2024.  He was also found to be folate deficient for which he has been taking folic acid  daily.  He comes in today to reassess his peripheral counts.  Since his last visit, the patient has been doing fairly well.  His daily quality of life remains fine.  He denies having any overt forms of blood loss.  He denies having any spontaneous infections as it pertains to his leukopenia.    PHYSICAL EXAM:  Blood pressure (!) 142/62, pulse 62, temperature 98.1 F (36.7 C), temperature source Oral, resp. rate 20, height 5' 11.5 (1.816 m), weight 155 lb (70.3 kg), SpO2 98%. Wt Readings from Last 3 Encounters:  11/27/23 155 lb (70.3 kg)  11/26/23 157 lb (71.2 kg)  09/04/23 158 lb (71.7 kg)   Body mass index is 21.32 kg/m. Performance status (ECOG): 0 Physical Exam Constitutional:      Appearance: Normal appearance. He is not ill-appearing.  HENT:     Mouth/Throat:     Mouth: Mucous membranes are moist.     Pharynx: Oropharynx is clear. No oropharyngeal exudate or posterior oropharyngeal erythema.  Cardiovascular:     Rate and Rhythm: Normal rate and regular rhythm.     Heart sounds: No murmur heard.    No friction rub. No gallop.  Pulmonary:     Effort: Pulmonary effort is normal. No respiratory distress.     Breath sounds: Normal breath sounds. No wheezing, rhonchi or rales.  Abdominal:     General: Bowel sounds are normal. There is no distension.     Palpations: Abdomen is soft. There is no mass.     Tenderness: There is no abdominal tenderness.  Musculoskeletal:        General: No swelling.     Right lower leg: No edema.     Left lower leg:  No edema.  Lymphadenopathy:     Cervical: No cervical adenopathy.     Upper Body:     Right upper body: No supraclavicular or axillary adenopathy.     Left upper body: No supraclavicular or axillary adenopathy.     Lower Body: No right inguinal adenopathy. No left inguinal adenopathy.  Skin:    General: Skin is warm.     Coloration: Skin is not jaundiced.     Findings: No lesion or rash.  Neurological:     General: No focal deficit present.     Mental Status: He is alert and oriented to person, place, and time. Mental status is at baseline.  Psychiatric:        Mood and Affect: Mood normal.        Behavior: Behavior normal.        Thought Content: Thought content normal.    LABS:      Latest Ref Rng & Units 11/27/2023   10:09 AM 07/29/2023   10:43 AM 03/19/2023   10:29 AM  CBC  WBC 4.0 - 10.5 K/uL 5.8  6.5  5.3   Hemoglobin 13.0 - 17.0 g/dL 88.3  87.8  89.0   Hematocrit 39.0 - 52.0 % 35.6  35.7  32.7   Platelets 150 -  400 K/uL 116  106  115       Latest Ref Rng & Units 11/27/2023   10:09 AM 07/29/2023   10:43 AM 09/17/2021   12:00 AM  CMP  Glucose 70 - 99 mg/dL 98  73    BUN 8 - 23 mg/dL 16  17  18       Creatinine 0.61 - 1.24 mg/dL 8.80  8.81  1.1      Sodium 135 - 145 mmol/L 137  138  137      Potassium 3.5 - 5.1 mmol/L 4.1  4.2  4.4      Chloride 98 - 111 mmol/L 101  102  103      CO2 22 - 32 mmol/L 24  26  25       Calcium 8.9 - 10.3 mg/dL 9.6  9.5  8.8      Total Protein 6.5 - 8.1 g/dL 7.0  6.5    Total Bilirubin 0.0 - 1.2 mg/dL 0.4  0.4    Alkaline Phos 38 - 126 U/L 107  85  51      AST 15 - 41 U/L 12  15  23       ALT 0 - 44 U/L 11  13  22          This result is from an external source.    Latest Reference Range & Units 11/27/23 10:09  Iron 45 - 182 ug/dL 76  UIBC ug/dL 845  TIBC 749 - 549 ug/dL 769 (L)  Saturation Ratios 17.9 - 39.5 % 33  Ferritin 24 - 336 ng/mL 156  Folate >5.9 ng/mL >20.0  Vitamin B12 180 - 914 pg/mL 1,472 (H)  (L): Data is abnormally  low (H): Data is abnormally high  ASSESSMENT & PLAN:  Assessment/Plan:  A 89 y.o. male with mild leukopenia and recent issues with with iron deficiency anemia.  When evaluating his labs today, his white cell remains normal.  His thrombocytopenia remains stable.  His hemoglobin is slightly lower, but his iron parameters remain fine.  He no longer has any nutritional deficiencies.  Clinically, the patient is doing well.  Based upon this, I will see him back in 6 months for repeat clinical assessment.  The patient understands all the plans discussed today and is in agreement with them.    Valbona Slabach DELENA Kerns, MD

## 2023-11-26 NOTE — Assessment & Plan Note (Signed)
 His CT chest shows continued, right lower lobe nodularity has now fully resolved with some slight residual scar.  We should not need to repeat unless he has new respiratory symptoms.  He will notify us  if he has recurrence of cough, sputum production, hemoptysis.

## 2023-11-26 NOTE — Patient Instructions (Signed)
 We reviewed your CT scan of the chest today.  The scan shows resolution of previously identified nodularity in the right lower lobe.  There may be some slight residual scar but overall your scan has normalized.  This is good news. We do not need to plan a repeat CT unless you develop new symptoms. Please follow Dr. Elia Nunley for any new respiratory issues including shortness of breath, cough, mucus production.

## 2023-11-27 ENCOUNTER — Inpatient Hospital Stay: Attending: Oncology | Admitting: Oncology

## 2023-11-27 ENCOUNTER — Other Ambulatory Visit: Payer: Self-pay

## 2023-11-27 ENCOUNTER — Inpatient Hospital Stay

## 2023-11-27 ENCOUNTER — Other Ambulatory Visit: Payer: Self-pay | Admitting: Oncology

## 2023-11-27 VITALS — BP 142/62 | HR 62 | Temp 98.1°F | Resp 20 | Ht 71.5 in | Wt 155.0 lb

## 2023-11-27 DIAGNOSIS — D509 Iron deficiency anemia, unspecified: Secondary | ICD-10-CM | POA: Insufficient documentation

## 2023-11-27 DIAGNOSIS — D72819 Decreased white blood cell count, unspecified: Secondary | ICD-10-CM | POA: Diagnosis not present

## 2023-11-27 DIAGNOSIS — D696 Thrombocytopenia, unspecified: Secondary | ICD-10-CM | POA: Insufficient documentation

## 2023-11-27 DIAGNOSIS — D539 Nutritional anemia, unspecified: Secondary | ICD-10-CM

## 2023-11-27 DIAGNOSIS — D708 Other neutropenia: Secondary | ICD-10-CM | POA: Diagnosis not present

## 2023-11-27 DIAGNOSIS — Z79899 Other long term (current) drug therapy: Secondary | ICD-10-CM | POA: Diagnosis not present

## 2023-11-27 LAB — CBC WITH DIFFERENTIAL (CANCER CENTER ONLY)
Abs Immature Granulocytes: 0.06 K/uL (ref 0.00–0.07)
Basophils Absolute: 0 K/uL (ref 0.0–0.1)
Basophils Relative: 1 %
Eosinophils Absolute: 0 K/uL (ref 0.0–0.5)
Eosinophils Relative: 1 %
HCT: 35.6 % — ABNORMAL LOW (ref 39.0–52.0)
Hemoglobin: 11.6 g/dL — ABNORMAL LOW (ref 13.0–17.0)
Immature Granulocytes: 1 %
Lymphocytes Relative: 16 %
Lymphs Abs: 1 K/uL (ref 0.7–4.0)
MCH: 31.3 pg (ref 26.0–34.0)
MCHC: 32.6 g/dL (ref 30.0–36.0)
MCV: 96 fL (ref 80.0–100.0)
Monocytes Absolute: 1.4 K/uL — ABNORMAL HIGH (ref 0.1–1.0)
Monocytes Relative: 24 %
Neutro Abs: 3.4 K/uL (ref 1.7–7.7)
Neutrophils Relative %: 57 %
Platelet Count: 116 K/uL — ABNORMAL LOW (ref 150–400)
RBC: 3.71 MIL/uL — ABNORMAL LOW (ref 4.22–5.81)
RDW: 13.9 % (ref 11.5–15.5)
WBC Count: 5.8 K/uL (ref 4.0–10.5)
nRBC: 0 % (ref 0.0–0.2)

## 2023-11-27 LAB — CMP (CANCER CENTER ONLY)
ALT: 11 U/L (ref 0–44)
AST: 12 U/L — ABNORMAL LOW (ref 15–41)
Albumin: 4.5 g/dL (ref 3.5–5.0)
Alkaline Phosphatase: 107 U/L (ref 38–126)
Anion gap: 11 (ref 5–15)
BUN: 16 mg/dL (ref 8–23)
CO2: 24 mmol/L (ref 22–32)
Calcium: 9.6 mg/dL (ref 8.9–10.3)
Chloride: 101 mmol/L (ref 98–111)
Creatinine: 1.19 mg/dL (ref 0.61–1.24)
GFR, Estimated: 56 mL/min — ABNORMAL LOW (ref >60)
Glucose, Bld: 98 mg/dL (ref 70–99)
Potassium: 4.1 mmol/L (ref 3.5–5.1)
Sodium: 137 mmol/L (ref 135–145)
Total Bilirubin: 0.4 mg/dL (ref 0.0–1.2)
Total Protein: 7 g/dL (ref 6.5–8.1)

## 2023-11-27 LAB — FOLATE: Folate: >20 ng/mL (ref >5.9)

## 2023-11-27 LAB — IRON AND TIBC
Iron: 76 ug/dL (ref 45–182)
Saturation Ratios: 33 % (ref 17.9–39.5)
TIBC: 230 ug/dL — ABNORMAL LOW (ref 250–450)
UIBC: 154 ug/dL

## 2023-11-27 LAB — VITAMIN B12: Vitamin B-12: 1472 pg/mL — ABNORMAL HIGH (ref 180–914)

## 2023-11-27 LAB — FERRITIN: Ferritin: 156 ng/mL (ref 24–336)

## 2023-11-29 NOTE — Progress Notes (Unsigned)
 Cardiology Office Note:    Date:  12/02/2023   ID:  Jimmy Juliana Vannie Mickey., DOB May 16, 1924, MRN [id number]  PCP:  Keren Vicenta BRAVO, MD  Cardiologist:  Redell Leiter, MD    Referring MD: Keren Vicenta BRAVO, MD    ASSESSMENT:    1. Frequent PVCs   2. On amiodarone  therapy   3. Acquired hypothyroidism   4. Primary hypertension   5. Mixed hyperlipidemia   6. Pulmonary nodules    PLAN:    In order of problems listed above:  Jimmy Grant is doing better his atrial arrhythmias well-controlled on a low-dose amiodarone  to be attempted to decrease it further but he had a breakthrough last time and clinically did not feel well continue amiodarone  200 mg daily.  Fortunately he has a very dependable PCP who checks his labs on a regular basis for liver function and manages his hypothyroidism induced by amiodarone  hyper\tension well-controlled continue his ARB Ideal lipids continue with statin   Next appointment: I will plan to see him in the office 9 months   Medication Adjustments/Labs and Tests Ordered: Current medicines are reviewed at length with the patient today.  Concerns regarding medicines are outlined above.  Orders Placed This Encounter  Procedures   EKG 12-Lead   No orders of the defined types were placed in this encounter.    History of Present Illness:    Jimmy Acree. is a 89 y.o. male with a hx of frequent symptomatic PVCs suppressed with amiodarone  and hypothyroidism due to amiodarone  hypertension and hyper anemia last seen 04/25/2023.  He also has had lung nodules and recently seen by hematology for iron deficiency anemia.he also has been seen by pulmonary for emphysema lung nodule and obstructive sleep apnea PET scan performed most consistent with resolving inflammation or infection and not consistent with density   compliance with diet, lifestyle and medications: Yes  Jimmy Grant is doing better he has lost weight he is diffusely weak and I talked about  adding fat and protein to his diet with on a daily From a cardiology perspective doing well he is tolerating his low-dose amiodarone  and is not having PVCs symptomatically edema shortness of breath palpitation or syncope and no EKG arrhythmia today Recent labs from his primary care physician are reassuring cholesterol 120 LDL 51 hemoglobin 11.3 creatinine 1.16 potassium 4.3 ALT and TSH are normal Past Medical History:  Diagnosis Date   'Light-for-dates' infant with signs of fetal malnutrition 08/19/2016   Acute respiratory disease due to COVID-19 virus 04/07/2019   Anemia 10/17/2017   Branch retinal vein occlusion    Cardiac arrhythmia 07/23/2016   Centrilobular emphysema (HCC) 04/24/2023   Choroidal nevus, right 11/13/2017   Dyslipidemia    Edema of both legs    Frequent PVCs 09/25/2020   Glaucoma    History of prostate cancer    Hypercholesterolemia 06/08/2018   Hypertension 07/23/2016   Intermediate stage nonexudative age-related macular degeneration of both eyes 02/01/2021   Iritis, recurrent, bilateral 12/09/2013   Iron deficiency anemia 03/20/2023   Late effects of CVA (cerebrovascular accident)    Leukopenia 10/17/2017   Lumbar disc narrowing    Macular edema 07/16/2012   Malignant neoplasm of prostate (HCC) 03/17/2020   Mixed hyperlipidemia 06/02/2020   OSA on CPAP    Osteoarthritis of right glenohumeral joint    PAC (premature atrial contraction) 06/02/2020   Primary hypertension 06/02/2020   Primary open angle glaucoma (POAG) of both eyes, moderate stage 07/16/2012   Prostate  cancer [facility])    PSVT (paroxysmal supraventricular tachycardia) (HCC) 08/12/2005   Pulmonary nodules 04/24/2023   PVC (premature ventricular contraction) 06/02/2020   Status post intraocular lens implant 07/16/2012   Thyroid  nodule    Vitamin B12 deficiency     Current Medications: Current Meds  Medication Sig   amiodarone  (PACERONE ) 200 MG tablet Take 1 tablet (200 mg total) by mouth daily.    aspirin  EC 81 MG tablet Take 81 mg by mouth daily.   Azelastine  HCl 0.15 % SOLN Place 2 sprays into both nostrils daily.   calcium-vitamin D  (OSCAL WITH D) 500-200 MG-UNIT tablet Take 1 tablet by mouth daily.   DIOVAN  160 MG tablet Take 160 mg by mouth 2 (two) times daily.    dorzolamide -timolol  (COSOPT ) 22.3-6.8 MG/ML ophthalmic solution Place 1 drop into both eyes 2 (two) times daily.   folic acid  (FOLVITE ) 800 MCG tablet Take 800 mcg by mouth daily.   Glucosamine-Chondroitin (GLUCOSAMINE CHONDR COMPLEX PO) Take 1 tablet by mouth daily.   latanoprost (XALATAN) 0.005 % ophthalmic solution Place 1 drop into both eyes at bedtime.   levothyroxine  (SYNTHROID ) 125 MCG tablet Take 125 mcg by mouth daily.   Magnesium  200 MG TABS Take 1 tablet (200 mg total) by mouth daily.   Multiple Vitamins-Minerals (PRESERVISION AREDS 2+MULTI VIT) CAPS Take 1 capsule by mouth 2 (two) times daily.   simvastatin  (ZOCOR ) 20 MG tablet Take 20 mg by mouth every evening.      EKGs/Labs/Other Studies Reviewed:    The following studies were reviewed today:  Cardiac Studies & Procedures   ______________________________________________________________________________________________     ECHOCARDIOGRAM  ECHOCARDIOGRAM COMPLETE 02/13/2023  Narrative ECHOCARDIOGRAM REPORT    Patient Name:   Jimmy Cowgill. Date of Exam: 02/13/2023 Medical Rec #:  [id number]                  Height:       71.6 in Accession #:    [id number]                 Weight:       169.4 lb Date of Birth:  Oct 29, 1924                  BSA:          1.978 m Patient Age:    89 years                   BP:           114/54 mmHg Patient Gender: M                          HR:           61 bpm. Exam Location:  [facility]  Procedure: 2D Echo, Cardiac Doppler, Color Doppler and Strain Analysis  Indications:    Frequent PVCs [I49.3 (ICD-10-CM)]; On amiodarone  therapy [Z79.899 (ICD-10-CM)]; Acquired hypothyroidism [E03.9 (ICD-10-CM)];  Primary hypertension [I10 (ICD-10-CM)]; Mixed hyperlipidemia [E78.2 (ICD-10-CM)]  History:        Patient has prior history of Echocardiogram examinations, most recent 07/03/2020.  Sonographer:    Lynwood Silvas RDCS Referring Phys: [id number] Jimmy Grant J Falesha Schommer  IMPRESSIONS   1. Left ventricular ejection fraction, by estimation, is 55 to 60%. The left ventricle has normal function. The left ventricle has no regional wall motion abnormalities. Left ventricular diastolic parameters are consistent with Grade I diastolic dysfunction (impaired relaxation). The average left ventricular global  longitudinal strain is -12.7 %. The global longitudinal strain is abnormal. 2. Right ventricular systolic function is normal. The right ventricular size is normal. There is normal pulmonary artery systolic pressure. 3. The mitral valve is grossly normal. No evidence of mitral valve regurgitation. No evidence of mitral stenosis. 4. The aortic valve is tricuspid. There is mild thickening of the aortic valve. Aortic valve regurgitation is not visualized. No aortic stenosis is present. 5. The inferior vena cava is normal in size with greater than 50% respiratory variability, suggesting right atrial pressure of 3 mmHg. 6. Descending aorta diameter 2.3cm on subcostal view.  Comparison(s): A prior study was performed on 07/03/2020. No significant change from prior study.  FINDINGS Left Ventricle: Left ventricular ejection fraction, by estimation, is 55 to 60%. The left ventricle has normal function. The left ventricle has no regional wall motion abnormalities. The average left ventricular global longitudinal strain is -12.7 %. The global longitudinal strain is abnormal. The left ventricular internal cavity size was normal in size. There is no left ventricular hypertrophy. Left ventricular diastolic parameters are consistent with Grade I diastolic dysfunction (impaired relaxation).  Right Ventricle: The right ventricular size  is normal. Right vetricular wall thickness was not well visualized. Right ventricular systolic function is normal. There is normal pulmonary artery systolic pressure. The tricuspid regurgitant velocity is 1.48 m/s, and with an assumed right atrial pressure of 3 mmHg, the estimated right ventricular systolic pressure is 11.8 mmHg.  Left Atrium: Left atrial size was normal in size.  Right Atrium: Right atrial size was normal in size.  Pericardium: There is no evidence of pericardial effusion.  Mitral Valve: The mitral valve is grossly normal. No evidence of mitral valve regurgitation. No evidence of mitral valve stenosis.  Tricuspid Valve: The tricuspid valve is not well visualized. Tricuspid valve regurgitation is trivial.  Aortic Valve: The aortic valve is tricuspid. There is mild thickening of the aortic valve. Aortic valve regurgitation is not visualized. No aortic stenosis is present.  Pulmonic Valve: The pulmonic valve was not well visualized. Pulmonic valve regurgitation is trivial. No evidence of pulmonic stenosis.  Aorta: The aortic root and ascending aorta are structurally normal, with no evidence of dilitation.  Venous: The inferior vena cava is normal in size with greater than 50% respiratory variability, suggesting right atrial pressure of 3 mmHg.  IAS/Shunts: The interatrial septum was not well visualized.   LEFT VENTRICLE PLAX 2D LVIDd:         4.50 cm     Diastology LVIDs:         3.90 cm     LV e' medial:    4.90 cm/s LV PW:         0.90 cm     LV E/e' medial:  10.2 LV IVS:        1.10 cm     LV e' lateral:   5.34 cm/s LVOT diam:     2.00 cm     LV E/e' lateral: 9.3 LV SV:         62 LV SV Index:   31          2D Longitudinal Strain LVOT Area:     3.14 cm    2D Strain GLS Avg:     -12.7 %  LV Volumes (MOD) LV vol d, MOD A2C: 65.7 ml LV vol d, MOD A4C: 43.3 ml LV vol s, MOD A2C: 36.5 ml LV vol s, MOD A4C: 27.2 ml LV SV MOD A2C:  29.2 ml LV SV MOD A4C:      43.3 ml LV SV MOD BP:      24.0 ml  RIGHT VENTRICLE            IVC RV Basal diam:  3.00 cm    IVC diam: 2.10 cm RV S prime:     9.73 cm/s TAPSE (M-mode): 1.4 cm  LEFT ATRIUM             Index        RIGHT ATRIUM           Index LA diam:        2.20 cm 1.11 cm/m   RA Area:     11.50 cm LA Vol (A2C):   48.5 ml 24.53 ml/m  RA Volume:   30.20 ml  15.27 ml/m LA Vol (A4C):   33.7 ml 17.04 ml/m LA Biplane Vol: 44.0 ml 22.25 ml/m AORTIC VALVE LVOT Vmax:   97.95 cm/s LVOT Vmean:  62.100 cm/s LVOT VTI:    0.197 m  AORTA Ao Root diam: 3.00 cm Ao Asc diam:  3.50 cm Ao Desc diam: 2.20 cm  MV E velocity: 49.80 cm/s  TRICUSPID VALVE MV A velocity: 78.10 cm/s  TR Peak grad:   8.8 mmHg MV E/A ratio:  0.64        TR Vmax:        148.00 cm/s  SHUNTS Systemic VTI:  0.20 m Systemic Diam: 2.00 cm  Sreedhar reddy Madireddy Electronically signed by Alean reddy Madireddy Signature Date/Time: 02/13/2023/1:03:01 PM    Final    MONITORS  LONG TERM MONITOR (3-14 DAYS) 02/11/2023  Narrative Patch Wear Time:  7 days and 0 hours (2024-10-24T10:32:08-398 to 2024-10-31T11:06:04-398)  Patient had a min HR of 47 bpm, max HR of 96 bpm, and avg HR of 65 bpm. Predominant underlying rhythm was Sinus Rhythm.  There were 48 triggered events all sinus rhythm and associated with PVCs generally frequent often bigeminy trigeminy couplets and 1 brief episode of accelerated idioventricular rhythm.  There were no pauses of 3 seconds or greater no episodes of second or third-degree AV nodal block.  There are no episodes of atrial fibrillation or flutter.  Idioventricular Rhythm was present. Idioventricular Rhythm was detected within +/- 45 seconds of symptomatic patient event(s).  Isolated SVEs were rare (<1.0%), SVE Couplets were rare (<1.0%), and no SVE Triplets were present.  Isolated VEs were occasional (4.1%, [id number]), VE Couplets were occasional (2.7%, 8397), and VE Triplets were rare (<1.0%,  3). Ventricular Bigeminy and Trigeminy were present.       ______________________________________________________________________________________________          Recent Labs: 11/27/2023: ALT 11; BUN 16; Creatinine 1.19; Hemoglobin 11.6; Platelet Count 116; Potassium 4.1; Sodium 137  Recent Lipid Panel    Component Value Date/Time   TRIG 124 04/07/2019 1039    Physical Exam:    VS:  BP (!) 122/50   Pulse 71   Ht 5' 11 (1.803 m)   Wt 158 lb 9.6 oz (71.9 kg)   SpO2 97%   BMI 22.12 kg/m     Wt Readings from Last 3 Encounters:  12/02/23 158 lb 9.6 oz (71.9 kg)  11/27/23 155 lb (70.3 kg)  11/26/23 157 lb (71.2 kg)     GEN: He looks frail appears his age well nourished, well developed in no acute distress HEENT: Normal NECK: No JVD; No carotid bruits LYMPHATICS: No lymphadenopathy CARDIAC: RRR, no murmurs, rubs, gallops RESPIRATORY:  Clear  to auscultation without rales, wheezing or rhonchi  ABDOMEN: Soft, non-tender, non-distended MUSCULOSKELETAL:  No edema; No deformity  SKIN: Warm and dry NEUROLOGIC:  Alert and oriented x 3 PSYCHIATRIC:  Normal affect    Signed, Redell Leiter, MD  12/02/2023 1:17 PM    [facility]

## 2023-12-02 ENCOUNTER — Encounter: Payer: Self-pay | Admitting: Cardiology

## 2023-12-02 ENCOUNTER — Ambulatory Visit: Attending: Cardiology | Admitting: Cardiology

## 2023-12-02 VITALS — BP 122/50 | HR 71 | Ht 71.0 in | Wt 158.6 lb

## 2023-12-02 DIAGNOSIS — E039 Hypothyroidism, unspecified: Secondary | ICD-10-CM | POA: Diagnosis not present

## 2023-12-02 DIAGNOSIS — Z79899 Other long term (current) drug therapy: Secondary | ICD-10-CM | POA: Diagnosis not present

## 2023-12-02 DIAGNOSIS — I493 Ventricular premature depolarization: Secondary | ICD-10-CM

## 2023-12-02 DIAGNOSIS — R918 Other nonspecific abnormal finding of lung field: Secondary | ICD-10-CM

## 2023-12-02 DIAGNOSIS — I10 Essential (primary) hypertension: Secondary | ICD-10-CM | POA: Diagnosis not present

## 2023-12-02 DIAGNOSIS — E782 Mixed hyperlipidemia: Secondary | ICD-10-CM

## 2023-12-02 NOTE — Patient Instructions (Signed)
 Medication Instructions:  No medication changes were made at this visit. Continue current regimen.   *If you need a refill on your cardiac medications before your next appointment, please call your pharmacy*  Lab Work: None ordered today. If you have labs (blood work) drawn today and your tests are completely normal, you will receive your results only by: MyChart Message (if you have MyChart) OR A paper copy in the mail If you have any lab test that is abnormal or we need to change your treatment, we will call you to review the results.  Testing/Procedures: None ordered today.  Follow-Up: At [facility], you and your health needs are our priority.  As part of our continuing mission to provide you with exceptional heart care, our providers are all part of one team.  This team includes your primary Cardiologist (physician) and Advanced Practice Providers or APPs (Physician Assistants and Nurse Practitioners) who all work together to provide you with the care you need, when you need it.  Your next appointment:   9 month(s)  Provider:   Redell Leiter, MD

## 2023-12-12 DIAGNOSIS — M5412 Radiculopathy, cervical region: Secondary | ICD-10-CM | POA: Diagnosis not present

## 2023-12-12 DIAGNOSIS — G5601 Carpal tunnel syndrome, right upper limb: Secondary | ICD-10-CM | POA: Diagnosis not present

## 2023-12-22 ENCOUNTER — Other Ambulatory Visit: Payer: Self-pay

## 2023-12-25 DIAGNOSIS — D519 Vitamin B12 deficiency anemia, unspecified: Secondary | ICD-10-CM | POA: Diagnosis not present

## 2024-01-20 DIAGNOSIS — Z23 Encounter for immunization: Secondary | ICD-10-CM | POA: Diagnosis not present

## 2024-04-02 ENCOUNTER — Telehealth: Payer: Self-pay | Admitting: Cardiology

## 2024-04-02 ENCOUNTER — Ambulatory Visit

## 2024-04-02 VITALS — BP 110/58 | Ht 71.0 in | Wt 152.0 lb

## 2024-04-02 DIAGNOSIS — I471 Supraventricular tachycardia, unspecified: Secondary | ICD-10-CM

## 2024-04-02 NOTE — Progress Notes (Signed)
" ° °  Nurse Visit   Date of Encounter: 04/02/2024 ID: Lamar Juliana Vannie Mickey., DOB 02-02-1925, MRN [id number]  PCP:  Keren Vicenta BRAVO, MD   [facility] Providers Cardiologist:  None      Visit Details   VS:  BP (!) 110/58 (BP Location: Left Arm, Patient Position: Sitting)   Ht 5' 11 (1.803 m)   Wt 152 lb (68.9 kg)   SpO2 95%   BMI 21.20 kg/m  , BMI Body mass index is 21.2 kg/m.  Wt Readings from Last 3 Encounters:  04/02/24 152 lb (68.9 kg)  12/02/23 158 lb 9.6 oz (71.9 kg)  11/27/23 155 lb (70.3 kg)     Reason for visit: Irregular HR per Pt started 04-01-24 Performed today: Vitals, EKG, consulted Dr. Liborio Changes (medications, testing, etc.) : No changes- Follow up with Dr. Monetta- first avail- Monday 04-05-24 Length of Visit: 20 minutes    Medications Adjustments/Labs and Tests Ordered: Orders Placed This Encounter  Procedures   EKG 12-Lead   No orders of the defined types were placed in this encounter.    Signed, Olam JONETTA Lesches, RN  04/02/2024 10:28 AM  "

## 2024-04-02 NOTE — Telephone Encounter (Signed)
 Patient c/o Palpitations:  STAT if patient reporting lightheadedness, shortness of breath, or chest pain  How long have you had palpitations/irregular HR/ Afib? Are you having the symptoms now? Started yesterday 04/01/24, Yes PT is experiencing heart palps  Are you currently experiencing lightheadedness, SOB or CP? No  Do you have a history of afib (atrial fibrillation) or irregular heart rhythm? Yes  Have you checked your BP or HR? (document readings if available): No  Are you experiencing any other symptoms? No

## 2024-04-02 NOTE — Telephone Encounter (Signed)
 Spoke with pt. He reported having irregular HR starting yesterday. He is on Amiodarone  200 daily. Per Dr. Liborio pt to come for EKG, Vitals for evaluation. Sent to front desk for appt.

## 2024-04-04 NOTE — Progress Notes (Unsigned)
 " Cardiology Office Note:    Date:  04/05/2024   ID:  Jimmy Grant., DOB Aug 30, 1924, MRN [id number]  PCP:  Keren Vicenta BRAVO, MD  Cardiologist:  Redell Leiter, MD    Referring MD: Keren Vicenta BRAVO, MD he has lost approximately 40 pounds in the last few years and with his PVCs he may benefit from reducing his thyroid  supplement from 0.125-0.100 Synthroid  daily.   ASSESSMENT:    1. Frequent PVCs   2. On amiodarone  therapy   3. Acquired hypothyroidism   4. Primary hypertension   5. Mixed hyperlipidemia    PLAN:    In order of problems listed above:  He became aware of PVCs mostly by feeling his pulse prompting an office visit and today's follow-up I think he is stable and I continue his low-dose amiodarone  For him reassurance he responded well I am concerned with his weight loss and palpitation and a normal TSH and he may have excessive thyroid  repletion ask his PCP to consider reducing the dose.  Typically with heart disease he like to see an upper range normal or mildly increased TSH Well-controlled hypertension continue his ARB Continue his statin   Next appointment: 6 months   Medication Adjustments/Labs and Tests Ordered: Current medicines are reviewed at length with the patient today.  Concerns regarding medicines are outlined above.  No orders of the defined types were placed in this encounter.  No orders of the defined types were placed in this encounter.    History of Present Illness:    Jimmy Grant. is a 89 y.o. male with a hx of frequent symptomatic PVCs suppressed with low-dose amiodarone  hypothyroidism hypertension iron deficiency anemia and hyperlipidemia last seen 12/02/2023.  With complaint of palpitation and office EKG 04/02/2024 showing sinus rhythm and PVCs.  His most recent labs with his PCP 03/04/2024 hemoglobin mildly decreased 11.9 normal MCV and platelets 179,000 creatinine 1.08 GFR 62 cc/min potassium 4.3 cholesterol 118 with  an LDL 49 TSH normal 2.82  Compliance with diet, lifestyle and medications: Yes  He had minimal symptoms of palpitation but he noticed his pulse was irregular made him apprehensive call the office was seen and sees me back in follow-up Since then the symptoms have resolved I offered him reassurance I told less the symptoms were severe I do not think he needs to call the office he also asked if he could take an extra tablet of amiodarone  from time to time and I told him that was a reasonable approach And notices TSH is normal he has lost a great deal of weight in the last year is about 40 pounds I will ask his PCP if his thyroid  dose should be decreased. No edema shortness of breath chest pain or syncope He is taking no new over-the-counter medications recent labs 03/05/1999 show potassium 4 point cholesterol 1 8 teen LDL 49 TSH is normal 2.82 Past Medical History:  Diagnosis Date   'Light-for-dates' infant with signs of fetal malnutrition 08/19/2016   Acute respiratory disease due to COVID-19 virus 04/07/2019   Anemia 10/17/2017   Branch retinal vein occlusion (HCC)    Cardiac arrhythmia 07/23/2016   Centrilobular emphysema (HCC) 04/24/2023   Choroidal nevus, right 11/13/2017   Dyslipidemia    Edema of both legs    Frequent PVCs 09/25/2020   Glaucoma    History of prostate cancer    Hypercholesterolemia 06/08/2018   Hypertension 07/23/2016   Intermediate stage nonexudative age-related macular degeneration of both eyes  02/01/2021   Iritis, recurrent, bilateral 12/09/2013   Iron deficiency anemia 03/20/2023   Late effects of CVA (cerebrovascular accident)    Leukopenia 10/17/2017   Lumbar disc narrowing    Macular edema 07/16/2012   Malignant neoplasm of prostate (HCC) 03/17/2020   Mixed hyperlipidemia 06/02/2020   OSA on CPAP    Osteoarthritis of right glenohumeral joint    PAC (premature atrial contraction) 06/02/2020   Primary hypertension 06/02/2020   Primary open angle  glaucoma (POAG) of both eyes, moderate stage 07/16/2012   Prostate cancer (HCC)    PSVT (paroxysmal supraventricular tachycardia) 08/12/2005   Pulmonary nodules 04/24/2023   PVC (premature ventricular contraction) 06/02/2020   Status post intraocular lens implant 07/16/2012   Thyroid  nodule    Vitamin B12 deficiency     Current Medications: Active Medications[1]    EKGs/Labs/Other Studies Reviewed:    The following studies were reviewed today:  Cardiac Studies & Procedures   ______________________________________________________________________________________________     ECHOCARDIOGRAM  ECHOCARDIOGRAM COMPLETE 02/13/2023  Narrative ECHOCARDIOGRAM REPORT    Patient Name:   Jimmy Grant. Date of Exam: 02/13/2023 Medical Rec #:  [id number]                  Height:       71.6 in Accession #:    [id number]                 Weight:       169.4 lb Date of Birth:  May 23, 1924                  BSA:          1.978 m Patient Age:    89 years                   BP:           114/54 mmHg Patient Gender: M                          HR:           61 bpm. Exam Location:  [facility]  Procedure: 2D Echo, Cardiac Doppler, Color Doppler and Strain Analysis  Indications:    Frequent PVCs [I49.3 (ICD-10-CM)]; On amiodarone  therapy [Z79.899 (ICD-10-CM)]; Acquired hypothyroidism [E03.9 (ICD-10-CM)]; Primary hypertension [I10 (ICD-10-CM)]; Mixed hyperlipidemia [E78.2 (ICD-10-CM)]  History:        Patient has prior history of Echocardiogram examinations, most recent 07/03/2020.  Sonographer:    Lynwood Silvas RDCS Referring Phys: [id number] Breyonna Nault J Cullen Lahaie  IMPRESSIONS   1. Left ventricular ejection fraction, by estimation, is 55 to 60%. The left ventricle has normal function. The left ventricle has no regional wall motion abnormalities. Left ventricular diastolic parameters are consistent with Grade I diastolic dysfunction (impaired relaxation). The average left ventricular global  longitudinal strain is -12.7 %. The global longitudinal strain is abnormal. 2. Right ventricular systolic function is normal. The right ventricular size is normal. There is normal pulmonary artery systolic pressure. 3. The mitral valve is grossly normal. No evidence of mitral valve regurgitation. No evidence of mitral stenosis. 4. The aortic valve is tricuspid. There is mild thickening of the aortic valve. Aortic valve regurgitation is not visualized. No aortic stenosis is present. 5. The inferior vena cava is normal in size with greater than 50% respiratory variability, suggesting right atrial pressure of 3 mmHg. 6. Descending aorta diameter 2.3cm on subcostal view.  Comparison(s): A prior study was  performed on 07/03/2020. No significant change from prior study.  FINDINGS Left Ventricle: Left ventricular ejection fraction, by estimation, is 55 to 60%. The left ventricle has normal function. The left ventricle has no regional wall motion abnormalities. The average left ventricular global longitudinal strain is -12.7 %. The global longitudinal strain is abnormal. The left ventricular internal cavity size was normal in size. There is no left ventricular hypertrophy. Left ventricular diastolic parameters are consistent with Grade I diastolic dysfunction (impaired relaxation).  Right Ventricle: The right ventricular size is normal. Right vetricular wall thickness was not well visualized. Right ventricular systolic function is normal. There is normal pulmonary artery systolic pressure. The tricuspid regurgitant velocity is 1.48 m/s, and with an assumed right atrial pressure of 3 mmHg, the estimated right ventricular systolic pressure is 11.8 mmHg.  Left Atrium: Left atrial size was normal in size.  Right Atrium: Right atrial size was normal in size.  Pericardium: There is no evidence of pericardial effusion.  Mitral Valve: The mitral valve is grossly normal. No evidence of mitral valve  regurgitation. No evidence of mitral valve stenosis.  Tricuspid Valve: The tricuspid valve is not well visualized. Tricuspid valve regurgitation is trivial.  Aortic Valve: The aortic valve is tricuspid. There is mild thickening of the aortic valve. Aortic valve regurgitation is not visualized. No aortic stenosis is present.  Pulmonic Valve: The pulmonic valve was not well visualized. Pulmonic valve regurgitation is trivial. No evidence of pulmonic stenosis.  Aorta: The aortic root and ascending aorta are structurally normal, with no evidence of dilitation.  Venous: The inferior vena cava is normal in size with greater than 50% respiratory variability, suggesting right atrial pressure of 3 mmHg.  IAS/Shunts: The interatrial septum was not well visualized.   LEFT VENTRICLE PLAX 2D LVIDd:         4.50 cm     Diastology LVIDs:         3.90 cm     LV e' medial:    4.90 cm/s LV PW:         0.90 cm     LV E/e' medial:  10.2 LV IVS:        1.10 cm     LV e' lateral:   5.34 cm/s LVOT diam:     2.00 cm     LV E/e' lateral: 9.3 LV SV:         62 LV SV Index:   31          2D Longitudinal Strain LVOT Area:     3.14 cm    2D Strain GLS Avg:     -12.7 %  LV Volumes (MOD) LV vol d, MOD A2C: 65.7 ml LV vol d, MOD A4C: 43.3 ml LV vol s, MOD A2C: 36.5 ml LV vol s, MOD A4C: 27.2 ml LV SV MOD A2C:     29.2 ml LV SV MOD A4C:     43.3 ml LV SV MOD BP:      24.0 ml  RIGHT VENTRICLE            IVC RV Basal diam:  3.00 cm    IVC diam: 2.10 cm RV S prime:     9.73 cm/s TAPSE (M-mode): 1.4 cm  LEFT ATRIUM             Index        RIGHT ATRIUM           Index LA diam:  2.20 cm 1.11 cm/m   RA Area:     11.50 cm LA Vol (A2C):   48.5 ml 24.53 ml/m  RA Volume:   30.20 ml  15.27 ml/m LA Vol (A4C):   33.7 ml 17.04 ml/m LA Biplane Vol: 44.0 ml 22.25 ml/m AORTIC VALVE LVOT Vmax:   97.95 cm/s LVOT Vmean:  62.100 cm/s LVOT VTI:    0.197 m  AORTA Ao Root diam: 3.00 cm Ao Asc diam:  3.50  cm Ao Desc diam: 2.20 cm  MV E velocity: 49.80 cm/s  TRICUSPID VALVE MV A velocity: 78.10 cm/s  TR Peak grad:   8.8 mmHg MV E/A ratio:  0.64        TR Vmax:        148.00 cm/s  SHUNTS Systemic VTI:  0.20 m Systemic Diam: 2.00 cm  Sreedhar reddy Madireddy Electronically signed by Alean reddy Madireddy Signature Date/Time: 02/13/2023/1:03:01 PM    Final    MONITORS  LONG TERM MONITOR (3-14 DAYS) 02/11/2023  Narrative Patch Wear Time:  7 days and 0 hours (2024-10-24T10:32:08-398 to 2024-10-31T11:06:04-398)  Patient had a min HR of 47 bpm, max HR of 96 bpm, and avg HR of 65 bpm. Predominant underlying rhythm was Sinus Rhythm.  There were 48 triggered events all sinus rhythm and associated with PVCs generally frequent often bigeminy trigeminy couplets and 1 brief episode of accelerated idioventricular rhythm.  There were no pauses of 3 seconds or greater no episodes of second or third-degree AV nodal block.  There are no episodes of atrial fibrillation or flutter.  Idioventricular Rhythm was present. Idioventricular Rhythm was detected within +/- 45 seconds of symptomatic patient event(s).  Isolated SVEs were rare (<1.0%), SVE Couplets were rare (<1.0%), and no SVE Triplets were present.  Isolated VEs were occasional (4.1%, [id number]), VE Couplets were occasional (2.7%, 8397), and VE Triplets were rare (<1.0%, 3). Ventricular Bigeminy and Trigeminy were present.       ______________________________________________________________________________________________          Recent Labs: 11/27/2023: ALT 11; BUN 16; Creatinine 1.19; Hemoglobin 11.6; Platelet Count 116; Potassium 4.1; Sodium 137  Recent Lipid Panel    Component Value Date/Time   TRIG 124 04/07/2019 1039    Physical Exam:    VS:  BP (!) 128/58   Pulse 72   Ht 5' 11 (1.803 m)   Wt 155 lb 3.2 oz (70.4 kg)   SpO2 97%   BMI 21.65 kg/m     Wt Readings from Last 3 Encounters:  04/05/24 155 lb 3.2 oz  (70.4 kg)  04/02/24 152 lb (68.9 kg)  12/02/23 158 lb 9.6 oz (71.9 kg)     GEN: He has lost a significant amount of weight beginning to look frail well nourished, well developed in no acute distress HEENT: Normal NECK: No JVD; No carotid bruits LYMPHATICS: No lymphadenopathy CARDIAC: RRR, no murmurs, rubs, gallops RESPIRATORY:  Clear to auscultation without rales, wheezing or rhonchi  ABDOMEN: Soft, non-tender, non-distended MUSCULOSKELETAL:  No edema; No deformity  SKIN: Warm and dry NEUROLOGIC:  Alert and oriented x 3 PSYCHIATRIC:  Normal affect    Signed, Redell Leiter, MD  04/05/2024 3:45 PM    [facility]      [1]  Current Meds  Medication Sig   amiodarone  (PACERONE ) 200 MG tablet TAKE 1 TABLET BY MOUTH DAILY.   aspirin  EC 81 MG tablet Take 81 mg by mouth daily.   Azelastine  HCl 0.15 % SOLN Place 2 sprays  into both nostrils daily.   calcium-vitamin D  (OSCAL WITH D) 500-200 MG-UNIT tablet Take 1 tablet by mouth daily.   DIOVAN  160 MG tablet Take 160 mg by mouth 2 (two) times daily.    dorzolamide -timolol  (COSOPT ) 22.3-6.8 MG/ML ophthalmic solution Place 1 drop into both eyes 2 (two) times daily.   folic acid  (FOLVITE ) 800 MCG tablet Take 800 mcg by mouth daily.   Glucosamine-Chondroitin (GLUCOSAMINE CHONDR COMPLEX PO) Take 1 tablet by mouth daily.   latanoprost (XALATAN) 0.005 % ophthalmic solution Place 1 drop into both eyes at bedtime.   levothyroxine  (SYNTHROID ) 125 MCG tablet Take 125 mcg by mouth daily.   Magnesium  200 MG TABS Take 1 tablet (200 mg total) by mouth daily.   Multiple Vitamins-Minerals (PRESERVISION AREDS 2+MULTI VIT) CAPS Take 1 capsule by mouth 2 (two) times daily.   simvastatin  (ZOCOR ) 20 MG tablet Take 20 mg by mouth every evening.   "

## 2024-04-05 ENCOUNTER — Encounter: Payer: Self-pay | Admitting: Cardiology

## 2024-04-05 ENCOUNTER — Ambulatory Visit: Attending: Cardiology | Admitting: Cardiology

## 2024-04-05 VITALS — BP 128/58 | HR 72 | Ht 71.0 in | Wt 155.2 lb

## 2024-04-05 DIAGNOSIS — E782 Mixed hyperlipidemia: Secondary | ICD-10-CM

## 2024-04-05 DIAGNOSIS — I10 Essential (primary) hypertension: Secondary | ICD-10-CM

## 2024-04-05 DIAGNOSIS — I493 Ventricular premature depolarization: Secondary | ICD-10-CM

## 2024-04-05 DIAGNOSIS — Z79899 Other long term (current) drug therapy: Secondary | ICD-10-CM | POA: Diagnosis not present

## 2024-04-05 DIAGNOSIS — E039 Hypothyroidism, unspecified: Secondary | ICD-10-CM

## 2024-04-05 NOTE — Patient Instructions (Signed)

## 2024-04-09 ENCOUNTER — Telehealth: Payer: Self-pay | Admitting: Cardiology

## 2024-04-09 NOTE — Telephone Encounter (Signed)
 Pt is requesting a callback regarding him needing clarity about the early ventricular contractions that was discussed at his office visit. Please advise

## 2024-04-09 NOTE — Telephone Encounter (Signed)
 Spoke with pt who states that he now takes his Amiodarone  at 0700 and by 1000 his rate is normal and he does not have any extra beats until 2200. Pt states that the only way he knows for sure that he has the extra beats is feeling is pulse. Pt states that he does not have any sx. Pt states that my plan is to double my Amiodarone . Advised not to change his medication until Dr. Monetta advises. Pt verbalized understanding and had no additional questions.

## 2024-04-09 NOTE — Telephone Encounter (Signed)
Recommendations reviewed with pt as per Dr. Munley's note.  Pt verbalized understanding and had no additional questions.  

## 2024-05-27 ENCOUNTER — Other Ambulatory Visit

## 2024-05-27 ENCOUNTER — Ambulatory Visit: Admitting: Oncology
